# Patient Record
Sex: Female | Born: 1935 | Race: White | Hispanic: No | Marital: Single | Smoking: Light tobacco smoker
Health system: Southern US, Community
[De-identification: ages and names within clinical notes are randomized; demographics above are authoritative.]

## PROBLEM LIST (undated history)

## (undated) DIAGNOSIS — E079 Disorder of thyroid, unspecified: Secondary | ICD-10-CM

## (undated) DIAGNOSIS — I10 Essential (primary) hypertension: Secondary | ICD-10-CM

## (undated) DIAGNOSIS — E7800 Pure hypercholesterolemia, unspecified: Secondary | ICD-10-CM

## (undated) HISTORY — DX: Essential (primary) hypertension: I10

## (undated) HISTORY — DX: Disorder of thyroid, unspecified: E07.9

## (undated) HISTORY — PX: GALLBLADDER SURGERY: SHX652

## (undated) HISTORY — DX: Pure hypercholesterolemia, unspecified: E78.00

---

## 2005-08-05 ENCOUNTER — Ambulatory Visit: Payer: Self-pay | Admitting: Cardiovascular Disease

## 2005-09-09 ENCOUNTER — Ambulatory Visit (HOSPITAL_COMMUNITY): Admission: RE | Admit: 2005-09-09 | Discharge: 2005-09-09 | Payer: Self-pay | Admitting: *Deleted

## 2005-12-17 ENCOUNTER — Ambulatory Visit: Payer: Self-pay | Admitting: Internal Medicine

## 2006-01-01 ENCOUNTER — Ambulatory Visit: Payer: Self-pay | Admitting: Internal Medicine

## 2006-04-24 ENCOUNTER — Inpatient Hospital Stay: Payer: Self-pay | Admitting: Endocrinology

## 2006-04-25 ENCOUNTER — Other Ambulatory Visit: Payer: Self-pay

## 2006-07-18 ENCOUNTER — Ambulatory Visit: Payer: Self-pay | Admitting: *Deleted

## 2007-01-13 ENCOUNTER — Ambulatory Visit: Payer: Self-pay | Admitting: Internal Medicine

## 2007-07-31 ENCOUNTER — Ambulatory Visit: Payer: Self-pay | Admitting: Cardiovascular Disease

## 2008-02-10 ENCOUNTER — Ambulatory Visit: Payer: Self-pay | Admitting: Internal Medicine

## 2008-09-20 ENCOUNTER — Ambulatory Visit: Payer: Self-pay | Admitting: Cardiovascular Disease

## 2009-01-20 ENCOUNTER — Inpatient Hospital Stay: Payer: Self-pay | Admitting: Internal Medicine

## 2009-11-15 ENCOUNTER — Ambulatory Visit: Payer: Self-pay | Admitting: Gastroenterology

## 2009-11-20 LAB — PATHOLOGY REPORT

## 2009-12-01 ENCOUNTER — Ambulatory Visit: Payer: Self-pay | Admitting: Emergency Medicine

## 2009-12-08 ENCOUNTER — Inpatient Hospital Stay: Payer: Self-pay | Admitting: Emergency Medicine

## 2010-03-02 ENCOUNTER — Ambulatory Visit: Payer: Self-pay | Admitting: Internal Medicine

## 2010-03-04 ENCOUNTER — Ambulatory Visit: Payer: Self-pay | Admitting: Internal Medicine

## 2010-03-07 ENCOUNTER — Ambulatory Visit: Payer: Self-pay | Admitting: Internal Medicine

## 2010-04-04 ENCOUNTER — Ambulatory Visit: Payer: Self-pay | Admitting: Internal Medicine

## 2010-05-03 ENCOUNTER — Ambulatory Visit: Payer: Self-pay | Admitting: Internal Medicine

## 2010-06-03 ENCOUNTER — Ambulatory Visit: Payer: Self-pay | Admitting: Internal Medicine

## 2010-07-03 ENCOUNTER — Ambulatory Visit: Payer: Self-pay | Admitting: Internal Medicine

## 2010-08-03 ENCOUNTER — Ambulatory Visit: Payer: Self-pay | Admitting: Internal Medicine

## 2010-09-02 ENCOUNTER — Ambulatory Visit: Payer: Self-pay | Admitting: Internal Medicine

## 2010-10-03 ENCOUNTER — Ambulatory Visit: Payer: Self-pay | Admitting: Internal Medicine

## 2010-11-14 IMAGING — CR DG CHEST 2V
1 series · 2 of 2 positions shown · non-contrast
Comparison: none

REASON FOR EXAM: increasing shortness of breath
COMMENTS:

[Series 1: view not recorded · 0.17mm/px · 2 of 2 slices shown]
[im 1/2]
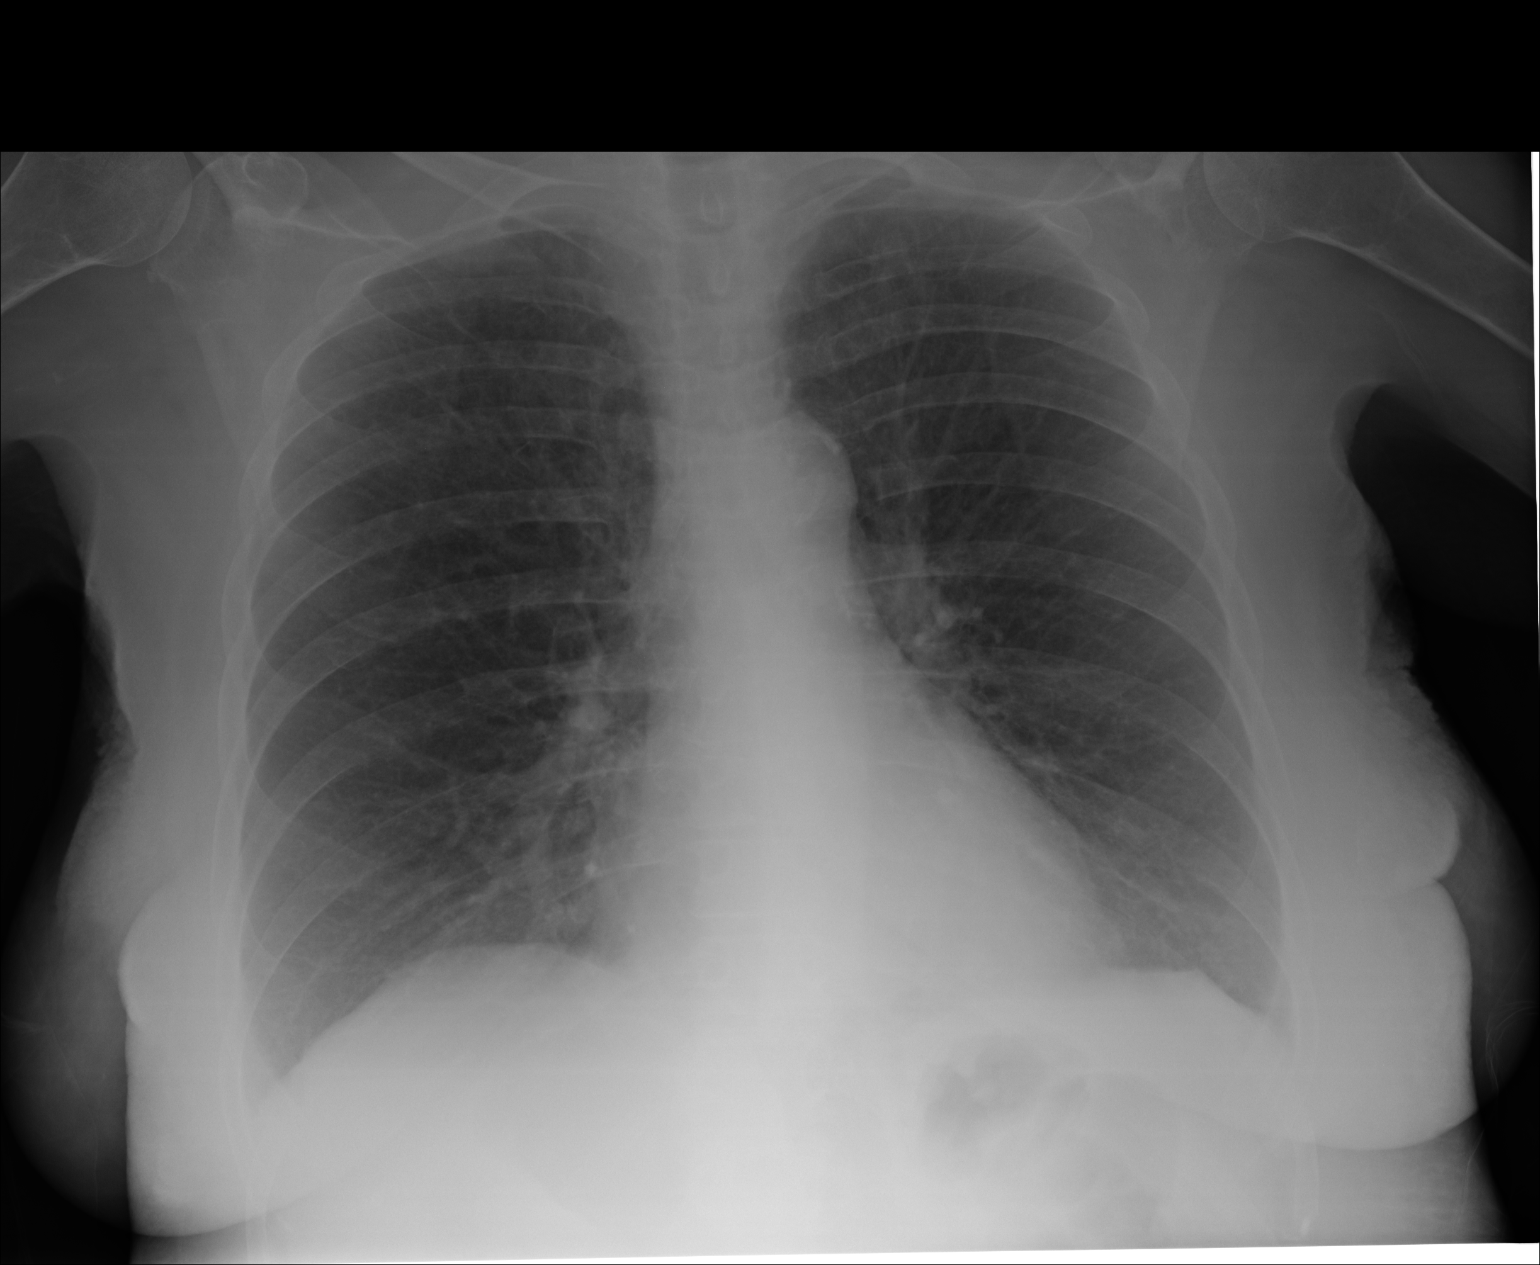
[im 2/2]
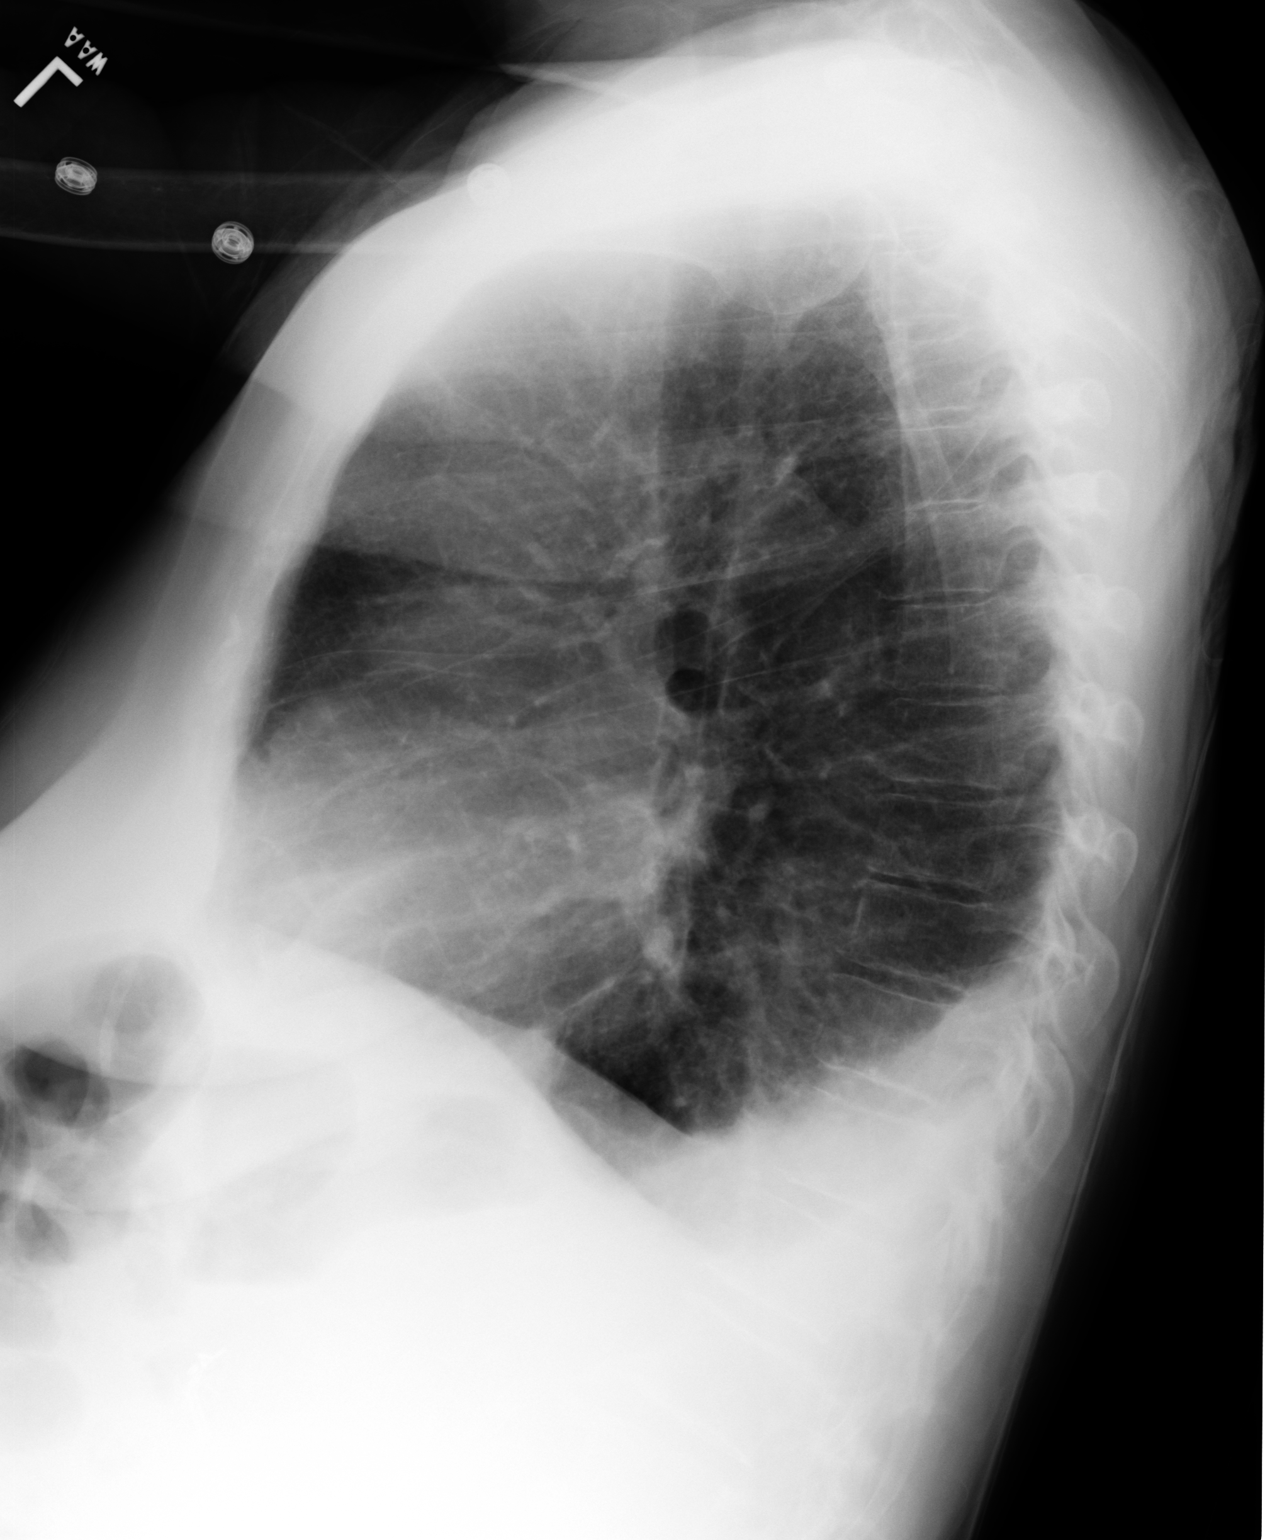

[2 of 2 positions shown; findings below may reference images not displayed]

PROCEDURE:     DXR - DXR CHEST PA (OR AP) AND LATERAL  - December 19, 2009 [DATE]

RESULT:     The lung fields are clear of infiltrate. In the lateral view
there is noted blunting of the posterior gutters bilaterally compatible with
bibasilar pleural effusions. The etiology for the effusion is not
identified. No pulmonary edema is seen. Heart size is normal. The chest is
hyperexpanded compatible with a history of COPD.
IMPRESSION: 1. There are noted bibasilar pleural effusions which blunt the posterior
gutters in the lateral view.
2. The etiology for the effusions is not identified.
3. COPD.

## 2010-11-15 IMAGING — CR DG CHEST 1V PORT
1 series · 1 of 1 positions shown · non-contrast
Comparison: none

REASON FOR EXAM: pleural effusion
COMMENTS:

[view not recorded]
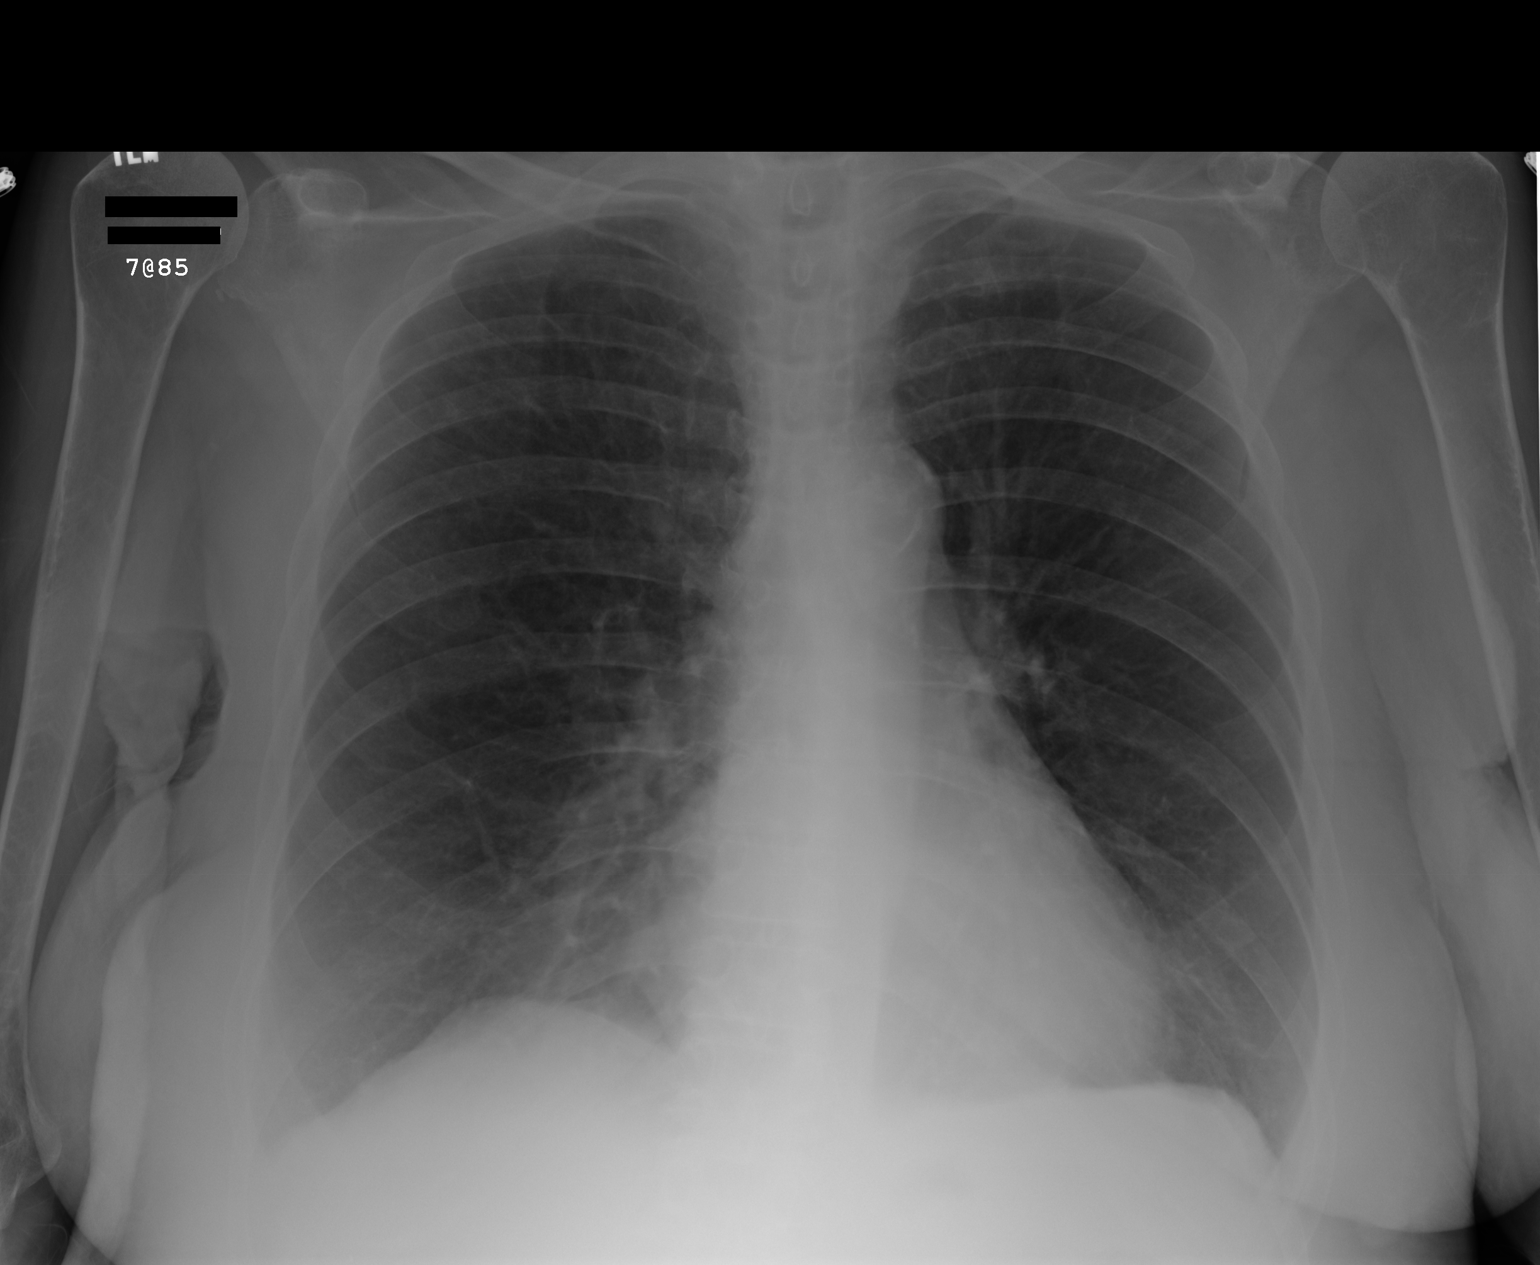

[1 of 1 positions shown; findings below may reference images not displayed]

PROCEDURE:     DXR - DXR PORTABLE CHEST SINGLE VIEW  - December 20, 2009  [DATE]

RESULT:     Comparison is made to a prior study dated 12/19/2009.

There is no evidence of focal infiltrates or regions of consolidation. There
is minimal blunting of the right costophrenic angle. The cardiac silhouette
is within normal limits. The visualized bony skeleton is unremarkable.
IMPRESSION: 1.     Blunting of the right cardiophrenic angle which is minimal. This may
represent the sequela of a small pleural effusion.
2.     Otherwise, no focal regions of consolidation or focal infiltrates.

## 2010-11-28 ENCOUNTER — Ambulatory Visit: Payer: Self-pay | Admitting: Internal Medicine

## 2010-12-03 ENCOUNTER — Ambulatory Visit: Payer: Self-pay | Admitting: Internal Medicine

## 2011-06-04 ENCOUNTER — Ambulatory Visit: Payer: Self-pay | Admitting: Ophthalmology

## 2011-06-12 ENCOUNTER — Ambulatory Visit: Payer: Self-pay | Admitting: Ophthalmology

## 2011-08-13 ENCOUNTER — Ambulatory Visit: Payer: Self-pay | Admitting: Ophthalmology

## 2011-08-13 LAB — POTASSIUM: Potassium: 3.7 mmol/L (ref 3.5–5.1)

## 2011-08-27 ENCOUNTER — Ambulatory Visit: Payer: Self-pay | Admitting: Ophthalmology

## 2013-08-11 ENCOUNTER — Encounter: Payer: Self-pay | Admitting: Podiatry

## 2013-08-11 ENCOUNTER — Ambulatory Visit (INDEPENDENT_AMBULATORY_CARE_PROVIDER_SITE_OTHER): Payer: Medicare Other | Admitting: Podiatry

## 2013-08-11 VITALS — BP 93/60 | HR 71 | Resp 16 | Ht 63.0 in | Wt 125.0 lb

## 2013-08-11 DIAGNOSIS — L600 Ingrowing nail: Secondary | ICD-10-CM

## 2013-08-11 MED ORDER — NEOMYCIN-POLYMYXIN-HC 3.5-10000-1 OT SOLN: OTIC | Status: DC

## 2013-08-11 NOTE — Patient Instructions (Signed)

## 2013-08-11 NOTE — Progress Notes (Signed)
   Subjective:    Patient ID: Bianca Horton, female    DOB: [birth year], 78 y.o.   MRN: [id number]  HPI Comments: Both great toenails are ingrown i think. They dont bother me now in the summer cause i wear open toed shoes. They about killed me this winter      Review of Systems  All other systems reviewed and are negative.      Objective:   Physical Exam: I have reviewed her past medical history medications allergies surgeries social history and review of systems. Pulses are strongly palpable bilateral. Muscle strength is 5 over 5 dorsiflexors plantar flexors inverters everters all intrinsic musculature is intact. Orthopedic evaluation demonstrates all joints distal to the ankle a full range of motion without crepitation. Cutaneous evaluation demonstrates sharp incurvated nail margin along the tibial border of the hallux bilateral.        Assessment & Plan:  Assessment: Ingrown nail paronychia abscess hallux bilateral.  Plan: Discussed etiology pathology conservative versus surgical therapies at this point in time chemical matrixectomy was performed to the tibial border of the hallux bilaterally. She tolerated this procedure well and local anesthetic and she will start treating the foot tomorrow by soaking it and applying Cortisporin Otic which was prescribed. I will followup with her in one week should she have questions concerns she'll notify us immediately.

## 2013-08-18 ENCOUNTER — Ambulatory Visit (INDEPENDENT_AMBULATORY_CARE_PROVIDER_SITE_OTHER): Payer: Medicare Other | Admitting: Podiatry

## 2013-08-18 VITALS — BP 108/63 | HR 81 | Resp 16

## 2013-08-18 DIAGNOSIS — L600 Ingrowing nail: Secondary | ICD-10-CM

## 2013-08-18 NOTE — Progress Notes (Signed)
She presents today one week status post matrixectomy hallux bilateral. She continues to soak in Betadine and water until today were she soak in Epsom salts warm water. She continues to apply the Cortisporin Otic a regular basis. And she has no pain and no drainage.  Objective: Signs are stable she is alert and oriented x3 there is no erythema edema saline is drainage or odor both margins hallux demonstrates no signs of infection and appears to be healing quite nicely.  Assessment: Well-healing surgical matrixectomy hallux bilateral.  Plan: Discontinue Betadine start with Epsom salts warm water soaks continue to soak on a twice a day basis apply Cortisporin Otic twice a day covered in the day and leave open at night continue to soak and to completely healed.

## 2014-06-26 NOTE — Op Note (Signed)
PATIENT NAME:  Bianca Horton, Bianca Horton DATE OF BIRTH:  05-15-1935  DATE OF PROCEDURE:  06/12/2011  PREOPERATIVE DIAGNOSIS: Visually significant cataract of the left eye.   POSTOPERATIVE DIAGNOSIS: Visually significant cataract of the left eye.   OPERATIVE PROCEDURE: Cataract extraction by phacoemulsification with implant of intraocular lens to left eye.   SURGEON: Galen ManilaWilliam Lasya Vetter, MD.   ANESTHESIA:  1. Managed anesthesia care.  2. Topical tetracaine drops followed by 2% Xylocaine jelly applied in the preoperative holding area.   COMPLICATIONS: None.   TECHNIQUE:  Stop and chop.   DESCRIPTION OF PROCEDURE: The patient was examined and consented in the preoperative holding area where the aforementioned topical anesthesia was applied to the left eye and then brought back to the Operating Room where the left eye was prepped and draped in the usual sterile ophthalmic fashion and a lid speculum was placed. A paracentesis was created with the side port blade and the anterior chamber was filled with viscoelastic. A near clear corneal incision was performed with the steel keratome. A continuous curvilinear capsulorrhexis was performed with a cystotome followed by the capsulorrhexis forceps. Hydrodissection and hydrodelineation were carried out with BSS on a blunt cannula. The lens was removed in a stop and chop technique and the remaining cortical material was removed with the irrigation-aspiration handpiece. The capsular bag was inflated with viscoelastic and the Technis ZCB00 22.5-diopter lens, serial number [id number] was placed in the capsular bag without complication. The remaining viscoelastic was removed from the eye with the irrigation-aspiration handpiece. The wounds were hydrated. The anterior chamber was flushed with Miostat and the eye was inflated to physiologic pressure. The wounds were found to be water tight. The eye was dressed with Vigamox. The patient was given protective  glasses to wear throughout the day and a shield with which to sleep tonight. The patient was also given drops with which to begin a drop regimen today and will follow-up with me in one day.   ____________________________ Jerilee FieldWilliam L. Ayaansh Smail, MD wlp:cms D: 06/12/2011 11:34:53 ET T: 06/12/2011 12:00:09 ET JOB#: [id number]  cc: Brayla Pat L. Demontae Antunes, MD, <Dictator> Jerilee FieldWILLIAM L Mikai Meints MD ELECTRONICALLY SIGNED 06/18/2011 12:25

## 2014-06-26 NOTE — Op Note (Signed)
PATIENT NAME:  Bianca ShipperJAMES, [name] MR#:  [id number] DATE OF BIRTH:  1935/05/21  DATE OF PROCEDURE:  08/27/2011  PREOPERATIVE DIAGNOSIS: Visually significant cataract of the right eye.   POSTOPERATIVE DIAGNOSIS: Visually significant cataract of the right eye.   OPERATIVE PROCEDURE: Cataract extraction by phacoemulsification with implant of intraocular lens to right eye.   SURGEON: Galen ManilaWilliam Nylia Gavina, MD.   ANESTHESIA:  1. Managed anesthesia care.  2. Topical tetracaine drops followed by 2% Xylocaine jelly applied in the preoperative holding area.   COMPLICATIONS: None.   TECHNIQUE:  Stop-and-chop    DESCRIPTION OF PROCEDURE: The patient was examined and consented in the preoperative holding area where the aforementioned topical anesthesia was applied to the right eye and then brought back to the Operating Room where the right eye was prepped and draped in the usual sterile ophthalmic fashion and a lid speculum was placed. A paracentesis was created with the side port blade and the anterior chamber was filled with viscoelastic. A near clear corneal incision was performed with the steel keratome. A continuous curvilinear capsulorrhexis was performed with a cystotome followed by the capsulorrhexis forceps. Hydrodissection and hydrodelineation were carried out with BSS on a blunt cannula. The lens was removed in a stop-and-chop technique and the remaining cortical material was removed with the irrigation-aspiration handpiece. The capsular bag was inflated with viscoelastic and the Technus ZCB00 21.0-diopter lens, serial number [id number] was placed in the capsular bag without complication. The remaining viscoelastic was removed from the eye with the irrigation-aspiration handpiece. The wounds were hydrated. The anterior chamber was flushed with Miostat and the eye was inflated to physiologic pressure. The wounds were found to be water tight. The eye was dressed with Vigamox. The patient was given protective  glasses to wear throughout the day and a shield with which to sleep tonight. The patient was also given drops with which to begin a drop regimen today and will follow-up with me in one day.   ____________________________ Jerilee FieldWilliam L. Vyla Pint, MD wlp:drc D: 08/27/2011 11:20:05 ET T: 08/27/2011 11:32:54 ET JOB#: [id number]  cc: Kirsi Hugh L. Dragan Tamburrino, MD, <Dictator> Jerilee FieldWILLIAM L Mahika Vanvoorhis MD ELECTRONICALLY SIGNED 08/28/2011 15:07

## 2014-09-19 ENCOUNTER — Ambulatory Visit: Payer: Self-pay | Admitting: Cardiovascular Disease

## 2015-11-24 ENCOUNTER — Ambulatory Visit (INDEPENDENT_AMBULATORY_CARE_PROVIDER_SITE_OTHER): Payer: Medicare Other | Admitting: Podiatry

## 2015-12-01 ENCOUNTER — Ambulatory Visit (INDEPENDENT_AMBULATORY_CARE_PROVIDER_SITE_OTHER): Payer: Medicare Other | Admitting: Podiatry

## 2015-12-01 ENCOUNTER — Encounter: Payer: Self-pay | Admitting: Podiatry

## 2015-12-01 VITALS — BP 184/93 | HR 69 | Resp 16

## 2015-12-01 DIAGNOSIS — S90221A Contusion of right lesser toe(s) with damage to nail, initial encounter: Secondary | ICD-10-CM | POA: Diagnosis not present

## 2015-12-01 DIAGNOSIS — B351 Tinea unguium: Secondary | ICD-10-CM

## 2015-12-01 DIAGNOSIS — L603 Nail dystrophy: Secondary | ICD-10-CM | POA: Diagnosis not present

## 2015-12-01 DIAGNOSIS — M7989 Other specified soft tissue disorders: Secondary | ICD-10-CM

## 2015-12-01 DIAGNOSIS — M79661 Pain in right lower leg: Secondary | ICD-10-CM

## 2015-12-01 DIAGNOSIS — M79674 Pain in right toe(s): Secondary | ICD-10-CM

## 2015-12-01 NOTE — Progress Notes (Signed)
Subjective: Patient presents today for trauma to the right great toenail. Patient states that she does not know what happened but she noticed that her right big toe became painful and swollen with the nail partially detached.  Patient states that today upon presentation the toenail actually appears to be good and there is no pain regarding her right great toe or toenail.   Objective: Physical Exam General: The patient is alert and oriented x3 in no acute distress.  Dermatology: Skin is warm, dry and supple bilateral lower extremities. Negative for open lesions or macerations.  Vascular: Palpable pedal pulses bilaterally. No edema or erythema noted. Capillary refill within normal limits.  Neurological: Epicritic and protective threshold grossly intact bilaterally.   Musculoskeletal Exam: Minimal pain on palpation to the right great toe with mild erythema and edema noted. Possible subungual hematoma noted to the right great toenail.  Range of motion within normal limits to all pedal and ankle joints bilateral. Muscle strength 5/5 in all groups bilateral.    Assessment: #1 trauma to the right great toenail. #2 edema with erythema right great toe #3 pain in right great toe #4 onychomycosis bilateral nails.  Problem List Items Addressed This Visit    None    Visit Diagnoses   None.       Plan of Care:  #1 Patient was evaluated. #2 Today the nail appears to be somewhat healthy and the patient is no longer in pain regarding the right great toe. #3 the nail was filed down using a rotary bur down to smooth contour. #4 patient is to return to clinic on a when necessary basis recommend daily application of antibiotic ointment and a Band-Aid.     Dr. Felecia ShellingBrent M. Valrie Jia, DPM [facility]

## 2019-08-23 ENCOUNTER — Ambulatory Visit (INDEPENDENT_AMBULATORY_CARE_PROVIDER_SITE_OTHER): Payer: Medicare Other | Admitting: Nurse Practitioner

## 2019-08-23 ENCOUNTER — Encounter (INDEPENDENT_AMBULATORY_CARE_PROVIDER_SITE_OTHER): Payer: Self-pay | Admitting: Nurse Practitioner

## 2019-08-23 ENCOUNTER — Other Ambulatory Visit: Payer: Self-pay

## 2019-08-23 VITALS — BP 97/62 | HR 81 | Resp 16 | Ht 63.0 in | Wt 138.6 lb

## 2019-08-23 DIAGNOSIS — I739 Peripheral vascular disease, unspecified: Secondary | ICD-10-CM | POA: Diagnosis not present

## 2019-08-23 DIAGNOSIS — F17200 Nicotine dependence, unspecified, uncomplicated: Secondary | ICD-10-CM

## 2019-08-23 DIAGNOSIS — I10 Essential (primary) hypertension: Secondary | ICD-10-CM

## 2019-08-23 DIAGNOSIS — M7989 Other specified soft tissue disorders: Secondary | ICD-10-CM

## 2019-08-23 DIAGNOSIS — IMO0001 Reserved for inherently not codable concepts without codable children: Secondary | ICD-10-CM

## 2019-08-23 DIAGNOSIS — I701 Atherosclerosis of renal artery: Secondary | ICD-10-CM

## 2019-08-26 ENCOUNTER — Encounter (INDEPENDENT_AMBULATORY_CARE_PROVIDER_SITE_OTHER): Payer: Self-pay | Admitting: Nurse Practitioner

## 2019-08-29 NOTE — Progress Notes (Signed)
Subjective:    Patient ID: Bianca Horton, female    DOB: Nov 17, 1935, 84 y.o.   MRN: [id number] Chief Complaint  Patient presents with  . New Patient (Initial Visit)    ref for ble swelling    Patient is seen for evaluation of leg pain and leg swelling. The patient first noticed the swelling remotely. The swelling is associated with pain and discoloration. The pain and swelling worsens with prolonged dependency and improves with elevation. The pain is unrelated to activity.  The patient notes that in the morning the legs are significantly improved but they steadily worsened throughout the course of the day. The patient also notes a steady worsening of the discoloration in the ankle and shin area.   The patient endorses weakness in the lower extremities.  She notes that she cannot walk far without her legs giving out.  The patient notes that approximately 10 years ago she had a blockage in her leg with a stent placed with repair to the renal arteries as well.  Elevation makes the leg symptoms better, dependency makes them much worse. There is no history of ulcerations. The patient denies any recent changes in medications.  The patient has not been wearing graduated compression.  The patient denies a history of DVT or PE. There is no prior history of phlebitis. There is no history of primary lymphedema.  No history of malignancies. No history of trauma or groin or pelvic surgery. There is no history of radiation treatment to the groin or pelvis  The patient denies amaurosis fugax or recent TIA symptoms. There are no recent neurological changes noted. The patient denies recent episodes of angina or shortness of breath   Review of Systems  Cardiovascular: Positive for leg swelling.  Neurological: Positive for weakness.  All other systems reviewed and are negative.      Objective:   Physical Exam Vitals reviewed.  HENT:     Head: Normocephalic.  Cardiovascular:     Rate and  Rhythm: Normal rate and regular rhythm.     Pulses: Decreased pulses.  Pulmonary:     Effort: Pulmonary effort is normal.     Breath sounds: Normal breath sounds.  Musculoskeletal:     Right lower leg: 3+ Pitting Edema present.     Left lower leg: 3+ Pitting Edema present.  Neurological:     Mental Status: She is alert and oriented to person, place, and time.     Motor: Weakness present.  Psychiatric:        Mood and Affect: Mood normal.        Behavior: Behavior normal.        Thought Content: Thought content normal.        Judgment: Judgment normal.     BP 97/62 (BP Location: Right Arm)   Pulse 81   Resp 16   Ht 5\' 3"  (1.6 m)   Wt 138 lb 9.6 oz (62.9 kg)   BMI 24.55 kg/m   Past Medical History:  Diagnosis Date  . High cholesterol   . Hypertension   . Thyroid disease     Social History   Socioeconomic History  . Marital status: Single    Spouse name: Not on file  . Number of children: Not on file  . Years of education: Not on file  . Highest education level: Not on file  Occupational History  . Not on file  Tobacco Use  . Smoking status: Light Tobacco Smoker  . Smokeless  tobacco: Never Used  . Tobacco comment: process of quitting   Substance and Sexual Activity  . Alcohol use: Never  . Drug use: Never  . Sexual activity: Not on file  Other Topics Concern  . Not on file  Social History Narrative  . Not on file   Social Determinants of Health   Financial Resource Strain:   . Difficulty of Paying Living Expenses:   Food Insecurity:   . Worried About Charity fundraiser in the Last Year:   . Arboriculturist in the Last Year:   Transportation Needs:   . Film/video editor (Medical):   Marland Kitchen Lack of Transportation (Non-Medical):   Physical Activity:   . Days of Exercise per Week:   . Minutes of Exercise per Session:   Stress:   . Feeling of Stress :   Social Connections:   . Frequency of Communication with Friends and Family:   . Frequency of  Social Gatherings with Friends and Family:   . Attends Religious Services:   . Active Member of Clubs or Organizations:   . Attends Archivist Meetings:   Marland Kitchen Marital Status:   Intimate Partner Violence:   . Fear of Current or Ex-Partner:   . Emotionally Abused:   Marland Kitchen Physically Abused:   . Sexually Abused:     Past Surgical History:  Procedure Laterality Date  . GALLBLADDER SURGERY      History reviewed. No pertinent family history.  No Known Allergies     Assessment & Plan:   1. PAD (peripheral artery disease) (HCC) It is very possible the patient's worsening weakness is related to her peripheral arterial disease.  Per independent review of the patient's outside records it appears that she did have a area of stenosis in the left upper extremity however the notes are not clear as to whether it was a vein or artery.  The patient is not entirely sure herself.  We will perform noninvasive studies to evaluate the patient's arterial status. - VAS [place] ABI WITH/WO TBI; Future  2. Leg swelling I have had a long discussion with the patient regarding swelling and why it  causes symptoms.  Patient will begin wearing graduated compression stockings class 1 (20-30 mmHg) on a daily basis a prescription was given. The patient will  beginning wearing the stockings first thing in the morning and removing them in the evening. The patient is instructed specifically not to sleep in the stockings.   In addition, behavioral modification will be initiated.  This will include frequent elevation, use of over the counter pain medications and exercise such as walking.  I have reviewed systemic causes for chronic edema such as liver, kidney and cardiac etiologies.  The patient denies problems with these organ systems.    Consideration for a lymph pump will also be made based upon the effectiveness of conservative therapy.  This would help to improve the edema control and prevent sequela such as ulcers  and infections   Patient should undergo duplex ultrasound of the venous system to ensure that DVT or reflux is not present.  The patient will follow-up with me after the ultrasound.   - VAS [place] LOWER EXTREMITY VENOUS REFLUX; Future  3. Renal artery stenosis (HCC) Currently the patient's blood pressure is adequately controlled however she does have a history of chronic kidney disease.  Once we have examined the cause of the patient's leg pain and swelling we will shift her focus to evaluating the  status of her renal artery stenosis.  4. Essential hypertension Continue antihypertensive medications as already ordered, these medications have been reviewed and there are no changes at this time.   5. Smoking Smoking cessation was discussed, 3-10 minutes spent on this topic specifically    Current Outpatient Medications on File Prior to Visit  Medication Sig Dispense Refill  . aspirin 325 MG EC tablet Take 325 mg by mouth daily.    Marland Kitchen buPROPion (WELLBUTRIN XL) 150 MG 24 hr tablet     . carvedilol (COREG) 25 MG tablet     . cilostazol (PLETAL) 100 MG tablet     . cloNIDine (CATAPRES) 0.1 MG tablet     . levothyroxine (SYNTHROID) 112 MCG tablet Take 112 mcg by mouth daily.    . Multiple Vitamins-Minerals (PRESERVISION/LUTEIN) CAPS Take by mouth.    . simvastatin (ZOCOR) 40 MG tablet     . cloNIDine (CATAPRES) 0.3 MG tablet  (Patient not taking: Reported on 08/23/2019)    . furosemide (LASIX) 20 MG tablet  (Patient not taking: Reported on 08/23/2019)    . levothyroxine (SYNTHROID, LEVOTHROID) 100 MCG tablet  (Patient not taking: Reported on 08/23/2019)    . losartan-hydrochlorothiazide (HYZAAR) 100-25 MG per tablet  (Patient not taking: Reported on 08/23/2019)    . neomycin-polymyxin-hydrocortisone (CORTISPORIN) otic solution 1-2  Drops to the toe after soaking twice daily (Patient not taking: Reported on 12/01/2015) 10 mL 1   No current facility-administered medications on file prior to visit.     There are no Patient Instructions on file for this visit. No follow-ups on file.   Georgiana Spinner, NP

## 2019-09-10 ENCOUNTER — Ambulatory Visit (INDEPENDENT_AMBULATORY_CARE_PROVIDER_SITE_OTHER): Payer: Medicare Other | Admitting: Nurse Practitioner

## 2019-09-10 ENCOUNTER — Encounter (INDEPENDENT_AMBULATORY_CARE_PROVIDER_SITE_OTHER): Payer: Medicare Other

## 2019-09-11 ENCOUNTER — Emergency Department
Admission: EM | Admit: 2019-09-11 | Discharge: 2019-09-11 | Disposition: A | Discharge status: Home / Self Care | Payer: Medicare Other | Attending: Emergency Medicine | Admitting: Emergency Medicine

## 2019-09-11 ENCOUNTER — Other Ambulatory Visit: Payer: Self-pay

## 2019-09-11 ENCOUNTER — Emergency Department: Payer: Medicare Other

## 2019-09-11 DIAGNOSIS — Y929 Unspecified place or not applicable: Secondary | ICD-10-CM | POA: Insufficient documentation

## 2019-09-11 DIAGNOSIS — M79604 Pain in right leg: Secondary | ICD-10-CM | POA: Insufficient documentation

## 2019-09-11 DIAGNOSIS — F17200 Nicotine dependence, unspecified, uncomplicated: Secondary | ICD-10-CM | POA: Insufficient documentation

## 2019-09-11 DIAGNOSIS — N189 Chronic kidney disease, unspecified: Secondary | ICD-10-CM | POA: Insufficient documentation

## 2019-09-11 DIAGNOSIS — Z79899 Other long term (current) drug therapy: Secondary | ICD-10-CM | POA: Insufficient documentation

## 2019-09-11 DIAGNOSIS — I739 Peripheral vascular disease, unspecified: Secondary | ICD-10-CM | POA: Diagnosis not present

## 2019-09-11 DIAGNOSIS — X58XXXA Exposure to other specified factors, initial encounter: Secondary | ICD-10-CM | POA: Insufficient documentation

## 2019-09-11 DIAGNOSIS — Y999 Unspecified external cause status: Secondary | ICD-10-CM | POA: Diagnosis not present

## 2019-09-11 DIAGNOSIS — I129 Hypertensive chronic kidney disease with stage 1 through stage 4 chronic kidney disease, or unspecified chronic kidney disease: Secondary | ICD-10-CM | POA: Diagnosis not present

## 2019-09-11 DIAGNOSIS — Y939 Activity, unspecified: Secondary | ICD-10-CM | POA: Insufficient documentation

## 2019-09-11 DIAGNOSIS — M5416 Radiculopathy, lumbar region: Secondary | ICD-10-CM | POA: Diagnosis not present

## 2019-09-11 DIAGNOSIS — S8991XA Unspecified injury of right lower leg, initial encounter: Secondary | ICD-10-CM | POA: Diagnosis present

## 2019-09-11 DIAGNOSIS — E876 Hypokalemia: Secondary | ICD-10-CM | POA: Diagnosis not present

## 2019-09-11 DIAGNOSIS — S80811A Abrasion, right lower leg, initial encounter: Secondary | ICD-10-CM | POA: Diagnosis not present

## 2019-09-11 LAB — CBC WITH DIFFERENTIAL/PLATELET
Abs Immature Granulocytes: 0.02 10*3/uL (ref 0.00–0.07)
Basophils Absolute: 0 10*3/uL (ref 0.0–0.1)
Basophils Relative: 0 %
Eosinophils Absolute: 0.1 10*3/uL (ref 0.0–0.5)
Eosinophils Relative: 2 %
HCT: 27.9 % — ABNORMAL LOW (ref 36.0–46.0)
Hemoglobin: 9.8 g/dL — ABNORMAL LOW (ref 12.0–15.0)
Immature Granulocytes: 0 %
Lymphocytes Relative: 27 %
Lymphs Abs: 1.9 10*3/uL (ref 0.7–4.0)
MCH: 32.2 pg (ref 26.0–34.0)
MCHC: 35.1 g/dL (ref 30.0–36.0)
MCV: 91.8 fL (ref 80.0–100.0)
Monocytes Absolute: 0.8 10*3/uL (ref 0.1–1.0)
Monocytes Relative: 12 %
Neutro Abs: 4 10*3/uL (ref 1.7–7.7)
Neutrophils Relative %: 59 %
Platelets: 215 10*3/uL (ref 150–400)
RBC: 3.04 MIL/uL — ABNORMAL LOW (ref 3.87–5.11)
RDW: 12.9 % (ref 11.5–15.5)
WBC: 6.8 10*3/uL (ref 4.0–10.5)
nRBC: 0 % (ref 0.0–0.2)

## 2019-09-11 LAB — BASIC METABOLIC PANEL
Anion gap: 11 (ref 5–15)
BUN: 23 mg/dL (ref 8–23)
CO2: 25 mmol/L (ref 22–32)
Calcium: 10.1 mg/dL (ref 8.9–10.3)
Chloride: 97 mmol/L — ABNORMAL LOW (ref 98–111)
Creatinine, Ser: 2.01 mg/dL — ABNORMAL HIGH (ref 0.44–1.00)
GFR calc Af Amer: 26 mL/min — ABNORMAL LOW (ref >60)
GFR calc non Af Amer: 23 mL/min — ABNORMAL LOW (ref >60)
Glucose, Bld: 102 mg/dL — ABNORMAL HIGH (ref 70–99)
Potassium: 3 mmol/L — ABNORMAL LOW (ref 3.5–5.1)
Sodium: 133 mmol/L — ABNORMAL LOW (ref 135–145)

## 2019-09-11 LAB — PROTIME-INR
INR: 1 (ref 0.8–1.2)
Prothrombin Time: 13.2 seconds (ref 11.4–15.2)

## 2019-09-11 MED ORDER — TRAMADOL HCL 50 MG PO TABS: 50.0000 mg | ORAL_TABLET | Freq: Three times a day (TID) | ORAL | 0 refills | Status: AC | PRN

## 2019-09-11 MED ORDER — POTASSIUM CHLORIDE CRYS ER 20 MEQ PO TBCR
40.0000 meq | EXTENDED_RELEASE_TABLET | Freq: Once | ORAL | Status: AC
  Administered 2019-09-11: 40 meq via ORAL
  Filled 2019-09-11: qty 2

## 2019-09-11 MED ORDER — PREDNISONE 20 MG PO TABS
20.0000 mg | ORAL_TABLET | Freq: Two times a day (BID) | ORAL | 0 refills | Status: AC
Start: 2019-09-11 — End: 2019-09-16

## 2019-09-11 MED ORDER — TRAMADOL HCL 50 MG PO TABS
50.0000 mg | ORAL_TABLET | Freq: Once | ORAL | Status: AC
  Administered 2019-09-11: 50 mg via ORAL
  Filled 2019-09-11: qty 1

## 2019-09-11 MED ORDER — PREDNISONE 20 MG PO TABS
60.0000 mg | ORAL_TABLET | Freq: Once | ORAL | Status: AC
  Administered 2019-09-11: 60 mg via ORAL
  Filled 2019-09-11: qty 3

## 2019-09-11 MED ORDER — POTASSIUM CHLORIDE ER 10 MEQ PO TBCR: 10.0000 meq | EXTENDED_RELEASE_TABLET | Freq: Every day | ORAL | 0 refills | Status: DC

## 2019-09-11 NOTE — ED Triage Notes (Addendum)
Pt arrives via [facility] from home for c/o right leg pain x 1 month. Pt states she is having trouble bearing weight on the right leg so she called her PCP and asked for pain medication and was told they could not give her any and to come to the ER. PT A&Ox4 and in NAD, skin warm and dry. Swelling noted to the left foot and ankle. Pt reports she slipped while going up the stairs last month but states the leg was hurting before this injury

## 2019-09-11 NOTE — ED Provider Notes (Signed)
[facility] Emergency Department Provider Note ____________________________________________  Time seen: 59  I have reviewed the triage vital signs and the nursing notes.  HISTORY  Chief Complaint  Leg Pain  HPI Bianca Horton is a 84 y.o. female presents to the ED  via EMS from home with complaints of intermittent right leg pain for the last month.  Patient reports when the pain is there she has trouble bearing weight on the right leg.  She called her PCP fast blood prescription for pain medicine, and was told to come to the ED for evaluation.  Patient denies any recent falls, but does admit to a fall about a week ago versus getting her leg concrete steps.  Denies any head injury or loss of consciousness.  Patient lives independently at home, uses a walker to ambulate.  She reports the pain has kept her less active as of late.  She does have a history of chronic kidney disease, hypertension, thyroid disease, and peripheral vascular disease.  She was recently evaluated by vascular surgery, and was to have an ultrasound performed of the lower extremities with ABIs.  Patient admittedly did not follow-up as planned, and is not had any other interventions by vascular.  She denies any bladder bowel incontinence, foot drop, or saddle anesthesias.  She also denies any chest pain, shortness of breath, fever, chills, sweats.  Patient reports some swelling to the ankles, but reports that is not the reason for her pain, no other reason for her presenting today.  Past Medical History:  Diagnosis Date  . High cholesterol   . Hypertension   . Thyroid disease     There are no problems to display for this patient.   Past Surgical History:  Procedure Laterality Date  . GALLBLADDER SURGERY      Prior to Admission medications   Medication Sig Start Date End Date Taking? Authorizing Provider  aspirin 325 MG EC tablet Take 325 mg by mouth daily.    [provider]   buPROPion (WELLBUTRIN XL) 150 MG 24 hr tablet  11/27/15   [provider]  carvedilol (COREG) 25 MG tablet  07/22/13   [provider]  cilostazol (PLETAL) 100 MG tablet  07/22/13   [provider]  cloNIDine (CATAPRES) 0.1 MG tablet  11/27/15   [provider]  cloNIDine (CATAPRES) 0.3 MG tablet  07/22/13   [provider]  furosemide (LASIX) 20 MG tablet  11/27/15   [provider]  levothyroxine (SYNTHROID) 112 MCG tablet Take 112 mcg by mouth daily. 05/07/19   [provider]  levothyroxine (SYNTHROID, LEVOTHROID) 100 MCG tablet  07/22/13   [provider]  losartan-hydrochlorothiazide Mauri Reading) 100-25 MG per tablet  07/22/13   [provider]  Multiple Vitamins-Minerals (PRESERVISION/LUTEIN) CAPS Take by mouth.    [provider]  neomycin-polymyxin-hydrocortisone (CORTISPORIN) otic solution 1-2  Drops to the toe after soaking twice daily Patient not taking: Reported on 12/01/2015 08/11/13   Hyatt, Max T, DPM  potassium chloride (KLOR-CON) 10 MEQ tablet Take 1 tablet (10 mEq total) by mouth daily for 5 days. 09/11/19 09/16/19  Jawana Reagor, Charlesetta Ivory, PA-C  predniSONE (DELTASONE) 20 MG tablet Take 1 tablet (20 mg total) by mouth 2 (two) times daily with a meal for 5 days. 09/11/19 09/16/19  Charnell Peplinski, Charlesetta Ivory, PA-C  simvastatin (ZOCOR) 40 MG tablet  07/22/13   [provider]  traMADol (ULTRAM) 50 MG tablet Take 1 tablet (50 mg total) by  mouth 3 (three) times daily as needed for up to 7 days. 09/11/19 09/18/19  Marabelle Cushman, Charlesetta Ivory, PA-C    Allergies Patient has no known allergies.  History reviewed. No pertinent family history.  Social History Social History   Tobacco Use  . Smoking status: Light Tobacco Smoker  . Smokeless tobacco: Never Used  . Tobacco comment: process of quitting   Substance Use Topics  . Alcohol use: Never  . Drug use: Never    Review of Systems  Constitutional:  Negative for fever. Cardiovascular: Negative for chest pain. Respiratory: Negative for shortness of breath. Gastrointestinal: Negative for abdominal pain, vomiting and diarrhea. Genitourinary: Negative for dysuria. Musculoskeletal: Negative for back pain. Right leg pain as above Skin: Negative for rash. Neurological: Negative for headaches, focal weakness or numbness. ____________________________________________  PHYSICAL EXAM:  VITAL SIGNS: ED Triage Vitals [09/11/19 1755]  Enc Vitals Group     BP 138/81     Pulse Rate 78     Resp 18     Temp 98.3 F (36.8 C)     Temp Source Oral     SpO2 97 %     Weight 130 lb (59 kg)     Height 5\' 3"  (1.6 m)     Head Circumference      Peak Flow      Pain Score 10     Pain Loc      Pain Edu?      Excl. in GC?     Constitutional: Alert and oriented. Well appearing and in no distress. Head: Normocephalic and atraumatic. Eyes: Conjunctivae are normal. Normal extraocular movements Cardiovascular: Normal rate, regular rhythm. Normal distal pulses and cap refill.  No CCE distally. Respiratory: Normal respiratory effort. No wheezes/rales/rhonchi. Gastrointestinal: Soft and nontender. No distention, rebound, guarding, or rigidity.  No CVA tenderness elicited.  Normoactive bowel sounds noted. Musculoskeletal: Normal spinal alignment without midline tenderness, spasm, deformity, or step-off.  Patient able demonstrate normal hip flexion the and knee flexion on exam.  Normal patella tracking.  No signs of tunnel derangement on the right.  Nontender with normal range of motion in all extremities.  Neurologic: Cranial nerves II through XII grossly intact.  Normal gait without ataxia. Normal speech and language. No gross focal neurologic deficits are appreciated. Skin:  Skin is warm, dry and intact. No rash noted.  Patient with an abrasion/skin tear to the anterior right shin. Psychiatric: Mood and affect are normal. Patient exhibits appropriate insight  and judgment. ____________________________________________   LABS (pertinent positives/negatives)  Labs Reviewed  BASIC METABOLIC PANEL - Abnormal; Notable for the following components:      Result Value   Sodium 133 (*)    Potassium 3.0 (*)    Chloride 97 (*)    Glucose, Bld 102 (*)    Creatinine, Ser 2.01 (*)    GFR calc non Af Amer 23 (*)    GFR calc Af Amer 26 (*)    All other components within normal limits  CBC WITH DIFFERENTIAL/PLATELET - Abnormal; Notable for the following components:   RBC 3.04 (*)    Hemoglobin 9.8 (*)    HCT 27.9 (*)    All other components within normal limits  PROTIME-INR  URINALYSIS, COMPLETE (UACMP) WITH MICROSCOPIC  ____________________________________________   RADIOLOGY  RLE Doppler  IMPRESSION: Negative.  DG Lumbar Spine.  IMPRESSION: 1. No definite acute fracture or vertebral body height loss. 2. Multilevel discogenic and facet degenerative changes. 3. Fusiform infrarenal abdominal aortic aneurysm  measuring up to 4.8 cm on this exam, likely increased from comparison in 2011. Consider further evaluation with ultrasound or CT angiography. 4. Additional extensive atherosclerotic plaque throughout the abdomen and pelvis. Small vascular stent in the left upper quadrant, likely fractured renal artery ____________________________________________  PROCEDURES  Ultram 50 mg p.o. x 2 Prednisone 60 mg PO K-dur 40 meq PO wound care  Procedures ____________________________________________  INITIAL IMPRESSION / ASSESSMENT AND PLAN / ED COURSE  DDX: lumbar radiculopathy, DDD/HNP, spinal compression fracture, DVT, hip fracture  Geriatric patient with ED evaluation of a 1 month complaint of intermittent right leg pain.  Patient is without fall preceding injury.  Exam is overall benign reassuring at this time.  Ultrasound study of the right lower extremity does not reveal any acute DVT or significant vascular deficiency.  X-ray of the  lumbar spine reveals multilevel DDD and facet arthropathy.  No acute compression fractures are appreciated.  Patient is a incidental finding of an increased AAA, previously seen on CT imaging.  Patient able to ambulate to the bathroom with standby assist.  She did report some increased leg cramping on the return trip.  Otherwise patient's clinical picture is likely consistent with lumbar radiculopathy as being underlying cause for her right leg pain.  She requested a dose of steroid, and this is appropriate given her symptoms we will treat her with prednisone, tramadol, and a few potassium pills for her mild hypokalemia.  Patient is encouraged to follow-up with vascular surgery for the reevaluation of her AAA.  She is also advised to see a primary provider for ongoing management of her right leg pain.  Return precautions have been reviewed.  Patient is discharged with a care of her adult niece at this time.  A request is also placed for home health evaluation for possible DME and home health aide assistance.  Bianca Horton was evaluated in Emergency Department on 09/11/2019 for the symptoms described in the history of present illness. She was evaluated in the context of the global COVID-19 pandemic, which necessitated consideration that the patient might be at risk for infection with the SARS-CoV-2 virus that causes COVID-19. Institutional protocols and algorithms that pertain to the evaluation of patients at risk for COVID-19 are in a state of rapid change based on information released by regulatory bodies including the CDC and federal and state organizations. These policies and algorithms were followed during the patient's care in the ED.  I reviewed the patient's prescription history over the last 12 months in the multi-state controlled substances database(s) that includes [place], [place], [place], [place], [place], [place], [place], [place], [place], [place], [place], [place],  [place], and [place].  Results were notable for no RX history. ____________________________________________  FINAL CLINICAL IMPRESSION(S) / ED DIAGNOSES  Final diagnoses:  Right leg pain  Lumbar radiculopathy      Karmen Stabs, Charlesetta Ivory, PA-C 09/11/19 2256    Chesley Noon, MD 09/14/19 1511

## 2019-09-11 NOTE — ED Triage Notes (Signed)
FIRST NURSE: Pt to ER via EMS with reports of leg pain for a month.  Called EMS because she could not walk on them any longer, EMS reports pt ambulatory in house.  Pt told EMS she just needs "some pain pills".

## 2019-09-11 NOTE — ED Notes (Signed)
Ultrasound at bedside

## 2019-09-11 NOTE — Discharge Instructions (Addendum)
Your exam, labs, [place], and XR are essentially normal at this time. Your leg pain is not due to a blood clot or DVT. You may be experiencing leg pain due to DDD and lumbar arthritis. Take the prescription steroid and pain medicine as directed. You are also being treated for low potassium. Your lumbar XR did show an increase in size of your abdominal aneurysm from 3.0 to 4.8 cm. You should follow-up with Dr. Wyn Quaker for further evaluation by [place] or CT angiogram. Return to the ED as needed.

## 2019-09-12 ENCOUNTER — Telehealth: Payer: Self-pay

## 2019-09-12 NOTE — Telephone Encounter (Signed)
Checking on patient. Niece answered the phone  She states that hte patient feels better and they will follow up with primary I asked if she needed me to help with any home health needs. She said not at this time. They were waiting to talk to a Child psychotherapist tomorrow. Marland Kitchen

## 2019-10-18 ENCOUNTER — Ambulatory Visit (INDEPENDENT_AMBULATORY_CARE_PROVIDER_SITE_OTHER): Payer: Medicare Other

## 2019-10-18 ENCOUNTER — Encounter (INDEPENDENT_AMBULATORY_CARE_PROVIDER_SITE_OTHER): Payer: Self-pay | Admitting: Vascular Surgery

## 2019-10-18 ENCOUNTER — Encounter (INDEPENDENT_AMBULATORY_CARE_PROVIDER_SITE_OTHER): Payer: Self-pay

## 2019-10-18 ENCOUNTER — Ambulatory Visit (INDEPENDENT_AMBULATORY_CARE_PROVIDER_SITE_OTHER): Payer: Medicare Other | Admitting: Vascular Surgery

## 2019-10-18 ENCOUNTER — Other Ambulatory Visit: Payer: Self-pay

## 2019-10-18 DIAGNOSIS — I714 Abdominal aortic aneurysm, without rupture, unspecified: Secondary | ICD-10-CM | POA: Insufficient documentation

## 2019-10-18 DIAGNOSIS — M8949 Other hypertrophic osteoarthropathy, multiple sites: Secondary | ICD-10-CM

## 2019-10-18 DIAGNOSIS — E782 Mixed hyperlipidemia: Secondary | ICD-10-CM | POA: Diagnosis not present

## 2019-10-18 DIAGNOSIS — M159 Polyosteoarthritis, unspecified: Secondary | ICD-10-CM

## 2019-10-18 DIAGNOSIS — I10 Essential (primary) hypertension: Secondary | ICD-10-CM | POA: Diagnosis not present

## 2019-10-18 DIAGNOSIS — R140 Abdominal distension (gaseous): Secondary | ICD-10-CM

## 2019-10-18 NOTE — Progress Notes (Signed)
MRN : [id number]  Bianca Horton is a 84 y.o. (03-02-1936) female who presents with chief complaint of  Chief Complaint  Patient presents with  . Follow-up    ED-AAA  .  History of Present Illness:   The patient presents to the office sooner than expected for evaluation of an abdominal aortic aneurysm. The aneurysm was found incidentally by plain x-rays performed for evaluation of the lumbar spine secondary to the acute worsening of chronic back pain. Patient denies abdominal pain or unusual back pain at the time of this visit, no other abdominal complaints.  No history of an acute onset of painful blue discoloration of the toes.     No family history of AAA.   Patient denies amaurosis fugax or TIA symptoms. There is no history of claudication or rest pain symptoms of the lower extremities.  The patient denies angina or shortness of breath.  Lumbar films shows an AAA that measures 4.80 cm  Current Meds  Medication Sig  . buPROPion (WELLBUTRIN XL) 150 MG 24 hr tablet   . carvedilol (COREG) 12.5 MG tablet 12.5 mg  . carvedilol (COREG) 25 MG tablet   . cilostazol (PLETAL) 100 MG tablet   . cloNIDine (CATAPRES) 0.1 MG tablet   . cloNIDine (CATAPRES) 0.2 MG tablet   . cloNIDine (CATAPRES) 0.3 MG tablet   . levothyroxine (SYNTHROID) 112 MCG tablet Take 112 mcg by mouth daily.  Marland Kitchen levothyroxine (SYNTHROID, LEVOTHROID) 100 MCG tablet   . simvastatin (ZOCOR) 40 MG tablet     Past Medical History:  Diagnosis Date  . High cholesterol   . Hypertension   . Thyroid disease     Past Surgical History:  Procedure Laterality Date  . GALLBLADDER SURGERY      Social History Social History   Tobacco Use  . Smoking status: Light Tobacco Smoker  . Smokeless tobacco: Never Used  . Tobacco comment: process of quitting   Substance Use Topics  . Alcohol use: Never  . Drug use: Never    Family History No family history on file.  No Known Allergies   REVIEW OF SYSTEMS (Negative  unless checked)  Constitutional: [] Weight loss  [] Fever  [] Chills Cardiac: [] Chest pain   [] Chest pressure   [] Palpitations   [] Shortness of breath when laying flat   [] Shortness of breath with exertion. Vascular:  [] Pain in legs with walking   [] Pain in legs at rest  [] History of DVT   [] Phlebitis   [] Swelling in legs   [] Varicose veins   [] Non-healing ulcers Pulmonary:   [] Uses home oxygen   [] Productive cough   [] Hemoptysis   [] Wheeze  [] COPD   [] Asthma Neurologic:  [] Dizziness   [] Seizures   [] History of stroke   [] History of TIA  [] Aphasia   [] Vissual changes   [] Weakness or numbness in arm   [] Weakness or numbness in leg Musculoskeletal:   [] Joint swelling   [x] Joint pain   [x] Low back pain Hematologic:  [] Easy bruising  [] Easy bleeding   [] Hypercoagulable state   [] Anemic Gastrointestinal:  [] Diarrhea   [] Vomiting  [x] Gastroesophageal reflux/heartburn   [] Difficulty swallowing. Genitourinary:  [] Chronic kidney disease   [] Difficult urination  [] Frequent urination   [] Blood in urine Skin:  [] Rashes   [] Ulcers  Psychological:  [] History of anxiety   []  History of major depression.  Physical Examination  Vitals:   10/18/19 1412  BP: (!) 172/76  Pulse: 79  Weight: 129 lb (58.5 kg)  Height: 5\' 3"  (1.6 m)  Body mass index is 22.85 kg/m. Gen: WD/WN, NAD Head: [place]/AT, No temporalis wasting.  Ear/Nose/Throat: Hearing grossly intact, nares w/o erythema or drainage Eyes: PER, EOMI, sclera nonicteric.  Neck: Supple, no large masses.   Pulmonary:  Good air movement, no audible wheezing bilaterally, no use of accessory muscles.  Cardiac: RRR, no JVD Vascular:  Vessel Right Left  Radial Palpable Palpable  Gastrointestinal: Non-distended. No guarding/no peritoneal signs.  Musculoskeletal: M/S 5/5 throughout.  No deformity or atrophy.  Neurologic: CN 2-12 intact. Symmetrical.  Speech is fluent. Motor exam as listed above. Psychiatric: Judgment intact, Mood & affect appropriate for pt's  clinical situation. Dermatologic: No rashes or ulcers noted.  No changes consistent with cellulitis.   CBC Lab Results  Component Value Date   WBC 6.8 09/11/2019   HGB 9.8 (L) 09/11/2019   HCT 27.9 (L) 09/11/2019   MCV 91.8 09/11/2019   PLT 215 09/11/2019    BMET    Component Value Date/Time   NA 133 (L) 09/11/2019 2034   K 3.0 (L) 09/11/2019 2034   K 3.7 08/13/2011 1429   CL 97 (L) 09/11/2019 2034   CO2 25 09/11/2019 2034   GLUCOSE 102 (H) 09/11/2019 2034   BUN 23 09/11/2019 2034   CREATININE 2.01 (H) 09/11/2019 2034   CALCIUM 10.1 09/11/2019 2034   GFRNONAA 23 (L) 09/11/2019 2034   GFRAA 26 (L) 09/11/2019 2034   CrCl cannot be calculated (Patient's most recent lab result is older than the maximum 21 days allowed.).  COAG Lab Results  Component Value Date   INR 1.0 09/11/2019    Radiology No results found.   Assessment/Plan 1. AAA (abdominal aortic aneurysm) without rupture [facility]) The patient is overdue for a formal evaluation of the abdominal aortic aneurysm.  By plain films which usually magnify the size of the aneurysm she is 4.8 and therefore does not need emergent surgery.  However full characterization of the aneurysm is warranted.  I have also discussed optimizing medical management with hypertension and lipid control and the importance of abstinence from tobacco.  The patient is also encouraged to exercise a minimum of 30 minutes 4 times a week.   Should the patient develop new onset abdominal or back pain or signs of peripheral embolization they are instructed to seek medical attention immediately and to alert the physician providing care that they have an aneurysm.   The patient voices their understanding. I have scheduled the patient to return in 2 to 4 weeks with an aortic duplex. - VAS US AORTA/IVC/ILIACS; Future  2. Essential hypertension Continue antihypertensive medications as already ordered, these medications have been reviewed and there are no  changes at this time.   3. Mixed hyperlipidemia Continue statin as ordered and reviewed, no changes at this time   4. Primary osteoarthritis involving multiple joints Continue NSAID medications as already ordered, these medications have been reviewed and there are no changes at this time.  Continued activity and therapy was stressed.    Levora Dredge, MD  10/18/2019 2:26 PM

## 2019-10-19 DIAGNOSIS — I10 Essential (primary) hypertension: Secondary | ICD-10-CM | POA: Insufficient documentation

## 2019-10-19 DIAGNOSIS — E785 Hyperlipidemia, unspecified: Secondary | ICD-10-CM | POA: Insufficient documentation

## 2019-10-19 DIAGNOSIS — M1990 Unspecified osteoarthritis, unspecified site: Secondary | ICD-10-CM | POA: Insufficient documentation

## 2019-10-20 ENCOUNTER — Encounter (INDEPENDENT_AMBULATORY_CARE_PROVIDER_SITE_OTHER): Payer: Self-pay | Admitting: Vascular Surgery

## 2019-11-18 ENCOUNTER — Other Ambulatory Visit (INDEPENDENT_AMBULATORY_CARE_PROVIDER_SITE_OTHER): Payer: Self-pay | Admitting: Vascular Surgery

## 2019-11-18 DIAGNOSIS — I714 Abdominal aortic aneurysm, without rupture, unspecified: Secondary | ICD-10-CM

## 2019-11-22 ENCOUNTER — Encounter (INDEPENDENT_AMBULATORY_CARE_PROVIDER_SITE_OTHER): Payer: Self-pay | Admitting: Vascular Surgery

## 2019-11-22 ENCOUNTER — Ambulatory Visit (INDEPENDENT_AMBULATORY_CARE_PROVIDER_SITE_OTHER): Payer: Medicare Other | Admitting: Vascular Surgery

## 2019-11-22 ENCOUNTER — Other Ambulatory Visit: Payer: Self-pay

## 2019-11-22 ENCOUNTER — Ambulatory Visit (INDEPENDENT_AMBULATORY_CARE_PROVIDER_SITE_OTHER): Payer: Medicare Other

## 2019-11-22 VITALS — BP 92/57 | HR 83 | Resp 16 | Wt 130.8 lb

## 2019-11-22 DIAGNOSIS — I10 Essential (primary) hypertension: Secondary | ICD-10-CM

## 2019-11-22 DIAGNOSIS — I714 Abdominal aortic aneurysm, without rupture, unspecified: Secondary | ICD-10-CM

## 2019-11-22 DIAGNOSIS — M8949 Other hypertrophic osteoarthropathy, multiple sites: Secondary | ICD-10-CM | POA: Diagnosis not present

## 2019-11-22 DIAGNOSIS — M150 Primary generalized (osteo)arthritis: Secondary | ICD-10-CM

## 2019-11-22 DIAGNOSIS — E782 Mixed hyperlipidemia: Secondary | ICD-10-CM

## 2019-11-22 DIAGNOSIS — M159 Polyosteoarthritis, unspecified: Secondary | ICD-10-CM

## 2019-11-24 ENCOUNTER — Encounter (INDEPENDENT_AMBULATORY_CARE_PROVIDER_SITE_OTHER): Payer: Self-pay | Admitting: Vascular Surgery

## 2019-11-24 NOTE — Progress Notes (Signed)
MRN : [id number]  Bianca Horton is a 84 y.o. (05-02-1935) female who presents with chief complaint of  Chief Complaint  Patient presents with  . Follow-up    ultrasound follow up  .  History of Present Illness:  The patient presents to the office sooner than expected for evaluation of an abdominal aortic aneurysm. The aneurysm was found incidentally by plain x-rays performed for evaluation of the lumbar spine secondary to the acute worsening of chronic back pain. Patient denies abdominal pain or unusual back pain at the time of this visit, no other abdominal complaints.  No history of an acute onset of painful blue discoloration of the toes.     No family history of AAA.   Patient denies amaurosis fugax or TIA symptoms. There is no history of claudication or rest pain symptoms of the lower extremities.  The patient denies angina or shortness of breath.  Previous lumbar films showed an AAA that measures 4.80 cm  Duplex ultrasound of the abdominal aorta shows an infrarenal AAA that measures 4.2 cm  Current Meds  Medication Sig  . buPROPion (WELLBUTRIN XL) 150 MG 24 hr tablet   . carvedilol (COREG) 12.5 MG tablet 12.5 mg  . carvedilol (COREG) 25 MG tablet   . cilostazol (PLETAL) 100 MG tablet   . cilostazol (PLETAL) 100 MG tablet Take by mouth.  . cloNIDine (CATAPRES) 0.1 MG tablet   . cloNIDine (CATAPRES) 0.2 MG tablet   . cloNIDine (CATAPRES) 0.3 MG tablet   . levothyroxine (SYNTHROID) 137 MCG tablet Take 137 mcg by mouth every morning.  Marland Kitchen levothyroxine (SYNTHROID, LEVOTHROID) 100 MCG tablet   . meloxicam (MOBIC) 7.5 MG tablet Take 7.5 mg by mouth 2 (two) times daily as needed.  . simvastatin (ZOCOR) 40 MG tablet     Past Medical History:  Diagnosis Date  . High cholesterol   . Hypertension   . Thyroid disease     Past Surgical History:  Procedure Laterality Date  . GALLBLADDER SURGERY      Social History Social History   Tobacco Use  . Smoking status:  Light Tobacco Smoker  . Smokeless tobacco: Never Used  . Tobacco comment: process of quitting   Substance Use Topics  . Alcohol use: Never  . Drug use: Never    Family History No family history on file.  No Known Allergies   REVIEW OF SYSTEMS (Negative unless checked)  Constitutional: [] Weight loss  [] Fever  [] Chills Cardiac: [] Chest pain   [] Chest pressure   [] Palpitations   [] Shortness of breath when laying flat   [] Shortness of breath with exertion. Vascular:  [] Pain in legs with walking   [] Pain in legs at rest  [] History of DVT   [] Phlebitis   [] Swelling in legs   [] Varicose veins   [] Non-healing ulcers Pulmonary:   [] Uses home oxygen   [] Productive cough   [] Hemoptysis   [] Wheeze  [] COPD   [] Asthma Neurologic:  [] Dizziness   [] Seizures   [] History of stroke   [] History of TIA  [] Aphasia   [] Vissual changes   [] Weakness or numbness in arm   [] Weakness or numbness in leg Musculoskeletal:   [] Joint swelling   [] Joint pain   [] Low back pain Hematologic:  [] Easy bruising  [] Easy bleeding   [] Hypercoagulable state   [] Anemic Gastrointestinal:  [] Diarrhea   [] Vomiting  [] Gastroesophageal reflux/heartburn   [] Difficulty swallowing. Genitourinary:  [] Chronic kidney disease   [] Difficult urination  [] Frequent urination   [] Blood in urine Skin:  []   Rashes   [] Ulcers  Psychological:  [] History of anxiety   []  History of major depression.  Physical Examination  Vitals:   11/22/19 1423  BP: (!) 92/57  Pulse: 83  Resp: 16  Weight: 130 lb 12.8 oz (59.3 kg)   Body mass index is 23.17 kg/m. Gen: WD/WN, NAD Head: [place]/AT, No temporalis wasting.  Ear/Nose/Throat: Hearing grossly intact, nares w/o erythema or drainage Eyes: PER, EOMI, sclera nonicteric.  Neck: Supple, no large masses.   Pulmonary:  Good air movement, no audible wheezing bilaterally, no use of accessory muscles.  Cardiac: RRR, no JVD Vascular:  Vessel Right Left  Radial Palpable Palpable  Gastrointestinal:  Non-distended. No guarding/no peritoneal signs.  Musculoskeletal: M/S 5/5 throughout.  No deformity or atrophy.  Neurologic: CN 2-12 intact. Symmetrical.  Speech is fluent. Motor exam as listed above. Psychiatric: Judgment intact, Mood & affect appropriate for pt's clinical situation. Dermatologic: No rashes or ulcers noted.  No changes consistent with cellulitis.   CBC Lab Results  Component Value Date   WBC 6.8 09/11/2019   HGB 9.8 (L) 09/11/2019   HCT 27.9 (L) 09/11/2019   MCV 91.8 09/11/2019   PLT 215 09/11/2019    BMET    Component Value Date/Time   NA 133 (L) 09/11/2019 2034   K 3.0 (L) 09/11/2019 2034   K 3.7 08/13/2011 1429   CL 97 (L) 09/11/2019 2034   CO2 25 09/11/2019 2034   GLUCOSE 102 (H) 09/11/2019 2034   BUN 23 09/11/2019 2034   CREATININE 2.01 (H) 09/11/2019 2034   CALCIUM 10.1 09/11/2019 2034   GFRNONAA 23 (L) 09/11/2019 2034   GFRAA 26 (L) 09/11/2019 2034   CrCl cannot be calculated (Patient's most recent lab result is older than the maximum 21 days allowed.).  COAG Lab Results  Component Value Date   INR 1.0 09/11/2019    Radiology VAS 11/12/2019 AAA DUPLEX  Result Date: 11/22/2019 ABDOMINAL AORTA STUDY Indications: Follow up exam for known AAA. Limitations: Air/bowel gas.  Comparison Study: CT w/o contrast on 12/16/09: 3cm AAA;                   Lumbar X-ray on 09/11/19: Fusiform infrarenal AAA measuring up                   to 4.8cm; Performing Technologist: 11/24/2019 RT, RDMS, RVT  Examination Guidelines: A complete evaluation includes B-mode imaging, spectral Doppler, color Doppler, and power Doppler as needed of all accessible portions of each vessel. Bilateral testing is considered an integral part of a complete examination. Limited examinations for reoccurring indications may be performed as noted.  Abdominal Aorta Findings: +-----------+-------+----------+----------+----------+--------+--------+ Location   AP (cm)Trans (cm)PSV (cm/s)Waveform   ThrombusComments +-----------+-------+----------+----------+----------+--------+--------+ Proximal   2.35   2.26      88        monophasic                 +-----------+-------+----------+----------+----------+--------+--------+ Mid        1.86   1.75      69        monophasic                 +-----------+-------+----------+----------+----------+--------+--------+ Distal     4.19   4.19      24        monophasicPresent          +-----------+-------+----------+----------+----------+--------+--------+ RT CIA Prox1.4    1.3       67  monophasic                 +-----------+-------+----------+----------+----------+--------+--------+ LT CIA Prox0.9    0.9       121       monophasic                 +-----------+-------+----------+----------+----------+--------+--------+ Visualization of the Right CIA Mid artery and Left CIA Mid artery was limited.  Summary: There is evidence of abnormal dilatation of the distal Abdominal aorta. No hemodynamically significant stenosis noted in the abdominal aorta or bilateral proximal/mid common iliac arteries. Significant increase in the maximum aorta diameter measurement when compared to the 2011 CT however the lumbar x-ray from earlier this year appears to have overestimated the maximum diameter.  *See table(s) above for measurements and observations.  Electronically signed by Levora Dredge MD on 11/22/2019 at 4:52:47 PM.   Final      Assessment/Plan 1. AAA (abdominal aortic aneurysm) without rupture (HCC) No surgery or intervention at this time. The patient has an asymptomatic abdominal aortic aneurysm that is less than 4 cm in maximal diameter.  I have discussed the natural history of abdominal aortic aneurysm and the small risk of rupture for aneurysm less than 5 cm in size.  However, as these small aneurysms tend to enlarge over time, continued surveillance with ultrasound or CT scan is mandatory.  I have also discussed  optimizing medical management with hypertension and lipid control and the importance of abstinence from tobacco.  The patient is also encouraged to exercise a minimum of 30 minutes 4 times a week.  Should the patient develop new onset abdominal or back pain or signs of peripheral embolization they are instructed to seek medical attention immediately and to alert the physician providing care that they have an aneurysm.  The patient voices their understanding. The patient will return in 12 months with an aortic duplex.  - VAS US AORTA/IVC/ILIACS; Future  2. Essential hypertension Continue antihypertensive medications as already ordered, these medications have been reviewed and there are no changes at this time.   3. Primary osteoarthritis involving multiple joints Continue NSAID medications as already ordered, these medications have been reviewed and there are no changes at this time.  Continued activity and therapy was stressed.   4. Mixed hyperlipidemia Continue statin as ordered and reviewed, no changes at this time    Levora Dredge, MD  11/24/2019 11:43 AM

## 2020-06-05 DIAGNOSIS — E038 Other specified hypothyroidism: Secondary | ICD-10-CM | POA: Diagnosis not present

## 2020-06-05 DIAGNOSIS — F32A Depression, unspecified: Secondary | ICD-10-CM | POA: Diagnosis not present

## 2020-06-05 DIAGNOSIS — R0602 Shortness of breath: Secondary | ICD-10-CM | POA: Diagnosis not present

## 2020-06-05 DIAGNOSIS — I509 Heart failure, unspecified: Secondary | ICD-10-CM | POA: Diagnosis not present

## 2020-06-05 DIAGNOSIS — N189 Chronic kidney disease, unspecified: Secondary | ICD-10-CM | POA: Diagnosis not present

## 2020-06-05 DIAGNOSIS — E782 Mixed hyperlipidemia: Secondary | ICD-10-CM | POA: Diagnosis not present

## 2020-06-05 DIAGNOSIS — I10 Essential (primary) hypertension: Secondary | ICD-10-CM | POA: Diagnosis not present

## 2020-06-05 DIAGNOSIS — J45998 Other asthma: Secondary | ICD-10-CM | POA: Diagnosis not present

## 2020-06-05 DIAGNOSIS — I2510 Atherosclerotic heart disease of native coronary artery without angina pectoris: Secondary | ICD-10-CM | POA: Diagnosis not present

## 2020-06-05 DIAGNOSIS — I739 Peripheral vascular disease, unspecified: Secondary | ICD-10-CM | POA: Diagnosis not present

## 2020-07-05 DIAGNOSIS — H35342 Macular cyst, hole, or pseudohole, left eye: Secondary | ICD-10-CM | POA: Diagnosis not present

## 2020-08-06 IMAGING — CR DG LUMBAR SPINE COMPLETE 4+V
5 series · 5 of 5 positions shown · non-contrast
Comparison: CT abdomen pelvis 12/16/2009

CLINICAL DATA: Right lower extremity pain for 2 weeks, no cause of
injury.

EXAM:
LUMBAR SPINE - COMPLETE 4+ VIEW

[l-spine ap]
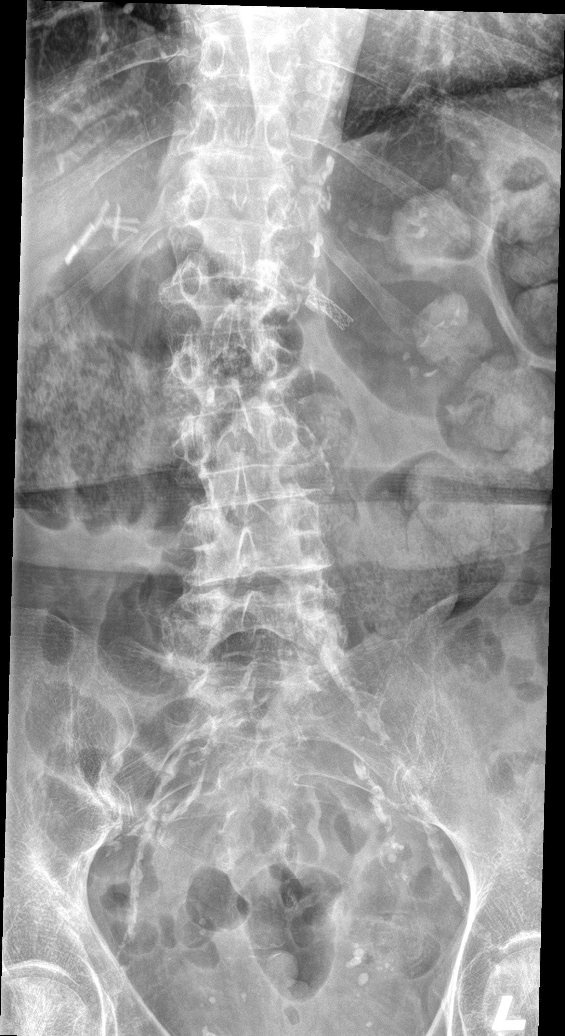

[l-spine obl (1 of 2)]
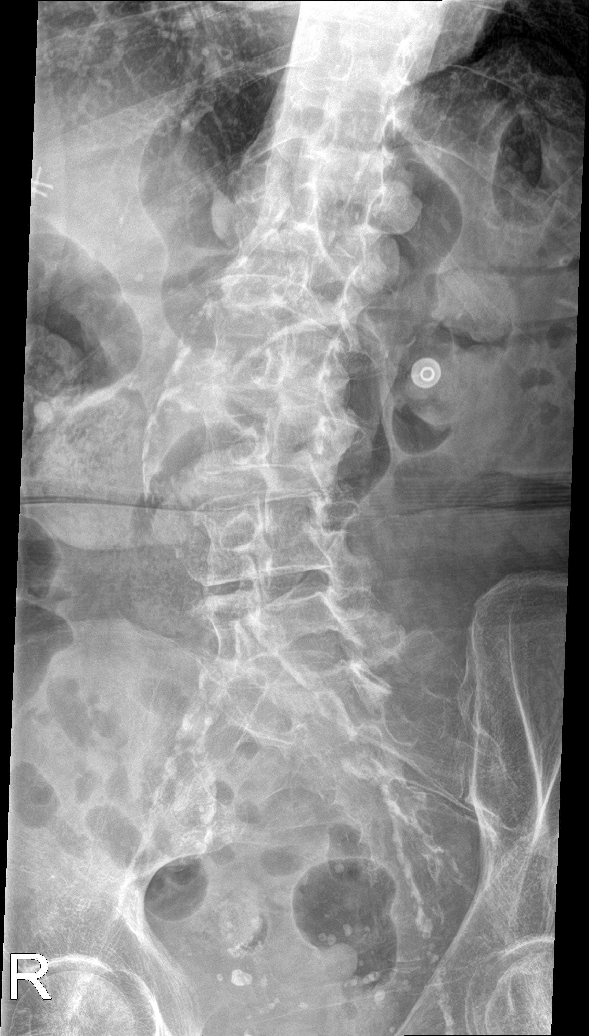

[l-spine obl (2 of 2)]
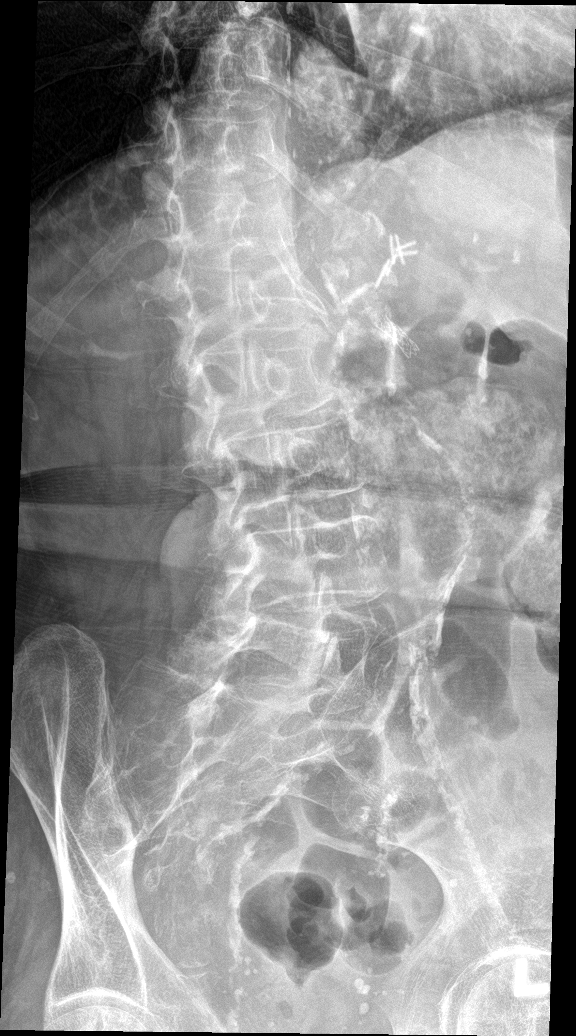

[l-spine lat]
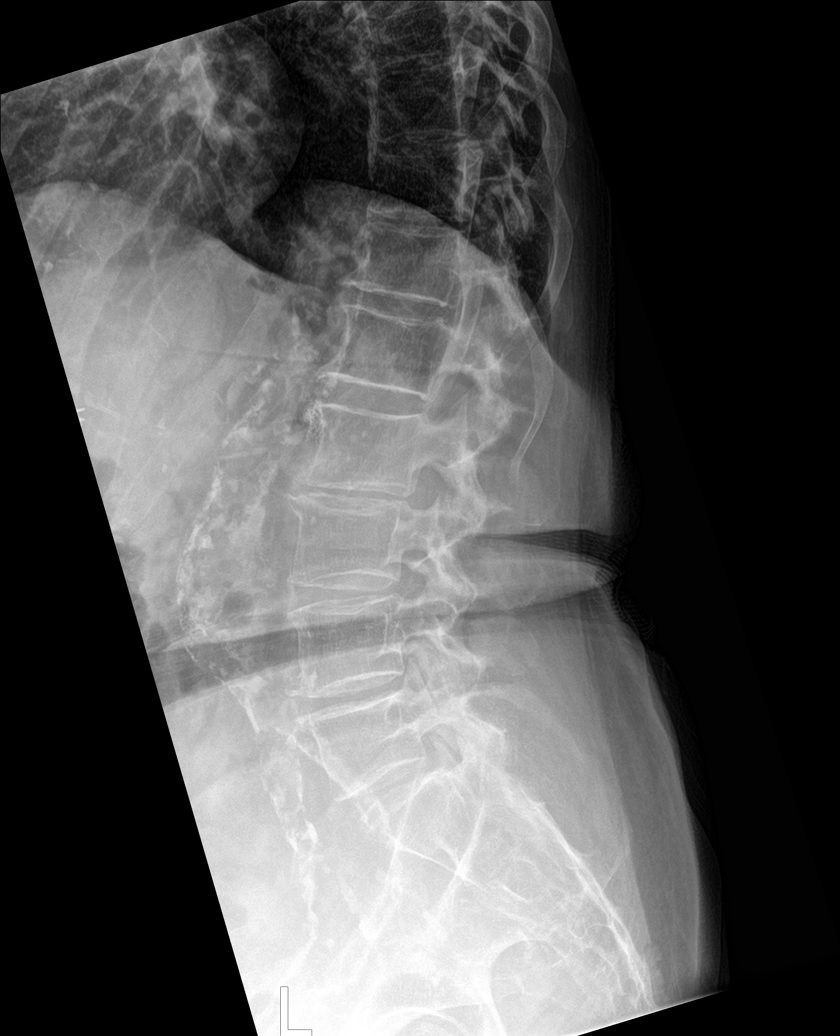

[l-spine spot]
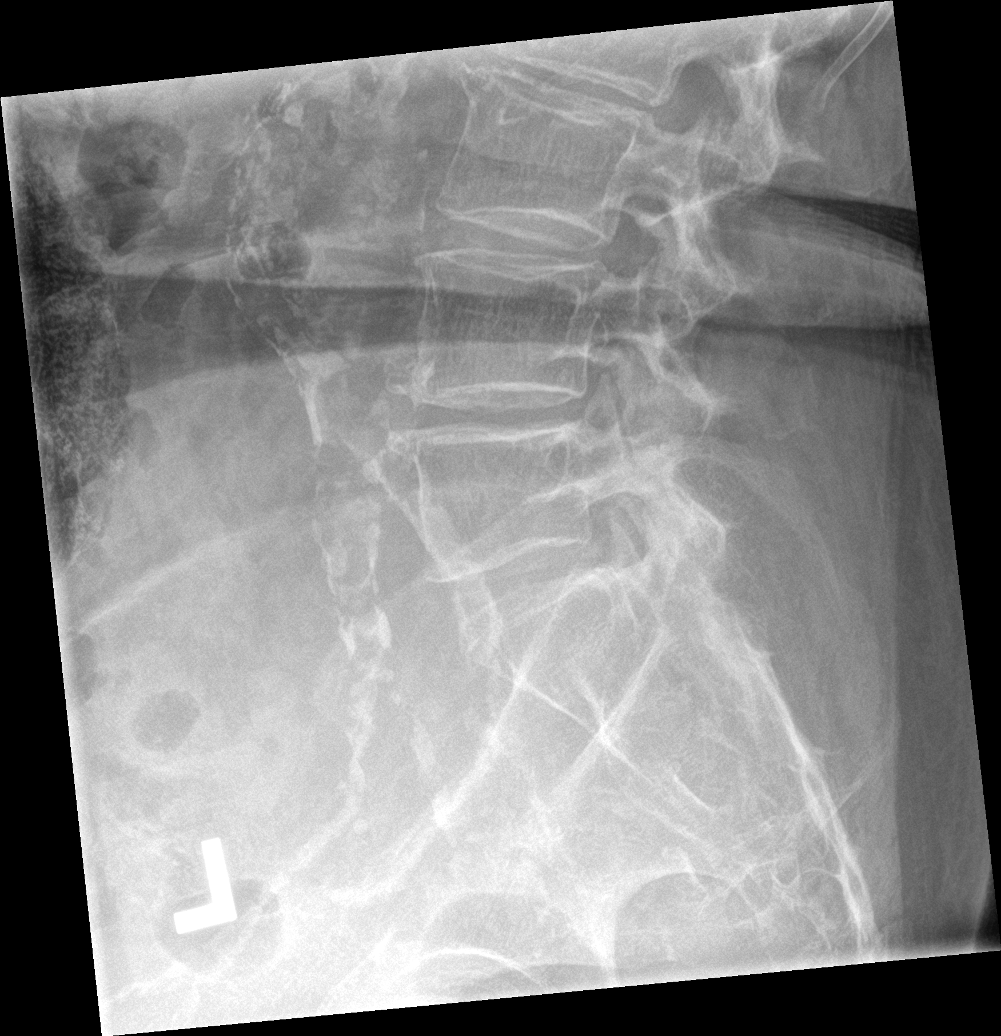

[5 of 5 positions shown; findings below may reference images not displayed]

FINDINGS: Five lumbar levels. The osseous structures appear diffusely
demineralized which may limit detection of small or nondisplaced
fractures. Mild levocurvature of the lower lumbar levels apex L4.
Dextrocurvature of the thoracolumbar junction apex L1. No definite
acute fracture or vertebral body height loss is seen. No
spondylolisthesis or spondylolysis is evident. No acute or worrisome
osseous lesions in the included lumbar spine or visible bony pelvis.
Moderate multilevel discogenic and facet degenerative changes
throughout the spine, progressive from comparison. Additional
arthrosis of the SI joints and hips. No worrisome osseous lesions.
Atherosclerotic plaque within the aorta with a fusiform infrarenal
abdominal aortic aneurysm measuring up to 4.8 cm on this exam,
likely increased from comparison in 0766. Additional extensive
atherosclerotic plaque throughout the abdomen and pelvis. Small
vascular stent in the left upper quadrant, likely renal artery
appears possibly fractured. Cholecystectomy clips in the right upper
quadrant.
IMPRESSION: 1. No definite acute fracture or vertebral body height loss.
2. Multilevel discogenic and facet degenerative changes.
3. Fusiform infrarenal abdominal aortic aneurysm measuring up to
cm on this exam, likely increased from comparison in 0766. Consider
further evaluation with ultrasound or CT angiography.
4. Additional extensive atherosclerotic plaque throughout the
abdomen and pelvis. Small vascular stent in the left upper quadrant,
likely fractured renal artery.

## 2020-10-02 DIAGNOSIS — E782 Mixed hyperlipidemia: Secondary | ICD-10-CM | POA: Diagnosis not present

## 2020-10-02 DIAGNOSIS — I10 Essential (primary) hypertension: Secondary | ICD-10-CM | POA: Diagnosis not present

## 2020-10-02 DIAGNOSIS — R7301 Impaired fasting glucose: Secondary | ICD-10-CM | POA: Diagnosis not present

## 2020-10-02 DIAGNOSIS — E038 Other specified hypothyroidism: Secondary | ICD-10-CM | POA: Diagnosis not present

## 2020-10-09 DIAGNOSIS — F32A Depression, unspecified: Secondary | ICD-10-CM | POA: Diagnosis not present

## 2020-10-09 DIAGNOSIS — I2510 Atherosclerotic heart disease of native coronary artery without angina pectoris: Secondary | ICD-10-CM | POA: Diagnosis not present

## 2020-10-09 DIAGNOSIS — J449 Chronic obstructive pulmonary disease, unspecified: Secondary | ICD-10-CM | POA: Diagnosis not present

## 2020-10-09 DIAGNOSIS — I340 Nonrheumatic mitral (valve) insufficiency: Secondary | ICD-10-CM | POA: Diagnosis not present

## 2020-10-09 DIAGNOSIS — I10 Essential (primary) hypertension: Secondary | ICD-10-CM | POA: Diagnosis not present

## 2020-10-09 DIAGNOSIS — I509 Heart failure, unspecified: Secondary | ICD-10-CM | POA: Diagnosis not present

## 2020-10-09 DIAGNOSIS — E038 Other specified hypothyroidism: Secondary | ICD-10-CM | POA: Diagnosis not present

## 2020-10-09 DIAGNOSIS — J45998 Other asthma: Secondary | ICD-10-CM | POA: Diagnosis not present

## 2020-10-09 DIAGNOSIS — I739 Peripheral vascular disease, unspecified: Secondary | ICD-10-CM | POA: Diagnosis not present

## 2020-10-09 DIAGNOSIS — R609 Edema, unspecified: Secondary | ICD-10-CM | POA: Diagnosis not present

## 2020-10-09 DIAGNOSIS — E782 Mixed hyperlipidemia: Secondary | ICD-10-CM | POA: Diagnosis not present

## 2021-02-12 DIAGNOSIS — E782 Mixed hyperlipidemia: Secondary | ICD-10-CM | POA: Diagnosis not present

## 2021-02-12 DIAGNOSIS — E038 Other specified hypothyroidism: Secondary | ICD-10-CM | POA: Diagnosis not present

## 2021-02-12 DIAGNOSIS — F32A Depression, unspecified: Secondary | ICD-10-CM | POA: Diagnosis not present

## 2021-02-12 DIAGNOSIS — N189 Chronic kidney disease, unspecified: Secondary | ICD-10-CM | POA: Diagnosis not present

## 2021-02-12 DIAGNOSIS — E039 Hypothyroidism, unspecified: Secondary | ICD-10-CM | POA: Diagnosis not present

## 2021-02-12 DIAGNOSIS — E785 Hyperlipidemia, unspecified: Secondary | ICD-10-CM | POA: Diagnosis not present

## 2021-02-12 DIAGNOSIS — I10 Essential (primary) hypertension: Secondary | ICD-10-CM | POA: Diagnosis not present

## 2021-02-19 DIAGNOSIS — I2510 Atherosclerotic heart disease of native coronary artery without angina pectoris: Secondary | ICD-10-CM | POA: Diagnosis not present

## 2021-02-19 DIAGNOSIS — I509 Heart failure, unspecified: Secondary | ICD-10-CM | POA: Diagnosis not present

## 2021-02-19 DIAGNOSIS — E782 Mixed hyperlipidemia: Secondary | ICD-10-CM | POA: Diagnosis not present

## 2021-02-19 DIAGNOSIS — J45998 Other asthma: Secondary | ICD-10-CM | POA: Diagnosis not present

## 2021-02-19 DIAGNOSIS — Z23 Encounter for immunization: Secondary | ICD-10-CM | POA: Diagnosis not present

## 2021-02-19 DIAGNOSIS — I739 Peripheral vascular disease, unspecified: Secondary | ICD-10-CM | POA: Diagnosis not present

## 2021-02-19 DIAGNOSIS — I10 Essential (primary) hypertension: Secondary | ICD-10-CM | POA: Diagnosis not present

## 2021-02-19 DIAGNOSIS — J449 Chronic obstructive pulmonary disease, unspecified: Secondary | ICD-10-CM | POA: Diagnosis not present

## 2021-05-28 ENCOUNTER — Ambulatory Visit (INDEPENDENT_AMBULATORY_CARE_PROVIDER_SITE_OTHER): Payer: Medicare Other | Admitting: Nurse Practitioner

## 2021-05-28 ENCOUNTER — Other Ambulatory Visit (INDEPENDENT_AMBULATORY_CARE_PROVIDER_SITE_OTHER): Payer: Medicare Other

## 2021-06-04 ENCOUNTER — Ambulatory Visit (INDEPENDENT_AMBULATORY_CARE_PROVIDER_SITE_OTHER): Payer: Medicare Other | Admitting: Nurse Practitioner

## 2021-06-04 ENCOUNTER — Encounter (INDEPENDENT_AMBULATORY_CARE_PROVIDER_SITE_OTHER): Payer: Self-pay | Admitting: Nurse Practitioner

## 2021-06-04 ENCOUNTER — Other Ambulatory Visit (INDEPENDENT_AMBULATORY_CARE_PROVIDER_SITE_OTHER): Payer: Self-pay | Admitting: Vascular Surgery

## 2021-06-04 ENCOUNTER — Ambulatory Visit (INDEPENDENT_AMBULATORY_CARE_PROVIDER_SITE_OTHER): Payer: Medicare Other

## 2021-06-04 VITALS — BP 125/71 | HR 75 | Resp 16 | Wt 135.6 lb

## 2021-06-04 DIAGNOSIS — I7143 Infrarenal abdominal aortic aneurysm, without rupture: Secondary | ICD-10-CM

## 2021-06-04 DIAGNOSIS — E782 Mixed hyperlipidemia: Secondary | ICD-10-CM | POA: Diagnosis not present

## 2021-06-04 DIAGNOSIS — I10 Essential (primary) hypertension: Secondary | ICD-10-CM | POA: Diagnosis not present

## 2021-06-09 ENCOUNTER — Encounter (INDEPENDENT_AMBULATORY_CARE_PROVIDER_SITE_OTHER): Payer: Self-pay | Admitting: Nurse Practitioner

## 2021-06-09 NOTE — Progress Notes (Signed)
? ?Subjective:  ? ? Patient ID: Bianca Horton, female    DOB: 04/27/1935, 86 y.o.   MRN: [id number] ?Chief Complaint  ?Patient presents with  ? Follow-up  ?  Ultrasound follow up  ? ? ?The patient returns to the office for surveillance of a known abdominal aortic aneurysm. Patient denies abdominal pain or back pain, no other abdominal complaints. No changes suggesting embolic episodes.  ? ?There have been no interval changes in the patient's overall health care since his last visit. ? ?Patient denies amaurosis fugax or TIA symptoms. There is no history of claudication or rest pain symptoms of the lower extremities. The patient denies angina or shortness of breath.  ? ?Duplex US of the aorta and iliac arteries shows an AAA measured 4.0 cm with no iliac artery aneurysms.  Previous IV aortic diameter was 4.1 cm on 11/2019 ? ? ?Review of Systems  ?Gastrointestinal:  Negative for abdominal pain.  ?All other systems reviewed and are negative. ? ?   ?Objective:  ? Physical Exam ?Vitals reviewed.  ?HENT:  ?   Head: Normocephalic.  ?Cardiovascular:  ?   Rate and Rhythm: Normal rate.  ?   Pulses:     ?     Radial pulses are 1+ on the right side and 1+ on the left side.  ?Pulmonary:  ?   Effort: Pulmonary effort is normal.  ?Skin: ?   General: Skin is warm and dry.  ?Neurological:  ?   Mental Status: She is alert and oriented to person, place, and time.  ?Psychiatric:     ?   Mood and Affect: Mood normal.     ?   Behavior: Behavior normal.     ?   Thought Content: Thought content normal.     ?   Judgment: Judgment normal.  ? ? ?BP 125/71 (BP Location: Left Arm)   Pulse 75   Resp 16   Wt 135 lb 9.6 oz (61.5 kg)   BMI 24.02 kg/m?  ? ?Past Medical History:  ?Diagnosis Date  ? High cholesterol   ? Hypertension   ? Thyroid disease   ? ? ?Social History  ? ?Socioeconomic History  ? Marital status: Single  ?  Spouse name: Not on file  ? Number of children: Not on file  ? Years of education: Not on file  ? Highest education level:  Not on file  ?Occupational History  ? Not on file  ?Tobacco Use  ? Smoking status: Light Smoker  ? Smokeless tobacco: Never  ? Tobacco comments:  ?  process of quitting   ?Substance and Sexual Activity  ? Alcohol use: Never  ? Drug use: Never  ? Sexual activity: Not on file  ?Other Topics Concern  ? Not on file  ?Social History Narrative  ? Not on file  ? ?Social Determinants of Health  ? ?Financial Resource Strain: Not on file  ?Food Insecurity: Not on file  ?Transportation Needs: Not on file  ?Physical Activity: Not on file  ?Stress: Not on file  ?Social Connections: Not on file  ?Intimate Partner Violence: Not on file  ? ? ?Past Surgical History:  ?Procedure Laterality Date  ? GALLBLADDER SURGERY    ? ? ?History reviewed. No pertinent family history. ? ?Allergies  ?Allergen Reactions  ? Clopidogrel   ?  Other reaction(s): brusing skin  ? Sertraline Hcl Nausea And Vomiting  ?  Other reaction(s): Nausea, Vomiting, Diarrhea, sleepwalking, Nausea, Vomiting, Diarrhea, sleepwalking  ? ? ? ?  Latest Ref Rng & Units 09/11/2019  ?  8:34 PM  ?CBC  ?WBC 4.0 - 10.5 K/uL 6.8    ?Hemoglobin 12.0 - 15.0 g/dL 9.8    ?Hematocrit 36.0 - 46.0 % 27.9    ?Platelets 150 - 400 K/uL 215    ? ? ? ? ?CMP  ?   ?Component Value Date/Time  ? NA 133 (L) 09/11/2019 2034  ? K 3.0 (L) 09/11/2019 2034  ? K 3.7 08/13/2011 1429  ? CL 97 (L) 09/11/2019 2034  ? CO2 25 09/11/2019 2034  ? GLUCOSE 102 (H) 09/11/2019 2034  ? BUN 23 09/11/2019 2034  ? CREATININE 2.01 (H) 09/11/2019 2034  ? CALCIUM 10.1 09/11/2019 2034  ? GFRNONAA 23 (L) 09/11/2019 2034  ? GFRAA 26 (L) 09/11/2019 2034  ? ? ? ?No results found. ? ?   ?Assessment & Plan:  ? ?1. Infrarenal abdominal aortic aneurysm (AAA) without rupture (HCC) ?No surgery or intervention at this time. ?The patient has an asymptomatic abdominal aortic aneurysm that is less than 4 cm in maximal diameter.  I have discussed the natural history of abdominal aortic aneurysm and the small risk of rupture for aneurysm  less than 5 cm in size.  However, as these small aneurysms tend to enlarge over time, continued surveillance with ultrasound or CT scan is mandatory.  ?I have also discussed optimizing medical management with hypertension and lipid control and the importance of abstinence from tobacco.  The patient is also encouraged to exercise a minimum of 30 minutes 4 times a week.  ?Should the patient develop new onset abdominal or back pain or signs of peripheral embolization they are instructed to seek medical attention immediately and to alert the physician providing care that they have an aneurysm.  ?The patient voices their understanding. ?The patient will return in 12 months with an aortic duplex.  ? ?2. Essential hypertension ?Continue antihypertensive medications as already ordered, these medications have been reviewed and there are no changes at this time.  ? ?3. Mixed hyperlipidemia ?Continue statin as ordered and reviewed, no changes at this time  ? ? ?Current Outpatient Medications on File Prior to Visit  ?Medication Sig Dispense Refill  ? buPROPion (WELLBUTRIN XL) 150 MG 24 hr tablet     ? carvedilol (COREG) 12.5 MG tablet 12.5 mg    ? carvedilol (COREG) 25 MG tablet     ? cilostazol (PLETAL) 100 MG tablet     ? cilostazol (PLETAL) 100 MG tablet Take by mouth.    ? cloNIDine (CATAPRES) 0.1 MG tablet     ? cloNIDine (CATAPRES) 0.3 MG tablet     ? levothyroxine (SYNTHROID) 125 MCG tablet Take 125 mcg by mouth every morning.    ? meloxicam (MOBIC) 7.5 MG tablet Take 7.5 mg by mouth 2 (two) times daily as needed.    ? simvastatin (ZOCOR) 40 MG tablet     ? aspirin 325 MG EC tablet Take 325 mg by mouth daily. (Patient not taking: Reported on 10/18/2019)    ? cloNIDine (CATAPRES) 0.2 MG tablet  (Patient not taking: Reported on 06/04/2021)    ? furosemide (LASIX) 20 MG tablet  (Patient not taking: Reported on 08/23/2019)    ? levothyroxine (SYNTHROID) 112 MCG tablet Take 112 mcg by mouth daily. (Patient not taking: Reported on  11/22/2019)    ? levothyroxine (SYNTHROID) 137 MCG tablet Take 137 mcg by mouth every morning. (Patient not taking: Reported on 06/04/2021)    ? levothyroxine (SYNTHROID, LEVOTHROID)  100 MCG tablet  (Patient not taking: Reported on 06/04/2021)    ? losartan-hydrochlorothiazide (HYZAAR) 100-25 MG per tablet  (Patient not taking: Reported on 10/18/2019)    ? Multiple Vitamins-Minerals (PRESERVISION/LUTEIN) CAPS Take by mouth. (Patient not taking: Reported on 10/18/2019)    ? neomycin-polymyxin-hydrocortisone (CORTISPORIN) otic solution 1-2  Drops to the toe after soaking twice daily (Patient not taking: Reported on 12/01/2015) 10 mL 1  ? potassium chloride (KLOR-CON) 10 MEQ tablet Take 1 tablet (10 mEq total) by mouth daily for 5 days. 5 tablet 0  ? ?No current facility-administered medications on file prior to visit.  ? ? ?There are no Patient Instructions on file for this visit. ?No follow-ups on file. ? ? ?Georgiana SpinnerFallon E Nyal Schachter, NP ? ? ?

## 2021-06-25 DIAGNOSIS — E782 Mixed hyperlipidemia: Secondary | ICD-10-CM | POA: Diagnosis not present

## 2021-06-25 DIAGNOSIS — I340 Nonrheumatic mitral (valve) insufficiency: Secondary | ICD-10-CM | POA: Diagnosis not present

## 2021-06-25 DIAGNOSIS — F17200 Nicotine dependence, unspecified, uncomplicated: Secondary | ICD-10-CM | POA: Diagnosis not present

## 2021-06-25 DIAGNOSIS — I739 Peripheral vascular disease, unspecified: Secondary | ICD-10-CM | POA: Diagnosis not present

## 2021-06-25 DIAGNOSIS — I509 Heart failure, unspecified: Secondary | ICD-10-CM | POA: Diagnosis not present

## 2021-06-25 DIAGNOSIS — I10 Essential (primary) hypertension: Secondary | ICD-10-CM | POA: Diagnosis not present

## 2021-06-25 DIAGNOSIS — J45998 Other asthma: Secondary | ICD-10-CM | POA: Diagnosis not present

## 2021-06-25 DIAGNOSIS — J449 Chronic obstructive pulmonary disease, unspecified: Secondary | ICD-10-CM | POA: Diagnosis not present

## 2021-07-09 DIAGNOSIS — H353212 Exudative age-related macular degeneration, right eye, with inactive choroidal neovascularization: Secondary | ICD-10-CM | POA: Diagnosis not present

## 2021-07-16 DIAGNOSIS — I739 Peripheral vascular disease, unspecified: Secondary | ICD-10-CM | POA: Diagnosis not present

## 2021-07-16 DIAGNOSIS — I340 Nonrheumatic mitral (valve) insufficiency: Secondary | ICD-10-CM | POA: Diagnosis not present

## 2021-07-16 DIAGNOSIS — R0602 Shortness of breath: Secondary | ICD-10-CM | POA: Diagnosis not present

## 2021-07-16 DIAGNOSIS — E782 Mixed hyperlipidemia: Secondary | ICD-10-CM | POA: Diagnosis not present

## 2021-07-16 DIAGNOSIS — I509 Heart failure, unspecified: Secondary | ICD-10-CM | POA: Diagnosis not present

## 2021-07-16 DIAGNOSIS — I10 Essential (primary) hypertension: Secondary | ICD-10-CM | POA: Diagnosis not present

## 2021-07-16 DIAGNOSIS — I2510 Atherosclerotic heart disease of native coronary artery without angina pectoris: Secondary | ICD-10-CM | POA: Diagnosis not present

## 2021-07-23 DIAGNOSIS — N189 Chronic kidney disease, unspecified: Secondary | ICD-10-CM | POA: Diagnosis not present

## 2021-07-23 DIAGNOSIS — R609 Edema, unspecified: Secondary | ICD-10-CM | POA: Diagnosis not present

## 2021-07-23 DIAGNOSIS — I10 Essential (primary) hypertension: Secondary | ICD-10-CM | POA: Diagnosis not present

## 2021-07-23 DIAGNOSIS — E039 Hypothyroidism, unspecified: Secondary | ICD-10-CM | POA: Diagnosis not present

## 2021-10-01 DIAGNOSIS — I10 Essential (primary) hypertension: Secondary | ICD-10-CM | POA: Diagnosis not present

## 2021-10-01 DIAGNOSIS — E782 Mixed hyperlipidemia: Secondary | ICD-10-CM | POA: Diagnosis not present

## 2021-10-01 DIAGNOSIS — I2510 Atherosclerotic heart disease of native coronary artery without angina pectoris: Secondary | ICD-10-CM | POA: Diagnosis not present

## 2021-10-22 DIAGNOSIS — Z5189 Encounter for other specified aftercare: Secondary | ICD-10-CM | POA: Diagnosis not present

## 2021-10-22 DIAGNOSIS — E039 Hypothyroidism, unspecified: Secondary | ICD-10-CM | POA: Diagnosis not present

## 2021-10-22 DIAGNOSIS — F32A Depression, unspecified: Secondary | ICD-10-CM | POA: Diagnosis not present

## 2021-10-22 DIAGNOSIS — Z0001 Encounter for general adult medical examination with abnormal findings: Secondary | ICD-10-CM | POA: Diagnosis not present

## 2021-10-22 DIAGNOSIS — N390 Urinary tract infection, site not specified: Secondary | ICD-10-CM | POA: Diagnosis not present

## 2021-11-07 DIAGNOSIS — I959 Hypotension, unspecified: Secondary | ICD-10-CM | POA: Diagnosis not present

## 2021-11-07 DIAGNOSIS — R0902 Hypoxemia: Secondary | ICD-10-CM | POA: Diagnosis not present

## 2021-11-07 DIAGNOSIS — R55 Syncope and collapse: Secondary | ICD-10-CM | POA: Diagnosis not present

## 2021-11-07 DIAGNOSIS — E860 Dehydration: Secondary | ICD-10-CM | POA: Diagnosis not present

## 2021-11-07 DIAGNOSIS — R000 Tachycardia, unspecified: Secondary | ICD-10-CM | POA: Diagnosis not present

## 2021-11-08 DIAGNOSIS — E785 Hyperlipidemia, unspecified: Secondary | ICD-10-CM | POA: Diagnosis not present

## 2021-11-08 DIAGNOSIS — N189 Chronic kidney disease, unspecified: Secondary | ICD-10-CM | POA: Diagnosis not present

## 2021-11-08 DIAGNOSIS — R5381 Other malaise: Secondary | ICD-10-CM | POA: Diagnosis not present

## 2021-11-08 DIAGNOSIS — J449 Chronic obstructive pulmonary disease, unspecified: Secondary | ICD-10-CM | POA: Diagnosis not present

## 2021-11-08 DIAGNOSIS — I509 Heart failure, unspecified: Secondary | ICD-10-CM | POA: Diagnosis not present

## 2021-11-08 DIAGNOSIS — I10 Essential (primary) hypertension: Secondary | ICD-10-CM | POA: Diagnosis not present

## 2021-11-08 DIAGNOSIS — I739 Peripheral vascular disease, unspecified: Secondary | ICD-10-CM | POA: Diagnosis not present

## 2021-11-08 DIAGNOSIS — E039 Hypothyroidism, unspecified: Secondary | ICD-10-CM | POA: Diagnosis not present

## 2021-11-13 DIAGNOSIS — E119 Type 2 diabetes mellitus without complications: Secondary | ICD-10-CM | POA: Diagnosis not present

## 2021-11-13 DIAGNOSIS — E038 Other specified hypothyroidism: Secondary | ICD-10-CM | POA: Diagnosis not present

## 2021-11-13 DIAGNOSIS — D518 Other vitamin B12 deficiency anemias: Secondary | ICD-10-CM | POA: Diagnosis not present

## 2021-11-13 DIAGNOSIS — Z79899 Other long term (current) drug therapy: Secondary | ICD-10-CM | POA: Diagnosis not present

## 2021-11-13 DIAGNOSIS — E782 Mixed hyperlipidemia: Secondary | ICD-10-CM | POA: Diagnosis not present

## 2021-11-13 DIAGNOSIS — E559 Vitamin D deficiency, unspecified: Secondary | ICD-10-CM | POA: Diagnosis not present

## 2021-11-16 DIAGNOSIS — J449 Chronic obstructive pulmonary disease, unspecified: Secondary | ICD-10-CM | POA: Diagnosis not present

## 2021-11-16 DIAGNOSIS — E039 Hypothyroidism, unspecified: Secondary | ICD-10-CM | POA: Diagnosis not present

## 2021-11-16 DIAGNOSIS — I509 Heart failure, unspecified: Secondary | ICD-10-CM | POA: Diagnosis not present

## 2021-11-16 DIAGNOSIS — E785 Hyperlipidemia, unspecified: Secondary | ICD-10-CM | POA: Diagnosis not present

## 2021-11-16 DIAGNOSIS — R5381 Other malaise: Secondary | ICD-10-CM | POA: Diagnosis not present

## 2021-11-16 DIAGNOSIS — I130 Hypertensive heart and chronic kidney disease with heart failure and stage 1 through stage 4 chronic kidney disease, or unspecified chronic kidney disease: Secondary | ICD-10-CM | POA: Diagnosis not present

## 2021-11-16 DIAGNOSIS — I739 Peripheral vascular disease, unspecified: Secondary | ICD-10-CM | POA: Diagnosis not present

## 2021-11-16 DIAGNOSIS — N189 Chronic kidney disease, unspecified: Secondary | ICD-10-CM | POA: Diagnosis not present

## 2021-11-19 DIAGNOSIS — R5381 Other malaise: Secondary | ICD-10-CM | POA: Diagnosis not present

## 2021-11-19 DIAGNOSIS — I130 Hypertensive heart and chronic kidney disease with heart failure and stage 1 through stage 4 chronic kidney disease, or unspecified chronic kidney disease: Secondary | ICD-10-CM | POA: Diagnosis not present

## 2021-11-19 DIAGNOSIS — I509 Heart failure, unspecified: Secondary | ICD-10-CM | POA: Diagnosis not present

## 2021-11-19 DIAGNOSIS — E039 Hypothyroidism, unspecified: Secondary | ICD-10-CM | POA: Diagnosis not present

## 2021-11-19 DIAGNOSIS — E785 Hyperlipidemia, unspecified: Secondary | ICD-10-CM | POA: Diagnosis not present

## 2021-11-19 DIAGNOSIS — I739 Peripheral vascular disease, unspecified: Secondary | ICD-10-CM | POA: Diagnosis not present

## 2021-11-19 DIAGNOSIS — N189 Chronic kidney disease, unspecified: Secondary | ICD-10-CM | POA: Diagnosis not present

## 2021-11-19 DIAGNOSIS — J449 Chronic obstructive pulmonary disease, unspecified: Secondary | ICD-10-CM | POA: Diagnosis not present

## 2021-11-21 DIAGNOSIS — E039 Hypothyroidism, unspecified: Secondary | ICD-10-CM | POA: Diagnosis not present

## 2021-11-21 DIAGNOSIS — E785 Hyperlipidemia, unspecified: Secondary | ICD-10-CM | POA: Diagnosis not present

## 2021-11-21 DIAGNOSIS — J449 Chronic obstructive pulmonary disease, unspecified: Secondary | ICD-10-CM | POA: Diagnosis not present

## 2021-11-21 DIAGNOSIS — I739 Peripheral vascular disease, unspecified: Secondary | ICD-10-CM | POA: Diagnosis not present

## 2021-11-21 DIAGNOSIS — I130 Hypertensive heart and chronic kidney disease with heart failure and stage 1 through stage 4 chronic kidney disease, or unspecified chronic kidney disease: Secondary | ICD-10-CM | POA: Diagnosis not present

## 2021-11-21 DIAGNOSIS — N189 Chronic kidney disease, unspecified: Secondary | ICD-10-CM | POA: Diagnosis not present

## 2021-11-21 DIAGNOSIS — I509 Heart failure, unspecified: Secondary | ICD-10-CM | POA: Diagnosis not present

## 2021-11-21 DIAGNOSIS — R5381 Other malaise: Secondary | ICD-10-CM | POA: Diagnosis not present

## 2021-11-27 DIAGNOSIS — E119 Type 2 diabetes mellitus without complications: Secondary | ICD-10-CM | POA: Diagnosis not present

## 2021-11-27 DIAGNOSIS — Z79899 Other long term (current) drug therapy: Secondary | ICD-10-CM | POA: Diagnosis not present

## 2021-11-27 DIAGNOSIS — E782 Mixed hyperlipidemia: Secondary | ICD-10-CM | POA: Diagnosis not present

## 2021-11-27 DIAGNOSIS — D518 Other vitamin B12 deficiency anemias: Secondary | ICD-10-CM | POA: Diagnosis not present

## 2021-11-30 DIAGNOSIS — N189 Chronic kidney disease, unspecified: Secondary | ICD-10-CM | POA: Diagnosis not present

## 2021-11-30 DIAGNOSIS — I130 Hypertensive heart and chronic kidney disease with heart failure and stage 1 through stage 4 chronic kidney disease, or unspecified chronic kidney disease: Secondary | ICD-10-CM | POA: Diagnosis not present

## 2021-11-30 DIAGNOSIS — E785 Hyperlipidemia, unspecified: Secondary | ICD-10-CM | POA: Diagnosis not present

## 2021-11-30 DIAGNOSIS — I509 Heart failure, unspecified: Secondary | ICD-10-CM | POA: Diagnosis not present

## 2021-11-30 DIAGNOSIS — R5381 Other malaise: Secondary | ICD-10-CM | POA: Diagnosis not present

## 2021-11-30 DIAGNOSIS — J449 Chronic obstructive pulmonary disease, unspecified: Secondary | ICD-10-CM | POA: Diagnosis not present

## 2021-11-30 DIAGNOSIS — I739 Peripheral vascular disease, unspecified: Secondary | ICD-10-CM | POA: Diagnosis not present

## 2021-11-30 DIAGNOSIS — E039 Hypothyroidism, unspecified: Secondary | ICD-10-CM | POA: Diagnosis not present

## 2021-12-04 DIAGNOSIS — N189 Chronic kidney disease, unspecified: Secondary | ICD-10-CM | POA: Diagnosis not present

## 2021-12-04 DIAGNOSIS — I509 Heart failure, unspecified: Secondary | ICD-10-CM | POA: Diagnosis not present

## 2021-12-04 DIAGNOSIS — E039 Hypothyroidism, unspecified: Secondary | ICD-10-CM | POA: Diagnosis not present

## 2021-12-04 DIAGNOSIS — E785 Hyperlipidemia, unspecified: Secondary | ICD-10-CM | POA: Diagnosis not present

## 2021-12-04 DIAGNOSIS — J449 Chronic obstructive pulmonary disease, unspecified: Secondary | ICD-10-CM | POA: Diagnosis not present

## 2021-12-04 DIAGNOSIS — I739 Peripheral vascular disease, unspecified: Secondary | ICD-10-CM | POA: Diagnosis not present

## 2021-12-04 DIAGNOSIS — I130 Hypertensive heart and chronic kidney disease with heart failure and stage 1 through stage 4 chronic kidney disease, or unspecified chronic kidney disease: Secondary | ICD-10-CM | POA: Diagnosis not present

## 2021-12-04 DIAGNOSIS — R5381 Other malaise: Secondary | ICD-10-CM | POA: Diagnosis not present

## 2021-12-05 DIAGNOSIS — I130 Hypertensive heart and chronic kidney disease with heart failure and stage 1 through stage 4 chronic kidney disease, or unspecified chronic kidney disease: Secondary | ICD-10-CM | POA: Diagnosis not present

## 2021-12-05 DIAGNOSIS — R5381 Other malaise: Secondary | ICD-10-CM | POA: Diagnosis not present

## 2021-12-05 DIAGNOSIS — I739 Peripheral vascular disease, unspecified: Secondary | ICD-10-CM | POA: Diagnosis not present

## 2021-12-05 DIAGNOSIS — I509 Heart failure, unspecified: Secondary | ICD-10-CM | POA: Diagnosis not present

## 2021-12-05 DIAGNOSIS — E039 Hypothyroidism, unspecified: Secondary | ICD-10-CM | POA: Diagnosis not present

## 2021-12-05 DIAGNOSIS — E785 Hyperlipidemia, unspecified: Secondary | ICD-10-CM | POA: Diagnosis not present

## 2021-12-05 DIAGNOSIS — J449 Chronic obstructive pulmonary disease, unspecified: Secondary | ICD-10-CM | POA: Diagnosis not present

## 2021-12-05 DIAGNOSIS — N189 Chronic kidney disease, unspecified: Secondary | ICD-10-CM | POA: Diagnosis not present

## 2021-12-06 ENCOUNTER — Emergency Department: Payer: Medicare Other

## 2021-12-06 ENCOUNTER — Other Ambulatory Visit: Payer: Self-pay

## 2021-12-06 ENCOUNTER — Inpatient Hospital Stay
Admission: EM | Admit: 2021-12-06 | Discharge: 2021-12-10 | DRG: 536 | Disposition: A | Discharge status: Skilled Nursing Facility | Payer: Medicare Other | Attending: Internal Medicine | Admitting: Internal Medicine

## 2021-12-06 DIAGNOSIS — W19XXXA Unspecified fall, initial encounter: Secondary | ICD-10-CM | POA: Diagnosis not present

## 2021-12-06 DIAGNOSIS — Y92009 Unspecified place in unspecified non-institutional (private) residence as the place of occurrence of the external cause: Secondary | ICD-10-CM

## 2021-12-06 DIAGNOSIS — S32401A Unspecified fracture of right acetabulum, initial encounter for closed fracture: Secondary | ICD-10-CM | POA: Diagnosis not present

## 2021-12-06 DIAGNOSIS — Z888 Allergy status to other drugs, medicaments and biological substances status: Secondary | ICD-10-CM

## 2021-12-06 DIAGNOSIS — Z7982 Long term (current) use of aspirin: Secondary | ICD-10-CM | POA: Diagnosis not present

## 2021-12-06 DIAGNOSIS — G8911 Acute pain due to trauma: Secondary | ICD-10-CM | POA: Diagnosis not present

## 2021-12-06 DIAGNOSIS — Z79899 Other long term (current) drug therapy: Secondary | ICD-10-CM

## 2021-12-06 DIAGNOSIS — R918 Other nonspecific abnormal finding of lung field: Secondary | ICD-10-CM | POA: Diagnosis not present

## 2021-12-06 DIAGNOSIS — R0902 Hypoxemia: Secondary | ICD-10-CM

## 2021-12-06 DIAGNOSIS — Z716 Tobacco abuse counseling: Secondary | ICD-10-CM

## 2021-12-06 DIAGNOSIS — Z720 Tobacco use: Secondary | ICD-10-CM | POA: Diagnosis present

## 2021-12-06 DIAGNOSIS — S32411A Displaced fracture of anterior wall of right acetabulum, initial encounter for closed fracture: Secondary | ICD-10-CM | POA: Diagnosis not present

## 2021-12-06 DIAGNOSIS — D696 Thrombocytopenia, unspecified: Secondary | ICD-10-CM | POA: Diagnosis present

## 2021-12-06 DIAGNOSIS — F32A Depression, unspecified: Secondary | ICD-10-CM | POA: Diagnosis present

## 2021-12-06 DIAGNOSIS — F17210 Nicotine dependence, cigarettes, uncomplicated: Secondary | ICD-10-CM | POA: Diagnosis present

## 2021-12-06 DIAGNOSIS — I129 Hypertensive chronic kidney disease with stage 1 through stage 4 chronic kidney disease, or unspecified chronic kidney disease: Secondary | ICD-10-CM | POA: Diagnosis present

## 2021-12-06 DIAGNOSIS — I6782 Cerebral ischemia: Secondary | ICD-10-CM | POA: Diagnosis not present

## 2021-12-06 DIAGNOSIS — E039 Hypothyroidism, unspecified: Secondary | ICD-10-CM | POA: Diagnosis not present

## 2021-12-06 DIAGNOSIS — W010XXA Fall on same level from slipping, tripping and stumbling without subsequent striking against object, initial encounter: Secondary | ICD-10-CM | POA: Diagnosis present

## 2021-12-06 DIAGNOSIS — I10 Essential (primary) hypertension: Secondary | ICD-10-CM | POA: Diagnosis not present

## 2021-12-06 DIAGNOSIS — Z1152 Encounter for screening for COVID-19: Secondary | ICD-10-CM | POA: Diagnosis not present

## 2021-12-06 DIAGNOSIS — I714 Abdominal aortic aneurysm, without rupture, unspecified: Secondary | ICD-10-CM | POA: Diagnosis present

## 2021-12-06 DIAGNOSIS — R52 Pain, unspecified: Secondary | ICD-10-CM | POA: Diagnosis present

## 2021-12-06 DIAGNOSIS — S32591A Other specified fracture of right pubis, initial encounter for closed fracture: Secondary | ICD-10-CM | POA: Diagnosis not present

## 2021-12-06 DIAGNOSIS — E785 Hyperlipidemia, unspecified: Secondary | ICD-10-CM | POA: Diagnosis not present

## 2021-12-06 DIAGNOSIS — S32119A Unspecified Zone I fracture of sacrum, initial encounter for closed fracture: Secondary | ICD-10-CM | POA: Diagnosis not present

## 2021-12-06 DIAGNOSIS — J209 Acute bronchitis, unspecified: Secondary | ICD-10-CM | POA: Insufficient documentation

## 2021-12-06 DIAGNOSIS — N1831 Chronic kidney disease, stage 3a: Secondary | ICD-10-CM | POA: Diagnosis not present

## 2021-12-06 DIAGNOSIS — S329XXA Fracture of unspecified parts of lumbosacral spine and pelvis, initial encounter for closed fracture: Secondary | ICD-10-CM | POA: Diagnosis present

## 2021-12-06 DIAGNOSIS — S0990XA Unspecified injury of head, initial encounter: Secondary | ICD-10-CM | POA: Diagnosis not present

## 2021-12-06 DIAGNOSIS — E7800 Pure hypercholesterolemia, unspecified: Secondary | ICD-10-CM | POA: Diagnosis not present

## 2021-12-06 DIAGNOSIS — S0181XA Laceration without foreign body of other part of head, initial encounter: Secondary | ICD-10-CM | POA: Diagnosis present

## 2021-12-06 DIAGNOSIS — I700 Atherosclerosis of aorta: Secondary | ICD-10-CM | POA: Diagnosis not present

## 2021-12-06 DIAGNOSIS — S199XXA Unspecified injury of neck, initial encounter: Secondary | ICD-10-CM | POA: Diagnosis not present

## 2021-12-06 DIAGNOSIS — H9190 Unspecified hearing loss, unspecified ear: Secondary | ICD-10-CM | POA: Diagnosis present

## 2021-12-06 DIAGNOSIS — M533 Sacrococcygeal disorders, not elsewhere classified: Secondary | ICD-10-CM | POA: Diagnosis not present

## 2021-12-06 DIAGNOSIS — Z23 Encounter for immunization: Secondary | ICD-10-CM | POA: Diagnosis not present

## 2021-12-06 DIAGNOSIS — M47812 Spondylosis without myelopathy or radiculopathy, cervical region: Secondary | ICD-10-CM | POA: Diagnosis not present

## 2021-12-06 DIAGNOSIS — Z79890 Hormone replacement therapy (postmenopausal): Secondary | ICD-10-CM

## 2021-12-06 DIAGNOSIS — Z743 Need for continuous supervision: Secondary | ICD-10-CM | POA: Diagnosis not present

## 2021-12-06 DIAGNOSIS — R0602 Shortness of breath: Secondary | ICD-10-CM | POA: Diagnosis not present

## 2021-12-06 DIAGNOSIS — J40 Bronchitis, not specified as acute or chronic: Secondary | ICD-10-CM | POA: Diagnosis present

## 2021-12-06 DIAGNOSIS — S32431A Displaced fracture of anterior column [iliopubic] of right acetabulum, initial encounter for closed fracture: Secondary | ICD-10-CM | POA: Diagnosis not present

## 2021-12-06 DIAGNOSIS — S3219XA Other fracture of sacrum, initial encounter for closed fracture: Secondary | ICD-10-CM | POA: Diagnosis not present

## 2021-12-06 DIAGNOSIS — S32599A Other specified fracture of unspecified pubis, initial encounter for closed fracture: Secondary | ICD-10-CM | POA: Diagnosis present

## 2021-12-06 DIAGNOSIS — N1832 Chronic kidney disease, stage 3b: Secondary | ICD-10-CM | POA: Diagnosis present

## 2021-12-06 LAB — CBC WITH DIFFERENTIAL/PLATELET
Abs Immature Granulocytes: 0.05 10*3/uL (ref 0.00–0.07)
Basophils Absolute: 0 10*3/uL (ref 0.0–0.1)
Basophils Relative: 0 %
Eosinophils Absolute: 0.1 10*3/uL (ref 0.0–0.5)
Eosinophils Relative: 1 %
HCT: 27.7 % — ABNORMAL LOW (ref 36.0–46.0)
Hemoglobin: 8.9 g/dL — ABNORMAL LOW (ref 12.0–15.0)
Immature Granulocytes: 1 %
Lymphocytes Relative: 13 %
Lymphs Abs: 1.4 10*3/uL (ref 0.7–4.0)
MCH: 32.7 pg (ref 26.0–34.0)
MCHC: 32.1 g/dL (ref 30.0–36.0)
MCV: 101.8 fL — ABNORMAL HIGH (ref 80.0–100.0)
Monocytes Absolute: 0.9 10*3/uL (ref 0.1–1.0)
Monocytes Relative: 9 %
Neutro Abs: 8.2 10*3/uL — ABNORMAL HIGH (ref 1.7–7.7)
Neutrophils Relative %: 76 %
Platelets: 118 10*3/uL — ABNORMAL LOW (ref 150–400)
RBC: 2.72 MIL/uL — ABNORMAL LOW (ref 3.87–5.11)
RDW: 14.6 % (ref 11.5–15.5)
WBC: 10.7 10*3/uL — ABNORMAL HIGH (ref 4.0–10.5)
nRBC: 0 % (ref 0.0–0.2)

## 2021-12-06 LAB — COMPREHENSIVE METABOLIC PANEL
ALT: 14 U/L (ref 0–44)
AST: 21 U/L (ref 15–41)
Albumin: 2.6 g/dL — ABNORMAL LOW (ref 3.5–5.0)
Alkaline Phosphatase: 65 U/L (ref 38–126)
Anion gap: 7 (ref 5–15)
BUN: 42 mg/dL — ABNORMAL HIGH (ref 8–23)
CO2: 24 mmol/L (ref 22–32)
Calcium: 9.2 mg/dL (ref 8.9–10.3)
Chloride: 102 mmol/L (ref 98–111)
Creatinine, Ser: 1.27 mg/dL — ABNORMAL HIGH (ref 0.44–1.00)
GFR, Estimated: 42 mL/min — ABNORMAL LOW (ref >60)
Glucose, Bld: 114 mg/dL — ABNORMAL HIGH (ref 70–99)
Potassium: 4.1 mmol/L (ref 3.5–5.1)
Sodium: 133 mmol/L — ABNORMAL LOW (ref 135–145)
Total Bilirubin: 0.6 mg/dL (ref 0.3–1.2)
Total Protein: 5.5 g/dL — ABNORMAL LOW (ref 6.5–8.1)

## 2021-12-06 LAB — TECHNOLOGIST SMEAR REVIEW: Plt Morphology: NORMAL

## 2021-12-06 LAB — PROTIME-INR
INR: 1.1 (ref 0.8–1.2)
Prothrombin Time: 13.8 seconds (ref 11.4–15.2)

## 2021-12-06 LAB — TYPE AND SCREEN
ABO/RH(D): A POS
Antibody Screen: NEGATIVE

## 2021-12-06 LAB — APTT: aPTT: 27 seconds (ref 24–36)

## 2021-12-06 LAB — SARS CORONAVIRUS 2 BY RT PCR: SARS Coronavirus 2 by RT PCR: NEGATIVE

## 2021-12-06 LAB — LACTATE DEHYDROGENASE: LDH: 175 U/L (ref 98–192)

## 2021-12-06 LAB — BRAIN NATRIURETIC PEPTIDE: B Natriuretic Peptide: 155.1 pg/mL — ABNORMAL HIGH (ref 0.0–100.0)

## 2021-12-06 MED ORDER — TETANUS-DIPHTH-ACELL PERTUSSIS 5-2.5-18.5 LF-MCG/0.5 IM SUSY
0.5000 mL | PREFILLED_SYRINGE | Freq: Once | INTRAMUSCULAR | Status: AC
  Administered 2021-12-06: 0.5 mL via INTRAMUSCULAR
  Filled 2021-12-06: qty 0.5

## 2021-12-06 MED ORDER — CARVEDILOL 25 MG PO TABS
25.0000 mg | ORAL_TABLET | Freq: Two times a day (BID) | ORAL | Status: DC
  Administered 2021-12-06 – 2021-12-10 (×9): 25 mg via ORAL
  Filled 2021-12-06 (×7): qty 1
  Filled 2021-12-06: qty 4
  Filled 2021-12-06: qty 1

## 2021-12-06 MED ORDER — AZITHROMYCIN 500 MG PO TABS
500.0000 mg | ORAL_TABLET | Freq: Every day | ORAL | Status: AC
  Administered 2021-12-06: 500 mg via ORAL
  Filled 2021-12-06: qty 1

## 2021-12-06 MED ORDER — HYDRALAZINE HCL 20 MG/ML IJ SOLN: 5.0000 mg | INTRAMUSCULAR | Status: DC | PRN

## 2021-12-06 MED ORDER — OXYCODONE-ACETAMINOPHEN 5-325 MG PO TABS
1.0000 | ORAL_TABLET | ORAL | Status: DC | PRN
  Administered 2021-12-06 – 2021-12-09 (×5): 1 via ORAL
  Filled 2021-12-06 (×5): qty 1

## 2021-12-06 MED ORDER — LIDOCAINE 5 % EX PTCH
1.0000 | MEDICATED_PATCH | CUTANEOUS | Status: DC
  Administered 2021-12-06 – 2021-12-10 (×5): 1 via TRANSDERMAL
  Filled 2021-12-06 (×5): qty 1

## 2021-12-06 MED ORDER — ENALAPRIL MALEATE 2.5 MG PO TABS
2.5000 mg | ORAL_TABLET | Freq: Every day | ORAL | Status: DC
  Administered 2021-12-06 – 2021-12-08 (×3): 2.5 mg via ORAL
  Filled 2021-12-06 (×4): qty 1

## 2021-12-06 MED ORDER — MORPHINE SULFATE (PF) 2 MG/ML IV SOLN: 0.5000 mg | INTRAVENOUS | Status: DC | PRN

## 2021-12-06 MED ORDER — ONDANSETRON HCL 4 MG/2ML IJ SOLN: 4.0000 mg | Freq: Three times a day (TID) | INTRAMUSCULAR | Status: DC | PRN

## 2021-12-06 MED ORDER — NICOTINE 21 MG/24HR TD PT24
21.0000 mg | MEDICATED_PATCH | Freq: Every day | TRANSDERMAL | Status: DC
  Administered 2021-12-09 – 2021-12-10 (×2): 21 mg via TRANSDERMAL
  Filled 2021-12-06 (×5): qty 1

## 2021-12-06 MED ORDER — AZITHROMYCIN 250 MG PO TABS
250.0000 mg | ORAL_TABLET | Freq: Every day | ORAL | Status: AC
  Administered 2021-12-07 – 2021-12-10 (×4): 250 mg via ORAL
  Filled 2021-12-06 (×4): qty 1

## 2021-12-06 MED ORDER — DM-GUAIFENESIN ER 30-600 MG PO TB12: 1.0000 | ORAL_TABLET | Freq: Two times a day (BID) | ORAL | Status: DC | PRN

## 2021-12-06 MED ORDER — CALCIUM CARBONATE ANTACID 500 MG PO CHEW: 1.0000 | CHEWABLE_TABLET | Freq: Two times a day (BID) | ORAL | Status: DC | PRN

## 2021-12-06 MED ORDER — ACETAMINOPHEN 500 MG PO TABS
1000.0000 mg | ORAL_TABLET | Freq: Once | ORAL | Status: AC
  Administered 2021-12-06: 1000 mg via ORAL
  Filled 2021-12-06: qty 2

## 2021-12-06 MED ORDER — ACETAMINOPHEN 325 MG PO TABS: 650.0000 mg | ORAL_TABLET | Freq: Four times a day (QID) | ORAL | Status: DC | PRN

## 2021-12-06 MED ORDER — SIMVASTATIN 40 MG PO TABS
40.0000 mg | ORAL_TABLET | Freq: Every day | ORAL | Status: DC
  Administered 2021-12-06 – 2021-12-10 (×5): 40 mg via ORAL
  Filled 2021-12-06 (×6): qty 1

## 2021-12-06 MED ORDER — CILOSTAZOL 100 MG PO TABS
100.0000 mg | ORAL_TABLET | Freq: Two times a day (BID) | ORAL | Status: DC
  Administered 2021-12-06 – 2021-12-10 (×9): 100 mg via ORAL
  Filled 2021-12-06 (×10): qty 1

## 2021-12-06 MED ORDER — SM DOUBLE ANTIBIOTIC 500-10000 UNIT/GM EX OINT
1.0000 | TOPICAL_OINTMENT | Freq: Two times a day (BID) | CUTANEOUS | Status: DC
  Administered 2021-12-06 – 2021-12-10 (×8): 1 via TOPICAL
  Filled 2021-12-06: qty 1
  Filled 2021-12-06: qty 28.4

## 2021-12-06 MED ORDER — BUPROPION HCL ER (XL) 150 MG PO TB24
150.0000 mg | ORAL_TABLET | Freq: Every day | ORAL | Status: DC
  Administered 2021-12-07 – 2021-12-10 (×4): 150 mg via ORAL
  Filled 2021-12-06 (×4): qty 1

## 2021-12-06 MED ORDER — FOLIC ACID 1 MG PO TABS
1.0000 mg | ORAL_TABLET | Freq: Every day | ORAL | Status: DC
  Administered 2021-12-07 – 2021-12-10 (×4): 1 mg via ORAL
  Filled 2021-12-06 (×4): qty 1

## 2021-12-06 MED ORDER — LEVOTHYROXINE SODIUM 125 MCG PO TABS
125.0000 ug | ORAL_TABLET | Freq: Every morning | ORAL | Status: DC
  Administered 2021-12-07 – 2021-12-10 (×4): 125 ug via ORAL
  Filled 2021-12-06 (×4): qty 1

## 2021-12-06 MED ORDER — METHOCARBAMOL 500 MG PO TABS
500.0000 mg | ORAL_TABLET | Freq: Three times a day (TID) | ORAL | Status: DC | PRN
  Administered 2021-12-07: 500 mg via ORAL
  Filled 2021-12-06: qty 1

## 2021-12-06 MED ORDER — ALBUTEROL SULFATE (2.5 MG/3ML) 0.083% IN NEBU: 3.0000 mL | INHALATION_SOLUTION | RESPIRATORY_TRACT | Status: DC | PRN

## 2021-12-06 NOTE — ED Triage Notes (Signed)
Pt BIB [facility] following a witnessed mechanical fall while using walker to ambulate to the bathroom. Presents with laceration to forehead. Bleeding controlled with dressing. No blood thinner. No LOC.

## 2021-12-06 NOTE — Progress Notes (Signed)
Patient has arrived to the unit at this time. A&O x4. No distress noted. Patient oriented to staff and unit and equipment. Safety precautions in place. Tele applied and called into [facility]. O2 applied via [place] for [place] 88% on RA. Call light within reach. Safety education provided. Plan of care reviewed with patient and she agree. Will continue to monitor and endorse.

## 2021-12-06 NOTE — Assessment & Plan Note (Signed)
Synthroid 

## 2021-12-06 NOTE — Assessment & Plan Note (Signed)
-   IV hydralazine as needed -Coreg, enalapril

## 2021-12-06 NOTE — Assessment & Plan Note (Addendum)
CT head negative.  CT of C-spine negative for acute bony fracture, but showed degenerative disc disease. - Fall precaution -PT

## 2021-12-06 NOTE — Assessment & Plan Note (Signed)
-   Zocor 

## 2021-12-06 NOTE — ED Notes (Signed)
Pt now resting quietly in her room with eyes closed, resp even and regular. O2 in place via [place].  Rails up for safety.  Call light in reach.

## 2021-12-06 NOTE — ED Provider Notes (Addendum)
[facility] Provider Note    Event Date/Time   First MD Initiated Contact with Patient 12/06/21 0222     (approximate)   History   Fall   HPI  Bianca Horton is a 86 y.o. female with past medical history of hypertension, abdominal aortic aneurysm, CHF, CKD who presents after a fall.  Patient resides in a nursing facility.  She got up to use the bathroom was using her walker when she lost her balance and fell onto her right side.  She denies loss of consciousness denies nausea vomiting.  Complains of pain in her tailbone.  Denies numbness tingling weakness.  Denies chest or abdominal pain.  She denies being on blood thinners.     Past Medical History:  Diagnosis Date   High cholesterol    Hypertension    Thyroid disease     Patient Active Problem List   Diagnosis Date Noted   Essential hypertension 10/19/2019   Hyperlipidemia 10/19/2019   DJD (degenerative joint disease) 10/19/2019   AAA (abdominal aortic aneurysm) without rupture (HCC) 10/18/2019     Physical Exam  Triage Vital Signs: ED Triage Vitals [12/06/21 0225]  Enc Vitals Group     BP 94/81     Pulse Rate 78     Resp 15     Temp 97.9 F (36.6 C)     Temp Source Oral     SpO2 92 %     Weight      Height      Head Circumference      Peak Flow      Pain Score      Pain Loc      Pain Edu?      Excl. in [place]?     Most recent vital signs: Vitals:   12/06/21 0230 12/06/21 0608  BP: 100/61 128/65  Pulse: 73 81  Resp: 15 15  Temp:  98.2 F (36.8 C)  SpO2: 93% 100%     General: Awake, no distress.  CV:  Good peripheral perfusion.  Ecchymosis that appears old scattered in bilateral lower extremities Resp:  Normal effort.  No increased work of breathing Abd:  No distention.  Abdomen soft nontender Neuro:             Awake, Alert, Oriented x 3 patient very hard of hearing Other:  There is a approximately 1 cm well approximated laceration on the right forehead with dried blood  on the right temple PERRLA, EOMI Pelvis is stable, no tenderness of the hips, able to range hips bilaterally but complains of pain in the tailbone with range No focal bony tenderness of the thighs knees or lower legs No midline C, T or L-spine tenderness   ED Results / Procedures / Treatments  Labs (all labs ordered are listed, but only abnormal results are displayed) Labs Reviewed  COMPREHENSIVE METABOLIC PANEL - Abnormal; Notable for the following components:      Result Value   Sodium 133 (*)    Glucose, Bld 114 (*)    BUN 42 (*)    Creatinine, Ser 1.27 (*)    Total Protein 5.5 (*)    Albumin 2.6 (*)    GFR, Estimated 42 (*)    All other components within normal limits  CBC WITH DIFFERENTIAL/PLATELET - Abnormal; Notable for the following components:   WBC 10.7 (*)    RBC 2.72 (*)    Hemoglobin 8.9 (*)    HCT 27.7 (*)  MCV 101.8 (*)    Platelets 118 (*)    Neutro Abs 8.2 (*)    All other components within normal limits  SARS CORONAVIRUS 2 BY RT PCR  PROTIME-INR  APTT  BRAIN NATRIURETIC PEPTIDE  TYPE AND SCREEN     EKG     RADIOLOGY I reviewed and interpreted the CT scan of the brain which does not show any acute intracranial process    PROCEDURES:  Critical Care performed: No  .1-3 Lead EKG Interpretation  Performed by: Georga Hacking, MD Authorized by: Georga Hacking, MD     Interpretation: normal     ECG rate assessment: normal     Rhythm: sinus rhythm     Ectopy: none     Conduction: normal     The patient is on the cardiac monitor to evaluate for evidence of arrhythmia and/or significant heart rate changes.   MEDICATIONS ORDERED IN ED: Medications  Tdap (BOOSTRIX) injection 0.5 mL (0.5 mLs Intramuscular Given 12/06/21 0318)  acetaminophen (TYLENOL) tablet 1,000 mg (1,000 mg Oral Given 12/06/21 0317)     IMPRESSION / MDM / ASSESSMENT AND PLAN / ED COURSE  I reviewed the triage vital signs and the nursing notes.                               Patient's presentation is most consistent with acute presentation with potential threat to life or bodily function.  Differential diagnosis includes, but is not limited to, intracranial hemorrhage, skull fracture, C-spine fracture, laceration, concussion  Patient is a 86 year old female presents after mechanical fall.  Typically walks with a walker resides in a nursing facility.  Lost her balance while using her walker tonight.  Did hit her head and fell onto her right side.  She has a laceration to the right forearm that is well approximated some dried blood on her right temple.  Otherwise there are no acute signs of trauma.  She does have some ecchymosis on her lower extremities but this looks old.  She is able to range both hips does complain of some pain in the tailbone region.  There is no focal C, T or L-spine tenderness on my evaluation.  Plan to obtain CT head and C-spine.  We will x-ray the pelvis and coccyx.  We will treat pain with Tylenol.  We will repair her laceration with glue.  We will update tetanus as I am not sure when her last tetanus shot was cannot determine this from her chart.  Patient's initial imaging is negative including x-ray of the pelvis and sacrum.  Attempted to ambulate the patient but she was in too much discomfort.  We will thus obtain a CT of the pelvis without contrast to further evaluate for occult pelvic fracture.   Pelvic CT shows subtle abnormalities including fracture of the sacral ala pubic bone and acetabulum.  There is small hemoperitoneum anterior the bladder and in the pelvis.  We will send off labs including a type and screen.  Discussed with Dr. Okey Dupre with orthopedics who notes that these are insufficiency fractures and she will not need operative treatment but just pain control and likely physical therapy possible rehab.  Patient will need admission as she is in too much pain to ambulate.  She can be partial weightbearing.  Dr. Okey Dupre with  Ortho to see the patient.  Of note patient did desaturate to the low 90s.  Placed on 3  L nasal cannula.  Will obtain chest x-ray.  She has no cardiopulmonary complaints at this time.  Does have a history of CHF and CKD.  Chest x-ray is read as interstitial prominence suggestive of bronchitis.  Patient has no cough.  Unclear if this could be pulmonary edema.  BNP is pending does not have peripheral edema.  Are notable for hemoglobin of 8.9, was 9.82 years ago which is the last hemoglobin in our system.  Type and screen has been sent.   FINAL CLINICAL IMPRESSION(S) / ED DIAGNOSES   Final diagnoses:  Closed nondisplaced fracture of right acetabulum, unspecified portion of acetabulum, initial encounter (HCC)     Rx / DC Orders   ED Discharge Orders     None        Note:  This document was prepared using Dragon voice recognition software and may include unintentional dictation errors.   Georga Hacking, MD 12/06/21 [id number]    Georga Hacking, MD 12/06/21 [phone]

## 2021-12-06 NOTE — ED Notes (Signed)
Attempted to ambulate pt with walker at [facility] request.  Pt able to stand, but unable to take a step with left foot first, stating that pain is too much.  Pt able to sit down liberally with a groan and to fall back onto back stating "even sitting up hurts".  Report given to Dr Starleen Blue.

## 2021-12-06 NOTE — Consult Note (Signed)
[facility] Nurse Consult Note: Reason for Consult: Consult requested to provide topical treatment recommendations for right head.  Pt had a laceration occur to this location prior to admission related to a fall.  Wound type: Right upper forehead with full thickness wound; 1X1.8X.2cm, dark red-black old clotted blood in the wound, surrounded by 3 cm raised hematoma, dark red-purple with bruising.  Dressing procedure/placement/frequency: Topical treatment orders provided for bedside nurses to perform to promote moist healing: Apply antibiotic ointment to right forehead BID and cover with 2X2 and tape. Please re-consult if further assistance is needed.  Thank-you,  Julien Girt MSN, [place], [place], [place], [place]

## 2021-12-06 NOTE — ED Notes (Signed)
Pt brought to ED rm 32, this RN now assuming care. 

## 2021-12-06 NOTE — Assessment & Plan Note (Signed)
Intractable pain due to right acetabular fracture, inferior right pubic ramus fracture and right sacral ala fracture.  Consulted with Dr. Sharlet Salina of Ortho.  Patient does not need surgery. He recommend pain control and partial weightbearing (25-50%) right lower extremity with PT.  Follow-up in clinic in 3 to 4 weeks with repeat x-rays of pelvis/right hip.  -will place in med-surg bed for obs -As needed Percocet, morphine, Tylenol -Lidoderm -As needed Robaxin

## 2021-12-06 NOTE — Assessment & Plan Note (Signed)
See above

## 2021-12-06 NOTE — Assessment & Plan Note (Signed)
-  Nicotine patch 

## 2021-12-06 NOTE — Assessment & Plan Note (Signed)
Platelet 118.  Patient had platelet of 250 on 02/01/2019 -Check LDH and peripheral smear

## 2021-12-06 NOTE — Consult Note (Signed)
ORTHOPAEDIC CONSULTATION  REQUESTING PHYSICIAN: Ivor Costa, MD  Chief Complaint: Right hip pain  HPI: Bianca Horton is a 86 y.o. female who complains of right hip pain after mechanical fall. The pain is sharp in character. The pain is worse with movement and better with rest. Denies any numbness, tingling or constitutional symptoms.  Patient lives in an assisted living facility. Medical history notable for hypertension, abdominal aortic aneurysm, CHF, CKD.  CT scan showed presence of diffuse osteopenia, nondisplaced fracture of right acetabulum anterior column, nondisplaced right inferior pubic ramus fracture.  Orthopedics was consulted regarding further management  Past Medical History:  Diagnosis Date   High cholesterol    Hypertension    Thyroid disease    Past Surgical History:  Procedure Laterality Date   GALLBLADDER SURGERY     Social History   Socioeconomic History   Marital status: Single    Spouse name: Not on file   Number of children: Not on file   Years of education: Not on file   Highest education level: Not on file  Occupational History   Not on file  Tobacco Use   Smoking status: Light Smoker   Smokeless tobacco: Never   Tobacco comments:    process of quitting   Substance and Sexual Activity   Alcohol use: Never   Drug use: Never   Sexual activity: Not on file  Other Topics Concern   Not on file  Social History Narrative   Not on file   Social Determinants of Health   Financial Resource Strain: Not on file  Food Insecurity: Not on file  Transportation Needs: Not on file  Physical Activity: Not on file  Stress: Not on file  Social Connections: Not on file   No family history on file. Allergies  Allergen Reactions   Clopidogrel     Other reaction(s): brusing skin   Sertraline Hcl Nausea And Vomiting    Other reaction(s): Nausea, Vomiting, Diarrhea, sleepwalking, Nausea, Vomiting, Diarrhea, sleepwalking   Prior to Admission medications    Medication Sig Start Date End Date Taking? Authorizing Provider  acetaminophen (TYLENOL) 500 MG tablet Take 1,000 mg by mouth 3 (three) times daily as needed for mild pain.   Yes [provider]  aspirin 81 MG chewable tablet Chew 325 mg by mouth daily.   Yes [provider]  buPROPion (WELLBUTRIN XL) 150 MG 24 hr tablet  11/27/15  Yes [provider]  calcium carbonate (TUMS - DOSED IN MG ELEMENTAL CALCIUM) 500 MG chewable tablet Chew 1 tablet by mouth 2 (two) times daily as needed for indigestion or heartburn.   Yes [provider]  carvedilol (COREG) 25 MG tablet Take 25 mg by mouth 2 (two) times daily with a meal. 07/22/13  Yes [provider]  cilostazol (PLETAL) 100 MG tablet Take 100 mg by mouth 2 (two) times daily.   Yes [provider]  enalapril (VASOTEC) 2.5 MG tablet Take 2.5 mg by mouth daily. 11/12/21  Yes [provider]  escitalopram (LEXAPRO) 10 MG tablet Take 10 mg by mouth daily. 11/19/21  Yes [provider]  folic acid (FOLVITE) 1 MG tablet Take 1 mg by mouth daily.   Yes [provider]  levothyroxine (SYNTHROID) 125 MCG tablet Take 125 mcg by mouth every morning. 01/24/21  Yes [provider]  Menthol, Topical Analgesic, (ICY HOT ADVANCED RELIEF EX) Apply 1 Application topically as needed.   Yes [provider]  simvastatin (ZOCOR) 40 MG tablet  07/22/13  Yes [provider]  Multiple Vitamins-Minerals (PRESERVISION/LUTEIN) CAPS Take by mouth. Patient not taking: Reported on 10/18/2019    [provider]   DG Chest Portable 1 View  Result Date: 12/06/2021 CLINICAL DATA:  86 year old female with history of shortness of breath and hypoxia. EXAM: PORTABLE CHEST 1 VIEW COMPARISON:  Chest x-ray 12/20/2009. FINDINGS: Lung volumes are low. Diffuse interstitial prominence and peribronchial cuffing. No consolidative airspace disease. No pleural effusions. No pneumothorax.  No pulmonary nodule or mass noted. Pulmonary vasculature and the cardiomediastinal silhouette are within normal limits. Atherosclerotic calcifications are noted in the thoracic aorta. IMPRESSION: 1. Diffuse interstitial prominence and peribronchial cuffing concerning for an acute bronchitis. 2. Aortic atherosclerosis. Electronically Signed   By: Vinnie Langton M.D.   On: 12/06/2021 05:53   CT PELVIS WO CONTRAST  Result Date: 12/06/2021 CLINICAL DATA:  Hip trauma. Fracture suspected. Sacral pain following a fall. EXAM: CT PELVIS WITHOUT CONTRAST TECHNIQUE: Multidetector CT imaging of the pelvis was performed following the standard protocol without intravenous contrast. RADIATION DOSE REDUCTION: This exam was performed according to the departmental dose-optimization program which includes automated exposure control, adjustment of the mA and/or kV according to patient size and/or use of iterative reconstruction technique. COMPARISON:  CT without contrast 12/16/2009 FINDINGS: Urinary Tract: The pelvic ureters are decompressed and free of calcifications. The bladder is nondistended but could be thickened. Bowel:  Unremarkable visualized pelvic bowel loops. Vascular/Lymphatic: The distal abdominal aorta, iliac and proximal femoral arteries are heavily calcified and increasingly calcified since 12 years ago. There previously was a 3 cm AAA, which today measures at least 3.3 cm but it is not fully included on this exam and should be fully imaged under ultrasound to assess its full size. No iliac aneurysm is seen. The degree of calcification likely indicates flow-limiting iliofemoral stenoses bilaterally. There are no enlarged inguinopelvic nodes. Reproductive:  The uterus is absent.  The ovaries are not enlarged. Other: There is a small pelvic hemoperitoneum anterior to the bladder, measuring 5.3 x 2.7 by 4.2 cm. There is trace ascites in the posterior deep pelvis. Small but wide mouth umbilical fat hernia is present.  There are tiny inguinal fat hernias. There is no incarcerated hernia. Musculoskeletal: Generalized osteopenia. There are 2 tapered bands of sclerosis extending longitudinally from the top of the femoral heads to the femoral head and neck junctions symmetrically. This is probably preserved bone mineralization surrounded by osteopenia and was present 12 years ago and not significantly different in appearance. Component of osteonecrosis however, is possible, but there has been no interval overlying articular surface subsidence on either side. There is an acute fracture with anterior comminution involving the anterior column of the right acetabulum and a small right hip joint hemarthrosis, with nondisplaced fracture of the right pubic bone and the posterior aspect of the right inferior pubic ramus and with healed fracture deformities of the left anterior acetabulum and inferior pubic ramus. There is an acute transverse fracture through the S4 segment through the ventral body portion as well as the dorsal roof, nondisplaced except for slight cortical buckling of the ventral S4 body. There is additional nondisplaced linear fracture anteriorly of the right ala of the sacrum alongside the SI joint. There is no proximal femoral or further sacral fracture. There is mild symmetric hip DJD, spurring of the SI joints, lumbar hypertrophic facet arthropathy at L4-5 and L5-S1 and spurring and vacuum phenomenon of the SI joints. IMPRESSION: 1. osteopenia with intra-articular transverse fracture of the anterior  column of right acetabulum, with nondisplaced comminution anteriorly and a small hemarthrosis of the right hip joint. 2. Nondisplaced fracture through the right pubic bone and posterior aspect of the right inferior pubic ramus. 3. Nondisplaced fracture through the ventral and dorsal S4 level and intra-articular fracture of the anterior right sacral ala, also nondisplaced. 4. Small hemoperitoneum anterior to the bladder. Trace  posterior deep pelvic fluid. 5. Degenerative changes. 6. Cystitis versus bladder nondistention. 7. Heavy vascular calcification with partially visible AAA at least 3.3 cm. Outpatient nonemergent ultrasound recommended to assess the full extent of the aneurysm. Previously this was 3 cm. 8. Shallow but wide mouth umbilical fat hernia. Electronically Signed   By: Telford Nab M.D.   On: 12/06/2021 05:11   DG Sacrum/Coccyx  Result Date: 12/06/2021 CLINICAL DATA:  Fall, tailbone pain EXAM: SACRUM AND COCCYX - 2+ VIEW COMPARISON:  None Available. FINDINGS: There is no evidence of fracture or other focal bone lesions. No displaced sacrococcygeal fracture. IMPRESSION: Negative. Electronically Signed   By: Julian Hy M.D.   On: 12/06/2021 03:22   DG Pelvis Portable  Result Date: 12/06/2021 CLINICAL DATA:  Fall, tailbone pain EXAM: PORTABLE PELVIS 1-2 VIEWS COMPARISON:  None Available. FINDINGS: No fracture or dislocation is seen. Bilateral hip joint spaces are preserved. Visualized bony pelvis appears intact. Old left superior and inferior pubic rami fractures. Vascular calcifications. IMPRESSION: Negative. Electronically Signed   By: Julian Hy M.D.   On: 12/06/2021 03:21   CT HEAD WO CONTRAST (5MM)  Result Date: 12/06/2021 CLINICAL DATA:  Fall, forehead laceration EXAM: CT HEAD WITHOUT CONTRAST CT CERVICAL SPINE WITHOUT CONTRAST TECHNIQUE: Multidetector CT imaging of the head and cervical spine was performed following the standard protocol without intravenous contrast. Multiplanar CT image reconstructions of the cervical spine were also generated. RADIATION DOSE REDUCTION: This exam was performed according to the departmental dose-optimization program which includes automated exposure control, adjustment of the mA and/or kV according to patient size and/or use of iterative reconstruction technique. COMPARISON:  None Available. FINDINGS: CT HEAD FINDINGS Brain: No evidence of acute infarction,  hemorrhage, hydrocephalus, extra-axial collection or mass lesion/mass effect. Subcortical white matter and periventricular small vessel ischemic changes. Vascular: No hyperdense vessel or unexpected calcification. Skull: Normal. Negative for fracture or focal lesion. Sinuses/Orbits: The visualized paranasal sinuses are essentially clear. The mastoid air cells are unopacified. Other: None. CT CERVICAL SPINE FINDINGS Alignment: Normal cervical lordosis. Skull base and vertebrae: No acute fracture. No primary bone lesion or focal pathologic process. Soft tissues and spinal canal: No prevertebral fluid or swelling. No visible canal hematoma. Disc levels: Mild degenerative changes of the mid cervical spine. Spinal canal is patent. Upper chest: Visualized lung apices are clear. Other: None. IMPRESSION: No evidence of acute intracranial abnormality. Small vessel ischemic changes. No traumatic injury to the cervical spine. Mild degenerative changes. Electronically Signed   By: Julian Hy M.D.   On: 12/06/2021 03:20   CT Cervical Spine Wo Contrast  Result Date: 12/06/2021 CLINICAL DATA:  Fall, forehead laceration EXAM: CT HEAD WITHOUT CONTRAST CT CERVICAL SPINE WITHOUT CONTRAST TECHNIQUE: Multidetector CT imaging of the head and cervical spine was performed following the standard protocol without intravenous contrast. Multiplanar CT image reconstructions of the cervical spine were also generated. RADIATION DOSE REDUCTION: This exam was performed according to the departmental dose-optimization program which includes automated exposure control, adjustment of the mA and/or kV according to patient size and/or use of iterative reconstruction technique. COMPARISON:  None Available. FINDINGS: CT HEAD FINDINGS  Brain: No evidence of acute infarction, hemorrhage, hydrocephalus, extra-axial collection or mass lesion/mass effect. Subcortical white matter and periventricular small vessel ischemic changes. Vascular: No  hyperdense vessel or unexpected calcification. Skull: Normal. Negative for fracture or focal lesion. Sinuses/Orbits: The visualized paranasal sinuses are essentially clear. The mastoid air cells are unopacified. Other: None. CT CERVICAL SPINE FINDINGS Alignment: Normal cervical lordosis. Skull base and vertebrae: No acute fracture. No primary bone lesion or focal pathologic process. Soft tissues and spinal canal: No prevertebral fluid or swelling. No visible canal hematoma. Disc levels: Mild degenerative changes of the mid cervical spine. Spinal canal is patent. Upper chest: Visualized lung apices are clear. Other: None. IMPRESSION: No evidence of acute intracranial abnormality. Small vessel ischemic changes. No traumatic injury to the cervical spine. Mild degenerative changes. Electronically Signed   By: Julian Hy M.D.   On: 12/06/2021 03:20    Positive ROS: All other systems have been reviewed and were otherwise negative with the exception of those mentioned in the HPI and as above.  Physical Exam: General: Alert, no acute distress Cardiovascular: No pedal edema Respiratory: No cyanosis, no use of accessory musculature GI: No organomegaly, abdomen is soft and non-tender Skin: No lesions in the area of chief complaint Neurologic: Sensation intact distally Psychiatric: Patient is competent for consent with normal mood and affect Lymphatic: No axillary or cervical lymphadenopathy  MUSCULOSKELETAL: Mild tenderness to palpation about the right hip.  Range of motion of the right hip deferred.  Compartments soft. Good cap refill. Motor and sensory intact distally.  Assessment: 86 year old female admitted after mechanical fall with nondisplaced high superior pubic ramus/anterior acetabular fracture  Plan: Recommend pain control and partial weightbearing (25-50%) right lower extremity with PT.  Follow-up in clinic in 3 to 4 weeks with repeat x-rays of pelvis/right hip.    Renee Harder,  MD    12/06/2021 3:28 PM

## 2021-12-06 NOTE — ED Notes (Signed)
Patient transported to CT 

## 2021-12-06 NOTE — Assessment & Plan Note (Signed)
Renal function close to baseline -Follow-up with BMP 

## 2021-12-06 NOTE — H&P (Addendum)
History and Physical    Bianca Horton [id number] DOB: 01-Sep-1935 DOA: 12/06/2021  Referring MD/NP/PA:   PCP: Perrin Maltese, MD   Patient coming from:  The patient is coming from ALF  Chief Complaint: fall and intractable pain  HPI: Bianca Horton is a 86 y.o. female with medical history significant of hypertension, hyperlipidemia, AAA, hypothyroidism, depression, CKD-3a, tobacco abuse, who presents with fall and intractable pain.  Per report, pt had a witnessed mechanical fall while using walker to ambulate to the bathroom last night. No LOC. She developed pain in her buttock and right pelvis, which is constant, severe, intractable, nonradiating. Patient does not have chest pain, fever or chills.  Patient has mild dry cough.  No nausea, vomiting, diarrhea or abdominal pain.  No symptoms of UTI.  No facial droop or slurred speech. Present has laceration to right forehead.   Patient is not using oxygen normally.  Patient was found to have mild oxygen desaturation to 90% on room air initially, which improved to 96% on room air later on..  Chest x-ray showed possible bronchitis.   Data reviewed independently and ED Course: pt was found to have WBC 10.7, INR 1.1, PTT 27, negative COVID PCR, stable renal function, thrombocytopenia with platelet 118, temperature normal, blood pressure 139/67, heart rate 81, RR 15.  Chest x-ray showed possible bronchitis.  CT of head is negative for acute intracranial abnormalities.  CT of C-spine is negative for acute bony fracture, showed degenerative disc disease.  CT of pelvis that showed right acetabular fracture, right pubic ramus fracture, right sacral ala fracture.  Patient is placed on MedSurg bed for observation, Dr. Sharlet Salina of Ortho is consulted.   EKG: I have personally reviewed.  Sinus rhythm, QTc 447, LAE, low voltage.   Review of Systems:   General: no fevers, chills, no body weight gain, has fatigue HEENT: no blurry vision, hearing changes  or sore throat Respiratory: no dyspnea, has coughing, no wheezing CV: no chest pain, no palpitations GI: no nausea, vomiting, abdominal pain, diarrhea, constipation GU: no dysuria, burning on urination, increased urinary frequency, hematuria  Ext: has trace leg edema Neuro: no unilateral weakness, numbness, or tingling, no vision change or hearing loss. Has fall Skin: no rash. Has small laceration in right forehead. MSK: has pain right pelvis and buttock Heme: No easy bruising.  Travel history: No recent long distant travel.   Allergy:  Allergies  Allergen Reactions   Clopidogrel     Other reaction(s): brusing skin   Sertraline Hcl Nausea And Vomiting    Other reaction(s): Nausea, Vomiting, Diarrhea, sleepwalking, Nausea, Vomiting, Diarrhea, sleepwalking    Past Medical History:  Diagnosis Date   High cholesterol    Hypertension    Thyroid disease     Past Surgical History:  Procedure Laterality Date   GALLBLADDER SURGERY      Social History:  reports that she has been smoking. She has never used smokeless tobacco. She reports that she does not drink alcohol and does not use drugs.  Family History: No family history on file.  I have tried to review family medical history with patient, but the patient cannot provide detailed information.  Prior to Admission medications   Medication Sig Start Date End Date Taking? Authorizing Provider  aspirin 325 MG EC tablet Take 325 mg by mouth daily. Patient not taking: Reported on 10/18/2019    [provider]  buPROPion (WELLBUTRIN XL) 150 MG 24 hr tablet  11/27/15   [provider]  carvedilol (COREG) 12.5 MG tablet 12.5 mg 11/05/18   [provider]  carvedilol (COREG) 25 MG tablet  07/22/13   [provider]  cilostazol (PLETAL) 100 MG tablet  07/22/13   [provider]  cilostazol (PLETAL) 100 MG tablet Take by mouth.    [provider]  cloNIDine (CATAPRES) 0.1 MG tablet  11/27/15    [provider]  cloNIDine (CATAPRES) 0.2 MG tablet  11/05/18   [provider]  cloNIDine (CATAPRES) 0.3 MG tablet  07/22/13   [provider]  furosemide (LASIX) 20 MG tablet  11/27/15   [provider]  levothyroxine (SYNTHROID) 112 MCG tablet Take 112 mcg by mouth daily. Patient not taking: Reported on 11/22/2019 05/07/19   [provider]  levothyroxine (SYNTHROID) 125 MCG tablet Take 125 mcg by mouth every morning. 01/24/21   [provider]  levothyroxine (SYNTHROID) 137 MCG tablet Take 137 mcg by mouth every morning. Patient not taking: Reported on 06/04/2021 11/05/19   [provider]  levothyroxine (SYNTHROID, LEVOTHROID) 100 MCG tablet  07/22/13   [provider]  losartan-hydrochlorothiazide (HYZAAR) 100-25 MG per tablet  07/22/13   [provider]  meloxicam (MOBIC) 7.5 MG tablet Take 7.5 mg by mouth 2 (two) times daily as needed. 09/23/19   [provider]  Multiple Vitamins-Minerals (PRESERVISION/LUTEIN) CAPS Take by mouth. Patient not taking: Reported on 10/18/2019    [provider]  neomycin-polymyxin-hydrocortisone (CORTISPORIN) otic solution 1-2  Drops to the toe after soaking twice daily Patient not taking: Reported on 12/01/2015 08/11/13   Hyatt, Max T, DPM  potassium chloride (KLOR-CON) 10 MEQ tablet Take 1 tablet (10 mEq total) by mouth daily for 5 days. 09/11/19 09/16/19  Menshew, Dannielle Karvonen, PA-C  simvastatin (ZOCOR) 40 MG tablet  07/22/13   [provider]    Physical Exam: Vitals:   12/06/21 1400 12/06/21 1530 12/06/21 1702 12/06/21 1800  BP: 120/61 134/71 (!) 141/79 133/68  Pulse: 67 74  74  Resp: 11 11  14   Temp:    98 F (36.7 C)  TempSrc:      SpO2: 99% 98%  100%   General: Not in acute distress HEENT:       Eyes: PERRL, EOMI, no scleral icterus.       ENT: No discharge from the ears and nose, no pharynx injection, no tonsillar enlargement.        Neck: No  JVD, no bruit, no mass felt. Heme: No neck lymph node enlargement. Cardiac: S1/S2, RRR, No murmurs, No gallops or rubs. Respiratory: No rales, wheezing, rhonchi or rubs. GI: Soft, nondistended, nontender, no rebound pain, no organomegaly, BS present. GU: No hematuria Ext: has trace leg edema bilaterally. 1+DP/PT pulse bilaterally. Musculoskeletal: Has tenderness to the right pelvis Skin: No rashes. Has small laceration in right forehead. Has bruises in both legs. Neuro: Alert, oriented X3, cranial nerves II-XII grossly intact, moves all extremities. Psych: Patient is not psychotic, no suicidal or hemocidal ideation.  Labs on Admission: I have personally reviewed following labs and imaging studies  CBC: Recent Labs  Lab 12/06/21 0543  WBC 10.7*  NEUTROABS 8.2*  HGB 8.9*  HCT 27.7*  MCV 101.8*  PLT 123456*   Basic Metabolic Panel: Recent Labs  Lab 12/06/21 0543  NA 133*  K 4.1  CL 102  CO2 24  GLUCOSE 114*  BUN 42*  CREATININE 1.27*  CALCIUM 9.2   GFR: CrCl cannot be calculated (Unknown ideal  weight.). Liver Function Tests: Recent Labs  Lab 12/06/21 0543  AST 21  ALT 14  ALKPHOS 65  BILITOT 0.6  PROT 5.5*  ALBUMIN 2.6*   No results for input(s): "LIPASE", "AMYLASE" in the last 168 hours. No results for input(s): "AMMONIA" in the last 168 hours. Coagulation Profile: Recent Labs  Lab 12/06/21 0543  INR 1.1   Cardiac Enzymes: No results for input(s): "CKTOTAL", "CKMB", "CKMBINDEX", "TROPONINI" in the last 168 hours. BNP (last 3 results) No results for input(s): "PROBNP" in the last 8760 hours. HbA1C: No results for input(s): "HGBA1C" in the last 72 hours. CBG: No results for input(s): "GLUCAP" in the last 168 hours. Lipid Profile: No results for input(s): "CHOL", "HDL", "LDLCALC", "TRIG", "CHOLHDL", "LDLDIRECT" in the last 72 hours. Thyroid Function Tests: No results for input(s): "TSH", "T4TOTAL", "FREET4", "T3FREE", "THYROIDAB" in the last 72  hours. Anemia Panel: No results for input(s): "VITAMINB12", "FOLATE", "FERRITIN", "TIBC", "IRON", "RETICCTPCT" in the last 72 hours. Urine analysis: No results found for: "COLORURINE", "APPEARANCEUR", "LABSPEC", "PHURINE", "GLUCOSEU", "HGBUR", "BILIRUBINUR", "KETONESUR", "PROTEINUR", "UROBILINOGEN", "NITRITE", "LEUKOCYTESUR" Sepsis Labs: @LABRCNTIP (procalcitonin:4,lacticidven:4) ) Recent Results (from the past 240 hour(s))  SARS Coronavirus 2 by RT PCR (hospital order, performed in [facility] hospital lab) *cepheid single result test* Anterior Nasal Swab     Status: None   Collection Time: 12/06/21  6:07 AM   Specimen: Anterior Nasal Swab  Result Value Ref Range Status   SARS Coronavirus 2 by RT PCR NEGATIVE NEGATIVE Final    Comment: (NOTE) SARS-CoV-2 target nucleic acids are NOT DETECTED.  The SARS-CoV-2 RNA is generally detectable in upper and lower respiratory specimens during the acute phase of infection. The lowest concentration of SARS-CoV-2 viral copies this assay can detect is 250 copies / mL. A negative result does not preclude SARS-CoV-2 infection and should not be used as the sole basis for treatment or other patient management decisions.  A negative result may occur with improper specimen collection / handling, submission of specimen other than nasopharyngeal swab, presence of viral mutation(s) within the areas targeted by this assay, and inadequate number of viral copies (<250 copies / mL). A negative result must be combined with clinical observations, patient history, and epidemiological information.  Fact Sheet for Patients:   https://www.patel.info/  Fact Sheet for Healthcare Providers: https://hall.com/  This test is not yet approved or  cleared by the [place] FDA and has been authorized for detection and/or diagnosis of SARS-CoV-2 by FDA under an Emergency Use Authorization (EUA).  This EUA will remain in effect  (meaning this test can be used) for the duration of the COVID-19 declaration under Section 564(b)(1) of the Act, 21 U.S.C. section 360bbb-3(b)(1), unless the authorization is terminated or revoked sooner.  Performed at [facility], [address] [place], [place] [address]      Radiological Exams on Admission: DG Chest Portable 1 View  Result Date: 12/06/2021 CLINICAL DATA:  86 year old female with history of shortness of breath and hypoxia. EXAM: PORTABLE CHEST 1 VIEW COMPARISON:  Chest x-ray 12/20/2009. FINDINGS: Lung volumes are low. Diffuse interstitial prominence and peribronchial cuffing. No consolidative airspace disease. No pleural effusions. No pneumothorax. No pulmonary nodule or mass noted. Pulmonary vasculature and the cardiomediastinal silhouette are within normal limits. Atherosclerotic calcifications are noted in the thoracic aorta. IMPRESSION: 1. Diffuse interstitial prominence and peribronchial cuffing concerning for an acute bronchitis. 2. Aortic atherosclerosis. Electronically Signed   By: Vinnie Langton M.D.   On: 12/06/2021 05:53   CT PELVIS WO  CONTRAST  Result Date: 12/06/2021 CLINICAL DATA:  Hip trauma. Fracture suspected. Sacral pain following a fall. EXAM: CT PELVIS WITHOUT CONTRAST TECHNIQUE: Multidetector CT imaging of the pelvis was performed following the standard protocol without intravenous contrast. RADIATION DOSE REDUCTION: This exam was performed according to the departmental dose-optimization program which includes automated exposure control, adjustment of the mA and/or kV according to patient size and/or use of iterative reconstruction technique. COMPARISON:  CT without contrast 12/16/2009 FINDINGS: Urinary Tract: The pelvic ureters are decompressed and free of calcifications. The bladder is nondistended but could be thickened. Bowel:  Unremarkable visualized pelvic bowel loops. Vascular/Lymphatic: The distal abdominal aorta, iliac and proximal  femoral arteries are heavily calcified and increasingly calcified since 12 years ago. There previously was a 3 cm AAA, which today measures at least 3.3 cm but it is not fully included on this exam and should be fully imaged under ultrasound to assess its full size. No iliac aneurysm is seen. The degree of calcification likely indicates flow-limiting iliofemoral stenoses bilaterally. There are no enlarged inguinopelvic nodes. Reproductive:  The uterus is absent.  The ovaries are not enlarged. Other: There is a small pelvic hemoperitoneum anterior to the bladder, measuring 5.3 x 2.7 by 4.2 cm. There is trace ascites in the posterior deep pelvis. Small but wide mouth umbilical fat hernia is present. There are tiny inguinal fat hernias. There is no incarcerated hernia. Musculoskeletal: Generalized osteopenia. There are 2 tapered bands of sclerosis extending longitudinally from the top of the femoral heads to the femoral head and neck junctions symmetrically. This is probably preserved bone mineralization surrounded by osteopenia and was present 12 years ago and not significantly different in appearance. Component of osteonecrosis however, is possible, but there has been no interval overlying articular surface subsidence on either side. There is an acute fracture with anterior comminution involving the anterior column of the right acetabulum and a small right hip joint hemarthrosis, with nondisplaced fracture of the right pubic bone and the posterior aspect of the right inferior pubic ramus and with healed fracture deformities of the left anterior acetabulum and inferior pubic ramus. There is an acute transverse fracture through the S4 segment through the ventral body portion as well as the dorsal roof, nondisplaced except for slight cortical buckling of the ventral S4 body. There is additional nondisplaced linear fracture anteriorly of the right ala of the sacrum alongside the SI joint. There is no proximal femoral or  further sacral fracture. There is mild symmetric hip DJD, spurring of the SI joints, lumbar hypertrophic facet arthropathy at L4-5 and L5-S1 and spurring and vacuum phenomenon of the SI joints. IMPRESSION: 1. osteopenia with intra-articular transverse fracture of the anterior column of right acetabulum, with nondisplaced comminution anteriorly and a small hemarthrosis of the right hip joint. 2. Nondisplaced fracture through the right pubic bone and posterior aspect of the right inferior pubic ramus. 3. Nondisplaced fracture through the ventral and dorsal S4 level and intra-articular fracture of the anterior right sacral ala, also nondisplaced. 4. Small hemoperitoneum anterior to the bladder. Trace posterior deep pelvic fluid. 5. Degenerative changes. 6. Cystitis versus bladder nondistention. 7. Heavy vascular calcification with partially visible AAA at least 3.3 cm. Outpatient nonemergent ultrasound recommended to assess the full extent of the aneurysm. Previously this was 3 cm. 8. Shallow but wide mouth umbilical fat hernia. Electronically Signed   By: Telford Nab M.D.   On: 12/06/2021 05:11   DG Sacrum/Coccyx  Result Date: 12/06/2021 CLINICAL DATA:  Fall, tailbone  pain EXAM: SACRUM AND COCCYX - 2+ VIEW COMPARISON:  None Available. FINDINGS: There is no evidence of fracture or other focal bone lesions. No displaced sacrococcygeal fracture. IMPRESSION: Negative. Electronically Signed   By: Charline Bills M.D.   On: 12/06/2021 03:22   DG Pelvis Portable  Result Date: 12/06/2021 CLINICAL DATA:  Fall, tailbone pain EXAM: PORTABLE PELVIS 1-2 VIEWS COMPARISON:  None Available. FINDINGS: No fracture or dislocation is seen. Bilateral hip joint spaces are preserved. Visualized bony pelvis appears intact. Old left superior and inferior pubic rami fractures. Vascular calcifications. IMPRESSION: Negative. Electronically Signed   By: Charline Bills M.D.   On: 12/06/2021 03:21   CT HEAD WO CONTRAST  ( )  Result Date: 12/06/2021 CLINICAL DATA:  Fall, forehead laceration EXAM: CT HEAD WITHOUT CONTRAST CT CERVICAL SPINE WITHOUT CONTRAST TECHNIQUE: Multidetector CT imaging of the head and cervical spine was performed following the standard protocol without intravenous contrast. Multiplanar CT image reconstructions of the cervical spine were also generated. RADIATION DOSE REDUCTION: This exam was performed according to the departmental dose-optimization program which includes automated exposure control, adjustment of the mA and/or kV according to patient size and/or use of iterative reconstruction technique. COMPARISON:  None Available. FINDINGS: CT HEAD FINDINGS Brain: No evidence of acute infarction, hemorrhage, hydrocephalus, extra-axial collection or mass lesion/mass effect. Subcortical white matter and periventricular small vessel ischemic changes. Vascular: No hyperdense vessel or unexpected calcification. Skull: Normal. Negative for fracture or focal lesion. Sinuses/Orbits: The visualized paranasal sinuses are essentially clear. The mastoid air cells are unopacified. Other: None. CT CERVICAL SPINE FINDINGS Alignment: Normal cervical lordosis. Skull base and vertebrae: No acute fracture. No primary bone lesion or focal pathologic process. Soft tissues and spinal canal: No prevertebral fluid or swelling. No visible canal hematoma. Disc levels: Mild degenerative changes of the mid cervical spine. Spinal canal is patent. Upper chest: Visualized lung apices are clear. Other: None. IMPRESSION: No evidence of acute intracranial abnormality. Small vessel ischemic changes. No traumatic injury to the cervical spine. Mild degenerative changes. Electronically Signed   By: Charline Bills M.D.   On: 12/06/2021 03:20   CT Cervical Spine Wo Contrast  Result Date: 12/06/2021 CLINICAL DATA:  Fall, forehead laceration EXAM: CT HEAD WITHOUT CONTRAST CT CERVICAL SPINE WITHOUT CONTRAST TECHNIQUE: Multidetector CT  imaging of the head and cervical spine was performed following the standard protocol without intravenous contrast. Multiplanar CT image reconstructions of the cervical spine were also generated. RADIATION DOSE REDUCTION: This exam was performed according to the departmental dose-optimization program which includes automated exposure control, adjustment of the mA and/or kV according to patient size and/or use of iterative reconstruction technique. COMPARISON:  None Available. FINDINGS: CT HEAD FINDINGS Brain: No evidence of acute infarction, hemorrhage, hydrocephalus, extra-axial collection or mass lesion/mass effect. Subcortical white matter and periventricular small vessel ischemic changes. Vascular: No hyperdense vessel or unexpected calcification. Skull: Normal. Negative for fracture or focal lesion. Sinuses/Orbits: The visualized paranasal sinuses are essentially clear. The mastoid air cells are unopacified. Other: None. CT CERVICAL SPINE FINDINGS Alignment: Normal cervical lordosis. Skull base and vertebrae: No acute fracture. No primary bone lesion or focal pathologic process. Soft tissues and spinal canal: No prevertebral fluid or swelling. No visible canal hematoma. Disc levels: Mild degenerative changes of the mid cervical spine. Spinal canal is patent. Upper chest: Visualized lung apices are clear. Other: None. IMPRESSION: No evidence of acute intracranial abnormality. Small vessel ischemic changes. No traumatic injury to the cervical spine. Mild degenerative changes. Electronically Signed  By: Julian Hy M.D.   On: 12/06/2021 03:20      Assessment/Plan Principal Problem:   Intractable pain Active Problems:   Closed nondisplaced fracture of right acetabulum, unspecified portion of acetabulum, initial encounter ([place])   Inferior pubic ramus fracture-right   Essential hypertension   Chronic kidney disease, stage 3a ([place])   Fall at home, initial encounter   Hyperlipidemia    Hypothyroidism   Bronchitis   Tobacco abuse   Thrombocytopenia ([place])   AAA (abdominal aortic aneurysm) without rupture (HCC)   Assessment and Plan: * Intractable pain Intractable pain due to right acetabular fracture, inferior right pubic ramus fracture and right sacral ala fracture.  Consulted with Dr. Sharlet Salina of Ortho.  Patient does not need surgery. He recommend pain control and partial weightbearing (25-50%) right lower extremity with PT.  Follow-up in clinic in 3 to 4 weeks with repeat x-rays of pelvis/right hip.  -will place in med-surg bed for obs -As needed Percocet, morphine, Tylenol -Lidoderm -As needed Robaxin  Closed nondisplaced fracture of right acetabulum, unspecified portion of acetabulum, initial encounter ([place]) - See above  Inferior pubic ramus fracture-right - See above  Essential hypertension - IV hydralazine as needed -Coreg, enalapril  Chronic kidney disease, stage 3a ([place]) Renal function close to baseline -Follow-up with BMP  Fall at home, initial encounter CT head negative.  CT of C-spine negative for acute bony fracture, but showed degenerative disc disease. - Fall precaution -PT  Hyperlipidemia - Zocor  Hypothyroidism - Synthroid  Bronchitis Possible bronchitis: -Started Z-Pak empirically -As needed albuterol and Mucinex  Tobacco abuse - Nicotine patch  Thrombocytopenia (HCC) Platelet 118.  Patient had platelet of 250 on 02/01/2019 -Check LDH and peripheral smear  AAA (abdominal aortic aneurysm) without rupture ([place]) - Follow-up with PCP          DVT ppx: SCD  Code Status: Full code  Family Communication: I have tried to call her niece without success, could not leave a message since mailbox is not set up.  Disposition Plan:  Anticipate discharge back to previous environment  Consults called:  Dr .Sharlet Salina of ortho  Admission status and Level of care: Telemetry Medical:   for obs     Dispo: The patient is from:  ALF              Anticipated d/c is to: ALF              Anticipated d/c date is: 1 day              Patient currently is not medically stable to d/c.    Severity of Illness:  The appropriate patient status for this patient is OBSERVATION. Observation status is judged to be reasonable and necessary in order to provide the required intensity of service to ensure the patient's safety. The patient's presenting symptoms, physical exam findings, and initial radiographic and laboratory data in the context of their medical condition is felt to place them at decreased risk for further clinical deterioration. Furthermore, it is anticipated that the patient will be medically stable for discharge from the hospital within 2 midnights of admission.        Date of Service 12/06/2021    [facility]   If 7PM-7AM, please contact night-coverage [email or web address] 12/06/2021, 7:25 PM

## 2021-12-06 NOTE — Assessment & Plan Note (Signed)
Follow up with PCP

## 2021-12-06 NOTE — Assessment & Plan Note (Signed)
Possible bronchitis: -Started Z-Pak empirically -As needed albuterol and Mucinex

## 2021-12-07 DIAGNOSIS — J40 Bronchitis, not specified as acute or chronic: Secondary | ICD-10-CM | POA: Diagnosis not present

## 2021-12-07 DIAGNOSIS — S32401A Unspecified fracture of right acetabulum, initial encounter for closed fracture: Secondary | ICD-10-CM | POA: Diagnosis present

## 2021-12-07 DIAGNOSIS — S32591A Other specified fracture of right pubis, initial encounter for closed fracture: Secondary | ICD-10-CM | POA: Diagnosis not present

## 2021-12-07 LAB — CBC
HCT: 25.6 % — ABNORMAL LOW (ref 36.0–46.0)
Hemoglobin: 8.3 g/dL — ABNORMAL LOW (ref 12.0–15.0)
MCH: 32.9 pg (ref 26.0–34.0)
MCHC: 32.4 g/dL (ref 30.0–36.0)
MCV: 101.6 fL — ABNORMAL HIGH (ref 80.0–100.0)
Platelets: 101 10*3/uL — ABNORMAL LOW (ref 150–400)
RBC: 2.52 MIL/uL — ABNORMAL LOW (ref 3.87–5.11)
RDW: 14.5 % (ref 11.5–15.5)
WBC: 7.4 10*3/uL (ref 4.0–10.5)
nRBC: 0 % (ref 0.0–0.2)

## 2021-12-07 LAB — BASIC METABOLIC PANEL
Anion gap: 4 — ABNORMAL LOW (ref 5–15)
BUN: 46 mg/dL — ABNORMAL HIGH (ref 8–23)
CO2: 26 mmol/L (ref 22–32)
Calcium: 9.3 mg/dL (ref 8.9–10.3)
Chloride: 105 mmol/L (ref 98–111)
Creatinine, Ser: 1.31 mg/dL — ABNORMAL HIGH (ref 0.44–1.00)
GFR, Estimated: 40 mL/min — ABNORMAL LOW (ref >60)
Glucose, Bld: 80 mg/dL (ref 70–99)
Potassium: 4.6 mmol/L (ref 3.5–5.1)
Sodium: 135 mmol/L (ref 135–145)

## 2021-12-07 MED ORDER — ENOXAPARIN SODIUM 30 MG/0.3ML IJ SOSY
30.0000 mg | PREFILLED_SYRINGE | INTRAMUSCULAR | Status: DC
  Administered 2021-12-07 – 2021-12-10 (×4): 30 mg via SUBCUTANEOUS
  Filled 2021-12-07 (×4): qty 0.3

## 2021-12-07 NOTE — Progress Notes (Signed)
PT went in to see pt this morning and reported to nures that sats remained at 90 and/or above on RA with therapy so O2 was not replaced. Nurse checked O2 while giving morning med and sats were at 87%, pt placed back on O2 at 1 LPM with sats rising to 92%. [place] in place at 1LPM O2.

## 2021-12-07 NOTE — Progress Notes (Signed)
PROGRESS NOTE    Bianca Horton  [id number] DOB: 01-02-1936 DOA: 12/06/2021 PCP: Perrin Maltese, MD  Assessment & Plan:   Principal Problem:   Intractable pain Active Problems:   Closed nondisplaced fracture of right acetabulum, unspecified portion of acetabulum, initial encounter ([place])   Inferior pubic ramus fracture-right   Essential hypertension   Chronic kidney disease, stage 3a ([place])   Fall at home, initial encounter   Hyperlipidemia   Hypothyroidism   Bronchitis   Tobacco abuse   Thrombocytopenia ([place])   AAA (abdominal aortic aneurysm) without rupture ([facility])  Assessment and Plan:   Closed nondisplaced fracture of right acetabulum: percocet, morphine prn. Partial weightbearing of RLE as per ortho surg   Inferior right pubic ramus fracture & right sacral ala fracture: percocet, morphine prn. Partial weightbearing of RLE as per ortho surg. F/u outpatient w/ ortho surg, Dr. Sharlet [place], in 3-4 weeks    HTN: continue on coreg, enalapril. IV hydralazine prn    CKDIIIa: Cr is labile    Fall: at home. CT head negative.  CT of C-spine negative for acute bony fracture, but showed degenerative disc disease. PT/OT consulted    HLD: continue on statin   Hypothyroidism: continue on synthroid   Possible bronchitis: continue on azithromycin, bronchodilators & encourage incentive spirometry    Tobacco abuse: smoking cessation counseling x 5 mins. Nicotine patch to prevent w/drawl    Thrombocytopenia: etiology unclear. Will continue to monitor. F/u on peripheral smear   AAA: will need f/u w/ PCP         DVT prophylaxis: lovenox Code Status: full  Family Communication: discussed pt's care w/ pt's niece, Vivien Rota, and answered her questions Disposition Plan: possibly d/c to SNF  Level of care: Telemetry Medical  Status is: Inpatient Remains inpatient appropriate because: severity of illness    Consultants:  Ortho surg   Procedures:   Antimicrobials: azithromycin    Subjective: Pt c/o pelvic pain   Objective: Vitals:   12/06/21 2037 12/06/21 2300 12/07/21 0333 12/07/21 0453  BP: (!) 108/49  128/62   Pulse: 74  86   Resp: 20  18   Temp: 98.5 F (36.9 C)  99.2 F (37.3 C)   TempSrc: Oral  Oral   SpO2: 96%  96%   Weight:  56.2 kg  56.2 kg  Height:  5\' 3"  (1.6 m)      Intake/Output Summary (Last 24 hours) at 12/07/2021 0753 Last data filed at 12/07/2021 0300 Gross per 24 hour  Intake --  Output 500 ml  Net -500 ml   Filed Weights   12/06/21 2300 12/07/21 0453  Weight: 56.2 kg 56.2 kg    Examination:  General exam: Appears calm and comfortable  Respiratory system: Clear to auscultation. Respiratory effort normal. Cardiovascular system: S1 & S2+.  No rubs, gallops or clicks.  Gastrointestinal system: Abdomen is nondistended, soft and nontender.  Normal bowel sounds heard. Central nervous system: Alert and awake. Moves all extremities  Psychiatry: Judgement and insight appears at baseline. Flat mood and affect     Data Reviewed: I have personally reviewed following labs and imaging studies  CBC: Recent Labs  Lab 12/06/21 0543 12/07/21 0452  WBC 10.7* 7.4  NEUTROABS 8.2*  --   HGB 8.9* 8.3*  HCT 27.7* 25.6*  MCV 101.8* 101.6*  PLT 118* [id number]*   Basic Metabolic Panel: Recent Labs  Lab 12/06/21 0543 12/07/21 0452  NA 133* 135  K 4.1 4.6  CL 102 105  CO2  24 26  GLUCOSE 114* 80  BUN 42* 46*  CREATININE 1.27* 1.31*  CALCIUM 9.2 9.3   GFR: Estimated Creatinine Clearance: 26.9 mL/min (A) (by C-G formula based on SCr of 1.31 mg/dL (H)). Liver Function Tests: Recent Labs  Lab 12/06/21 0543  AST 21  ALT 14  ALKPHOS 65  BILITOT 0.6  PROT 5.5*  ALBUMIN 2.6*   No results for input(s): "LIPASE", "AMYLASE" in the last 168 hours. No results for input(s): "AMMONIA" in the last 168 hours. Coagulation Profile: Recent Labs  Lab 12/06/21 0543  INR 1.1   Cardiac Enzymes: No results for input(s): "CKTOTAL", "CKMB",  "CKMBINDEX", "TROPONINI" in the last 168 hours. BNP (last 3 results) No results for input(s): "PROBNP" in the last 8760 hours. HbA1C: No results for input(s): "HGBA1C" in the last 72 hours. CBG: No results for input(s): "GLUCAP" in the last 168 hours. Lipid Profile: No results for input(s): "CHOL", "HDL", "LDLCALC", "TRIG", "CHOLHDL", "LDLDIRECT" in the last 72 hours. Thyroid Function Tests: No results for input(s): "TSH", "T4TOTAL", "FREET4", "T3FREE", "THYROIDAB" in the last 72 hours. Anemia Panel: No results for input(s): "VITAMINB12", "FOLATE", "FERRITIN", "TIBC", "IRON", "RETICCTPCT" in the last 72 hours. Sepsis Labs: No results for input(s): "PROCALCITON", "LATICACIDVEN" in the last 168 hours.  Recent Results (from the past 240 hour(s))  SARS Coronavirus 2 by RT PCR (hospital order, performed in [facility] hospital lab) *cepheid single result test* Anterior Nasal Swab     Status: None   Collection Time: 12/06/21  6:07 AM   Specimen: Anterior Nasal Swab  Result Value Ref Range Status   SARS Coronavirus 2 by RT PCR NEGATIVE NEGATIVE Final    Comment: (NOTE) SARS-CoV-2 target nucleic acids are NOT DETECTED.  The SARS-CoV-2 RNA is generally detectable in upper and lower respiratory specimens during the acute phase of infection. The lowest concentration of SARS-CoV-2 viral copies this assay can detect is 250 copies / mL. A negative result does not preclude SARS-CoV-2 infection and should not be used as the sole basis for treatment or other patient management decisions.  A negative result may occur with improper specimen collection / handling, submission of specimen other than nasopharyngeal swab, presence of viral mutation(s) within the areas targeted by this assay, and inadequate number of viral copies (<250 copies / mL). A negative result must be combined with clinical observations, patient history, and epidemiological information.  Fact Sheet for Patients:    https://www.patel.info/  Fact Sheet for Healthcare Providers: https://hall.com/  This test is not yet approved or  cleared by the [place] FDA and has been authorized for detection and/or diagnosis of SARS-CoV-2 by FDA under an Emergency Use Authorization (EUA).  This EUA will remain in effect (meaning this test can be used) for the duration of the COVID-19 declaration under Section 564(b)(1) of the Act, 21 U.S.C. section 360bbb-3(b)(1), unless the authorization is terminated or revoked sooner.  Performed at [facility], [address], [place], [place] [address]          Radiology Studies: DG Chest Portable 1 View  Result Date: 12/06/2021 CLINICAL DATA:  86 year old female with history of shortness of breath and hypoxia. EXAM: PORTABLE CHEST 1 VIEW COMPARISON:  Chest x-ray 12/20/2009. FINDINGS: Lung volumes are low. Diffuse interstitial prominence and peribronchial cuffing. No consolidative airspace disease. No pleural effusions. No pneumothorax. No pulmonary nodule or mass noted. Pulmonary vasculature and the cardiomediastinal silhouette are within normal limits. Atherosclerotic calcifications are noted in the thoracic aorta. IMPRESSION: 1. Diffuse interstitial prominence  and peribronchial cuffing concerning for an acute bronchitis. 2. Aortic atherosclerosis. Electronically Signed   By: Vinnie Langton M.D.   On: 12/06/2021 05:53   CT PELVIS WO CONTRAST  Result Date: 12/06/2021 CLINICAL DATA:  Hip trauma. Fracture suspected. Sacral pain following a fall. EXAM: CT PELVIS WITHOUT CONTRAST TECHNIQUE: Multidetector CT imaging of the pelvis was performed following the standard protocol without intravenous contrast. RADIATION DOSE REDUCTION: This exam was performed according to the departmental dose-optimization program which includes automated exposure control, adjustment of the mA and/or kV according to patient size and/or use of  iterative reconstruction technique. COMPARISON:  CT without contrast 12/16/2009 FINDINGS: Urinary Tract: The pelvic ureters are decompressed and free of calcifications. The bladder is nondistended but could be thickened. Bowel:  Unremarkable visualized pelvic bowel loops. Vascular/Lymphatic: The distal abdominal aorta, iliac and proximal femoral arteries are heavily calcified and increasingly calcified since 12 years ago. There previously was a 3 cm AAA, which today measures at least 3.3 cm but it is not fully included on this exam and should be fully imaged under ultrasound to assess its full size. No iliac aneurysm is seen. The degree of calcification likely indicates flow-limiting iliofemoral stenoses bilaterally. There are no enlarged inguinopelvic nodes. Reproductive:  The uterus is absent.  The ovaries are not enlarged. Other: There is a small pelvic hemoperitoneum anterior to the bladder, measuring 5.3 x 2.7 by 4.2 cm. There is trace ascites in the posterior deep pelvis. Small but wide mouth umbilical fat hernia is present. There are tiny inguinal fat hernias. There is no incarcerated hernia. Musculoskeletal: Generalized osteopenia. There are 2 tapered bands of sclerosis extending longitudinally from the top of the femoral heads to the femoral head and neck junctions symmetrically. This is probably preserved bone mineralization surrounded by osteopenia and was present 12 years ago and not significantly different in appearance. Component of osteonecrosis however, is possible, but there has been no interval overlying articular surface subsidence on either side. There is an acute fracture with anterior comminution involving the anterior column of the right acetabulum and a small right hip joint hemarthrosis, with nondisplaced fracture of the right pubic bone and the posterior aspect of the right inferior pubic ramus and with healed fracture deformities of the left anterior acetabulum and inferior pubic ramus.  There is an acute transverse fracture through the S4 segment through the ventral body portion as well as the dorsal roof, nondisplaced except for slight cortical buckling of the ventral S4 body. There is additional nondisplaced linear fracture anteriorly of the right ala of the sacrum alongside the SI joint. There is no proximal femoral or further sacral fracture. There is mild symmetric hip DJD, spurring of the SI joints, lumbar hypertrophic facet arthropathy at L4-5 and L5-S1 and spurring and vacuum phenomenon of the SI joints. IMPRESSION: 1. osteopenia with intra-articular transverse fracture of the anterior column of right acetabulum, with nondisplaced comminution anteriorly and a small hemarthrosis of the right hip joint. 2. Nondisplaced fracture through the right pubic bone and posterior aspect of the right inferior pubic ramus. 3. Nondisplaced fracture through the ventral and dorsal S4 level and intra-articular fracture of the anterior right sacral ala, also nondisplaced. 4. Small hemoperitoneum anterior to the bladder. Trace posterior deep pelvic fluid. 5. Degenerative changes. 6. Cystitis versus bladder nondistention. 7. Heavy vascular calcification with partially visible AAA at least 3.3 cm. Outpatient nonemergent ultrasound recommended to assess the full extent of the aneurysm. Previously this was 3 cm. 8. Shallow but wide mouth  umbilical fat hernia. Electronically Signed   By: Almira Bar M.D.   On: 12/06/2021 05:11   DG Sacrum/Coccyx  Result Date: 12/06/2021 CLINICAL DATA:  Fall, tailbone pain EXAM: SACRUM AND COCCYX - 2+ VIEW COMPARISON:  None Available. FINDINGS: There is no evidence of fracture or other focal bone lesions. No displaced sacrococcygeal fracture. IMPRESSION: Negative. Electronically Signed   By: Charline Bills M.D.   On: 12/06/2021 03:22   DG Pelvis Portable  Result Date: 12/06/2021 CLINICAL DATA:  Fall, tailbone pain EXAM: PORTABLE PELVIS 1-2 VIEWS COMPARISON:  None  Available. FINDINGS: No fracture or dislocation is seen. Bilateral hip joint spaces are preserved. Visualized bony pelvis appears intact. Old left superior and inferior pubic rami fractures. Vascular calcifications. IMPRESSION: Negative. Electronically Signed   By: Charline Bills M.D.   On: 12/06/2021 03:21   CT HEAD WO CONTRAST ( )  Result Date: 12/06/2021 CLINICAL DATA:  Fall, forehead laceration EXAM: CT HEAD WITHOUT CONTRAST CT CERVICAL SPINE WITHOUT CONTRAST TECHNIQUE: Multidetector CT imaging of the head and cervical spine was performed following the standard protocol without intravenous contrast. Multiplanar CT image reconstructions of the cervical spine were also generated. RADIATION DOSE REDUCTION: This exam was performed according to the departmental dose-optimization program which includes automated exposure control, adjustment of the mA and/or kV according to patient size and/or use of iterative reconstruction technique. COMPARISON:  None Available. FINDINGS: CT HEAD FINDINGS Brain: No evidence of acute infarction, hemorrhage, hydrocephalus, extra-axial collection or mass lesion/mass effect. Subcortical white matter and periventricular small vessel ischemic changes. Vascular: No hyperdense vessel or unexpected calcification. Skull: Normal. Negative for fracture or focal lesion. Sinuses/Orbits: The visualized paranasal sinuses are essentially clear. The mastoid air cells are unopacified. Other: None. CT CERVICAL SPINE FINDINGS Alignment: Normal cervical lordosis. Skull base and vertebrae: No acute fracture. No primary bone lesion or focal pathologic process. Soft tissues and spinal canal: No prevertebral fluid or swelling. No visible canal hematoma. Disc levels: Mild degenerative changes of the mid cervical spine. Spinal canal is patent. Upper chest: Visualized lung apices are clear. Other: None. IMPRESSION: No evidence of acute intracranial abnormality. Small vessel ischemic changes. No  traumatic injury to the cervical spine. Mild degenerative changes. Electronically Signed   By: Charline Bills M.D.   On: 12/06/2021 03:20   CT Cervical Spine Wo Contrast  Result Date: 12/06/2021 CLINICAL DATA:  Fall, forehead laceration EXAM: CT HEAD WITHOUT CONTRAST CT CERVICAL SPINE WITHOUT CONTRAST TECHNIQUE: Multidetector CT imaging of the head and cervical spine was performed following the standard protocol without intravenous contrast. Multiplanar CT image reconstructions of the cervical spine were also generated. RADIATION DOSE REDUCTION: This exam was performed according to the departmental dose-optimization program which includes automated exposure control, adjustment of the mA and/or kV according to patient size and/or use of iterative reconstruction technique. COMPARISON:  None Available. FINDINGS: CT HEAD FINDINGS Brain: No evidence of acute infarction, hemorrhage, hydrocephalus, extra-axial collection or mass lesion/mass effect. Subcortical white matter and periventricular small vessel ischemic changes. Vascular: No hyperdense vessel or unexpected calcification. Skull: Normal. Negative for fracture or focal lesion. Sinuses/Orbits: The visualized paranasal sinuses are essentially clear. The mastoid air cells are unopacified. Other: None. CT CERVICAL SPINE FINDINGS Alignment: Normal cervical lordosis. Skull base and vertebrae: No acute fracture. No primary bone lesion or focal pathologic process. Soft tissues and spinal canal: No prevertebral fluid or swelling. No visible canal hematoma. Disc levels: Mild degenerative changes of the mid cervical spine. Spinal canal is patent. Upper chest: Visualized lung  apices are clear. Other: None. IMPRESSION: No evidence of acute intracranial abnormality. Small vessel ischemic changes. No traumatic injury to the cervical spine. Mild degenerative changes. Electronically Signed   By: Julian Hy M.D.   On: 12/06/2021 03:20        Scheduled Meds:   azithromycin  250 mg Oral Daily   buPROPion  150 mg Oral Daily   carvedilol  25 mg Oral BID WC   cilostazol  100 mg Oral BID   enalapril  2.5 mg Oral Daily   folic acid  1 mg Oral Daily   levothyroxine  125 mcg Oral q morning   lidocaine  1 patch Transdermal Q24H   nicotine  21 mg Transdermal Daily   simvastatin  40 mg Oral q1800   SM Double Antibiotic  1 Application Topical BID   Continuous Infusions:   LOS: 1 day    Time spent: 35 mins     Wyvonnia Dusky, MD [facility] Pager [phone]  If 7PM-7AM, please contact night-coverage [email or web address] 12/07/2021, 7:53 AM '

## 2021-12-07 NOTE — Evaluation (Signed)
Occupational Therapy Evaluation Patient Details Name: Bianca Horton MRN: [id number] DOB: 08/11/1935 Today's Date: 12/07/2021   History of Present Illness Patient is an 86 year old female who presents after mechanical fall while using walker to ambulate to bathroom at night. Patient found to have R acetabular fx, R pubic ramus fracture, R sacral ala fracture. Per Dr. Sharlet Salina patient is PWB RLE. PMh includes HTN, HLD, AAA, hypothyroidism, depression, CKD stage III, tobacco abuse.   Clinical Impression   Pt was seen for OT evaluation this date. Prior to hospital admission, pt was living at ALF, ambulating with RW, and requiring assist for bathing and IADL per pt's report. No family/caregivers present to verify. Pt HOH requiring VC during session for following commands. Pt presents to acute OT demonstrating impaired ADL performance and functional mobility 2/2 R hip/pelvic pain, decreased strength, balance, activity tolerance, and decreased knowledge of [place] (See OT problem list). Pt currently requires MOD-MAX A for bed mobility, MOD A to stand from elevated EOB with VC for RW mgt and hand placement, and MAX A for lateral scoots EOB. Pt requires MAX A for LB ADL. Pt unable tolerate standing for more than ~30sec before requesting to return to sitting 2/2 pain. Pt would benefit from skilled OT services to address noted impairments and functional limitations (see below for any additional details) in order to maximize safety and independence while minimizing falls risk and caregiver burden. Upon hospital discharge, recommend STR to maximize pt safety and return to PLOF.    Recommendations for follow up therapy are one component of a multi-disciplinary discharge planning process, led by the attending physician.  Recommendations may be updated based on patient status, additional functional criteria and insurance authorization.   Follow Up Recommendations  Skilled nursing-short term rehab (<3 hours/day)     Assistance Recommended at Discharge Frequent or constant Supervision/Assistance  Patient can return home with the following A lot of help with walking and/or transfers;A lot of help with bathing/dressing/bathroom;Assistance with cooking/housework;Assist for transportation;Help with stairs or ramp for entrance;Direct supervision/assist for medications management    Functional Status Assessment  Patient has had a recent decline in their functional status and demonstrates the ability to make significant improvements in function in a reasonable and predictable amount of time.  Equipment Recommendations  Other (comment) (defer to next venue)    Recommendations for Other Services       Precautions / Restrictions Precautions Precautions: Fall Restrictions RLE Weight Bearing: Partial weight bearing RLE Partial Weight Bearing Percentage or Pounds: 25-50%      Mobility Bed Mobility Overal bed mobility: Needs Assistance Bed Mobility: Supine to Sit, Sit to Supine     Supine to sit: Max assist, HOB elevated Sit to supine: Mod assist        Transfers Overall transfer level: Needs assistance Equipment used: Rolling walker (2 wheels) Transfers: Sit to/from Stand, Bed to chair/wheelchair/BSC Sit to Stand: Mod assist, From elevated surface          Lateral/Scoot Transfers: Mod assist, Max assist General transfer comment: VC for PWBing, VC for hand placement/RW mgt      Balance Overall balance assessment: History of Falls, Needs assistance Sitting-balance support: Single extremity supported, Bilateral upper extremity supported Sitting balance-Leahy Scale: Fair     Standing balance support: Bilateral upper extremity supported, Reliant on assistive device for balance Standing balance-Leahy Scale: Fair Standing balance comment: pain limited  ADL either performed or assessed with clinical judgement   ADL Overall ADL's : Needs  assistance/impaired                                       General ADL Comments: Pt currently requires MAX A for LB ADL tasks, MOD A for ADL transfers with RW, unable to tolerate pivot transfers 2/2 pain. SBA-CGA in sitting for seated ADL.     Vision         Perception     Praxis      Pertinent Vitals/Pain Pain Assessment Pain Assessment: 0-10 Pain Score: 5  Pain Location: R hip with standing Pain Descriptors / Indicators: Sharp Pain Intervention(s): Limited activity within patient's tolerance, Monitored during session, Premedicated before session, Repositioned     Hand Dominance     Extremity/Trunk Assessment Upper Extremity Assessment Upper Extremity Assessment: Generalized weakness   Lower Extremity Assessment Lower Extremity Assessment: Generalized weakness;RLE deficits/detail RLE: Unable to fully assess due to pain RLE Sensation: decreased light touch RLE Coordination: decreased gross motor       Communication Communication Communication: HOH   Cognition Arousal/Alertness: Awake/alert Behavior During Therapy: WFL for tasks assessed/performed Overall Cognitive Status: Difficult to assess                                 General Comments: HOH, questionable historian, requires VC for simple commands     General Comments       Exercises Other Exercises Other Exercises: Pt educated in role of acute OT, PWBing, falls prevention, RW mgt and sequencing for ADL transfers.   Shoulder Instructions      Home Living Family/patient expects to be discharged to:: Skilled nursing facility                                 Additional Comments: Patient unable to remember name of facility but remembers she came from a facility.      Prior Functioning/Environment Prior Level of Function : Needs assist;Patient poor historian/Family not available             Mobility Comments: Per patient she walks with a walker for short  distances. ADLs Comments: Per patient she was at a different facility and completely independent but upon transferring to her new facility she "fell apart" and needs assistance. Pt reports indep with dressing but requires assist from staff for bathing, medications, meals, and transportation        OT Problem List: Pain;Decreased strength;Decreased activity tolerance;Impaired balance (sitting and/or standing);Decreased knowledge of use of DME or AE;Decreased knowledge of precautions      OT Treatment/Interventions: Self-care/ADL training;Therapeutic exercise;Therapeutic activities;DME and/or AE instruction;Patient/family education;Balance training    OT Goals(Current goals can be found in the care plan section) Acute Rehab OT Goals Patient Stated Goal: have less pain and get better OT Goal Formulation: With patient Time For Goal Achievement: 12/21/21 Potential to Achieve Goals: Good ADL Goals Pt Will Perform Lower Body Dressing: with mod assist;sit to/from stand Pt Will Transfer to Toilet: with min assist;stand pivot transfer;bedside commode (LRAD, maintaining WB precautions) Pt Will Perform Toileting - Clothing Manipulation and hygiene: sitting/lateral leans;with supervision;with set-up Additional ADL Goal #1: Pt will verbalize plan to implement at least 2 learned falls prevention strategies. Additional ADL Goal #  2: Pt will complete seated grooming tasks with setup and supervision EOB.  OT Frequency: Min 2X/week    Co-evaluation              AM-PAC OT "6 Clicks" Daily Activity     Outcome Measure Help from another person eating meals?: None Help from another person taking care of personal grooming?: A Little Help from another person toileting, which includes using toliet, bedpan, or urinal?: A Lot Help from another person bathing (including washing, rinsing, drying)?: A Lot Help from another person to put on and taking off regular upper body clothing?: A Lot Help from another  person to put on and taking off regular lower body clothing?: A Lot 6 Click Score: 15   End of Session Equipment Utilized During Treatment: Gait belt;Rolling walker (2 wheels)  Activity Tolerance: Patient limited by pain Patient left: in bed;with call bell/phone within reach;with bed alarm set  OT Visit Diagnosis: Other abnormalities of gait and mobility (R26.89);Muscle weakness (generalized) (M62.81);History of falling (Z91.81);Pain Pain - Right/Left: Right Pain - part of body: Hip (and pelvis)                Time: [id number] OT Time Calculation (min): 18 min Charges:  OT General Charges $OT Visit: 1 Visit OT Evaluation $OT Eval Moderate Complexity: 1 Mod OT Treatments $Self Care/Home Management : 8-22 mins  Ardeth Perfect., MPH, MS, OTR/L ascom [phone] 12/07/21, 3:17 PM

## 2021-12-07 NOTE — Plan of Care (Signed)

## 2021-12-07 NOTE — Care Management CC44 (Signed)
Condition Code 44 Documentation Completed  Patient Details  Name: Bianca Horton MRN: [id number] Date of Birth: [birth year]   Condition Code 44 given:  Yes Patient signature on Condition Code 44 notice:  Yes Documentation of 2 MD's agreement:  Yes Code 44 added to claim:  Yes    Candie Chroman, LCSW 12/07/2021, 8:52 AM

## 2021-12-07 NOTE — TOC Initial Note (Addendum)
Transition of Care [facility]) - Initial/Assessment Note    Patient Details  Name: Bianca Horton MRN: [id number] Date of Birth: 06/09/1935  [facility] CM/SW Contact:    Colen Darling, [place] Phone Number: 12/07/2021, 3:10 PM  Clinical Narrative:                  SW met with the patient at bedside to go over discharge options for SNF. She is currently a resident at [facility]. SW to contact ALF to inform them that the patient will need SNF rehab prior to return. Patient is [facility] but has hearing aides and niece Floydene Flock [phone] is assisting the patient.  The patient is interested in referrals to [place], [place]. Referrals sent. Patient Fl2 and PASSR completed.  1545: SW spoke to Floydene Flock [phone] and reviewed the discharge plans with her. She is the patient's HCPOA. Floydene Flock explained that [facility] would be a preferred SNF. SW will need to start insurance authorization through [facility]. Patient needs authorization from insurance and medical clearance from the provider.   Expected Discharge Plan: Skilled Nursing Facility Barriers to Discharge: Continued Medical Work up, Ship broker, SNF Pending bed offer   Patient Goals and CMS Choice Patient states their goals for this hospitalization and ongoing recovery are:: To get stronger CMS Medicare.gov Compare Post Acute Care list provided to:: Patient    Expected Discharge Plan and Services Expected Discharge Plan: [place] Choice: [place] arrangements for the past 2 months: [place]                                      Prior Living Arrangements/Services Living arrangements for the past 2 months: [place] Lives with:: Facility Resident Patient language and need for interpreter reviewed:: Yes Do you feel safe going back to the place where you live?: Yes      Need for Family  Participation in Patient Care: Yes (Comment) Care giver support system in place?: Yes (comment)   Criminal Activity/Legal Involvement Pertinent to Current Situation/Hospitalization: No - Comment as needed  Activities of Daily Living Home Assistive Devices/Equipment: Gilford Rile (specify type) ADL Screening (condition at time of admission) Patient's cognitive ability adequate to safely complete daily activities?: Yes Is the patient deaf or have difficulty hearing?: Yes Does the patient have difficulty seeing, even when wearing glasses/contacts?: No Does the patient have difficulty concentrating, remembering, or making decisions?: No Patient able to express need for assistance with ADLs?: Yes Does the patient have difficulty dressing or bathing?: Yes Independently performs ADLs?: No Communication: Independent Dressing (OT): Needs assistance Is this a change from baseline?: Change from baseline, expected to last <3days Grooming: Independent Feeding: Independent Bathing: Needs assistance Is this a change from baseline?: Change from baseline, expected to last <3 days Toileting: Needs assistance Is this a change from baseline?: Change from baseline, expected to last <3 days In/Out Bed: Needs assistance Is this a change from baseline?: Change from baseline, expected to last <3 days Walks in Home: Needs assistance Is this a change from baseline?: Change from baseline, expected to last <3 days Does the patient have difficulty walking or climbing stairs?: Yes Weakness of Legs: Both Weakness of Arms/Hands: None  Permission Sought/Granted Permission sought to share information with : Facility Sport and exercise psychologist, Family Supports Permission granted to share information with :  Yes, Verbal Permission Granted  Share Information with NAME: Floydene Flock  Permission granted to share info w AGENCY: SNF, [facility]  Permission granted to share info w Relationship: niece  Permission granted to share  info w Contact Information: [phone]  Emotional Assessment Appearance:: Appears stated age Attitude/Demeanor/Rapport: Engaged, Gracious Affect (typically observed): Pleasant Orientation: : Oriented to  Time, Oriented to Place, Oriented to Self, Oriented to Situation Alcohol / Substance Use: Not Applicable Psych Involvement: No (comment)  Admission diagnosis:  Intractable pain [R52] Closed nondisplaced fracture of right acetabulum, unspecified portion of acetabulum, initial encounter ([place]) [S32.401A] Closed nondisplaced fracture of right acetabulum ([place]) [S32.401A] Patient Active Problem List   Diagnosis Date Noted   Closed nondisplaced fracture of right acetabulum ([place]) 12/07/2021   Intractable pain 12/06/2021   Closed nondisplaced fracture of right acetabulum, unspecified portion of acetabulum, initial encounter ([place]) 12/06/2021   Hypothyroidism 12/06/2021   Inferior pubic ramus fracture-right 12/06/2021   Fall at home, initial encounter 12/06/2021   Chronic kidney disease, stage 3a ([place]) 12/06/2021   Tobacco abuse 12/06/2021   Bronchitis 12/06/2021   Thrombocytopenia ([place]) 12/06/2021   Acute bronchitis 12/06/2021   Essential hypertension 10/19/2019   Hyperlipidemia 10/19/2019   DJD (degenerative joint disease) 10/19/2019   AAA (abdominal aortic aneurysm) without rupture ([place]) 10/18/2019   PCP:  Perrin Maltese, MD Pharmacy:   [place], [place] - [address] [place] [address] Phone: [phone] Fax: [phone]  [facility] [place], [place] - [address] [place] [address] Phone: [phone] Fax: [phone]  [id number] [address], [place] [address] [place] [address] Phone: [phone] Fax: [phone]     Social Determinants of Health (SDOH) Interventions Food Insecurity Interventions: Intervention Not  Indicated Housing Interventions: Intervention Not Indicated Transportation Interventions: Intervention Not Indicated Utilities Interventions: Intervention Not Indicated  Readmission Risk Interventions     No data to display

## 2021-12-07 NOTE — Care Management Obs Status (Signed)
MEDICARE OBSERVATION STATUS NOTIFICATION   Patient Details  Name: Bianca Horton MRN: [id number] Date of Birth: 17-Jun-1935   Medicare Observation Status Notification Given:  Yes    Candie Chroman, LCSW 12/07/2021, 8:52 AM

## 2021-12-07 NOTE — Evaluation (Signed)
Physical Therapy Evaluation Patient Details Name: Bianca Horton MRN: [id number] DOB: [birth year] Today's Date: 12/07/2021  History of Present Illness  Patient is an 86 year old female who presents after mechanical fall while using walker to ambulate to bathroom at night. Patient found to have R acetabular fx, R pubic ramus fracture, R sacral ala fracture. Per Dr. Sharlet Salina patient is PWB RLE. PMh includes HTN, HLD, AAA, hypothyroidism, depression, CKD stage III, tobacco abuse.   Clinical Impression  Patient is a pleasant 87 year old female. Prior to hospital admission, pt was ambulatory with walker and living in a skilled nursing facility. Patient history limited. Per patient she was independent prior to moving to her new facility. She cannot remember the name of her new facility. Patient is very HOH and fearful of movement due to pain. Patient is alert and oriented to herself, location, reason for hospitalization, but limited on complex history questions.  Patient is in bed upon PT arrival and awakens with gentle verbal cueing. She is agreeable to participate with physical therapy session after education on need. Patient requires max A to sit EOB however this primarily appears to be due to fear of movement due to pain. Patient maintains sitting with UE support with slight lateral lean to offweight affected RLE. Patient educated on PWB status and performs stand with Max A and cues for sequencing and safety. Patient refused ambulation, refused transfer to chair, and so was returned to bed. Nursing entered room and patient assisted in rolling for skin assessment requiring max A to roll due to pain. Patient left with needs met and nursing present in room. Pt would benefit from skilled PT to address noted impairments and functional limitations (see below for any additional details).  Upon hospital discharge, pt would benefit from return to SNF due to limited mobility and safety at this time.         Recommendations for follow up therapy are one component of a multi-disciplinary discharge planning process, led by the attending physician.  Recommendations may be updated based on patient status, additional functional criteria and insurance authorization.  Follow Up Recommendations Skilled nursing-short term rehab (<3 hours/day)      Assistance Recommended at Discharge    Patient can return home with the following  A lot of help with walking and/or transfers;A lot of help with bathing/dressing/bathroom;Assistance with cooking/housework;Assist for transportation;Help with stairs or ramp for entrance    Equipment Recommendations Other (comment) (none if return to SNF)  Recommendations for Other Services       Functional Status Assessment Patient has had a recent decline in their functional status and demonstrates the ability to make significant improvements in function in a reasonable and predictable amount of time.     Precautions / Restrictions Precautions Precautions: Fall Restrictions Weight Bearing Restrictions: Yes RLE Weight Bearing: Partial weight bearing RLE Partial Weight Bearing Percentage or Pounds: 25-50%      Mobility  Bed Mobility Overal bed mobility: Needs Assistance Bed Mobility: Rolling, Supine to Sit, Sit to Supine Rolling: Max assist   Supine to sit: Max assist Sit to supine: Max assist   General bed mobility comments: Patient requires max A for sitting EOB due to pain making patient fearful of all mobility.    Transfers Overall transfer level: Needs assistance Equipment used: Rolling walker (2 wheels) Transfers: Sit to/from Stand Sit to Stand: Max assist           General transfer comment: education and cueing for PWB status.  Ambulation/Gait               General Gait Details: patient refuse ambulation  Stairs            Wheelchair Mobility    Modified Rankin (Stroke Patients Only)       Balance Overall balance  assessment: History of Falls, Needs assistance Sitting-balance support: Bilateral upper extremity supported Sitting balance-Leahy Scale: Fair Sitting balance - Comments: able to sit, slight lean from R hip due to pain Postural control: Left lateral lean Standing balance support: Bilateral upper extremity supported, Reliant on assistive device for balance Standing balance-Leahy Scale: Poor Standing balance comment: Patient still learning PWB status                             Pertinent Vitals/Pain Pain Assessment Pain Assessment: 0-10 Pain Score: 9  Pain Location: R hip Pain Descriptors / Indicators: Sharp, Stabbing Pain Intervention(s): Limited activity within patient's tolerance, Monitored during session, Repositioned, RN gave pain meds during session    Home Living Family/patient expects to be discharged to:: Skilled nursing facility                   Additional Comments: Patient unable to remember name of facility but remembers she came from a facility.    Prior Function Prior Level of Function : Needs assist;Patient poor historian/Family not available             Mobility Comments: Per patient she walks with a walker for short distances. ADLs Comments: Per patient she was at a different facility and completely independent but upon transferring to her new facility she "fell apart" and needs assistance.     Hand Dominance        Extremity/Trunk Assessment   Upper Extremity Assessment Upper Extremity Assessment: Defer to OT evaluation    Lower Extremity Assessment Lower Extremity Assessment: LLE deficits/detail;RLE deficits/detail RLE: Unable to fully assess due to pain RLE Sensation: decreased light touch RLE Coordination: decreased gross motor LLE Deficits / Details: grossly 3+/5 with ankle 4-/5 LLE Sensation: WNL LLE Coordination: WNL       Communication   Communication: HOH  Cognition Arousal/Alertness: Awake/alert Behavior During  Therapy: WFL for tasks assessed/performed Overall Cognitive Status: Difficult to assess                                 General Comments: Able to follow commands but HOH. Cannot remember name of facility. Conflicting answers for PLOF.        General Comments General comments (skin integrity, edema, etc.): multiple contusions throughout body. Large hematoma on head.    Exercises General Exercises - Lower Extremity Ankle Circles/Pumps: 15 reps, Supine Other Exercises Other Exercises: Patient educated on role of PT in acute care setting. She requires extensive education on [facility] however unsure of patient full understanding at this time. Education on safe mobility and transfers for decreased fall risk.   Assessment/Plan    PT Assessment Patient needs continued PT services  PT Problem List Decreased strength;Decreased activity tolerance;Decreased balance;Decreased mobility;Decreased coordination;Decreased safety awareness;Decreased knowledge of precautions;Pain;Obesity       PT Treatment Interventions DME instruction;Gait training;Functional mobility training;Neuromuscular re-education;Balance training;Therapeutic exercise;Therapeutic activities;Cognitive remediation;Patient/family education;Wheelchair mobility training;Manual techniques    PT Goals (Current goals can be found in the Care Plan section)  Acute Rehab PT Goals Patient Stated Goal: to stop being in  so much pain PT Goal Formulation: With patient Time For Goal Achievement: 12/21/21 Potential to Achieve Goals: Fair    Frequency 7X/week     Co-evaluation               AM-PAC PT "6 Clicks" Mobility  Outcome Measure Help needed turning from your back to your side while in a flat bed without using bedrails?: A Lot Help needed moving from lying on your back to sitting on the side of a flat bed without using bedrails?: A Lot Help needed moving to and from a bed to a chair (including a wheelchair)?: A  Lot Help needed standing up from a chair using your arms (e.g., wheelchair or bedside chair)?: A Lot Help needed to walk in hospital room?: A Lot Help needed climbing 3-5 steps with a railing? : Total 6 Click Score: 11    End of Session Equipment Utilized During Treatment: Gait belt Activity Tolerance: Patient limited by pain Patient left: in bed;with call bell/phone within reach;with nursing/sitter in room Nurse Communication: Mobility status;Weight bearing status PT Visit Diagnosis: Unsteadiness on feet (R26.81);Other abnormalities of gait and mobility (R26.89);Muscle weakness (generalized) (M62.81);History of falling (Z91.81);Difficulty in walking, not elsewhere classified (R26.2);Pain Pain - Right/Left: Right Pain - part of body: Hip    Time: 7209-4709 PT Time Calculation (min) (ACUTE ONLY): 17 min   Charges:   PT Evaluation $PT Eval Moderate Complexity: 1 Mod         Janna Arch, PT, DPT  12/07/2021, 9:07 AM

## 2021-12-07 NOTE — NC FL2 (Signed)
[place] LEVEL OF CARE SCREENING TOOL     IDENTIFICATION  Patient Name: Bianca Horton Birthdate: January 05, 1936 Sex: female Admission Date (Current Location): 12/06/2021  [facility] and [place] Number:  Engineering geologist and Address:  [facility], [address] [place], [place] [address]      Provider Number: [id number]  Attending Physician Name and Address:  Wyvonnia Dusky, MD  Relative Name and Phone Number:  Felecia Shelling [phone]    Current Level of Care: Hospital Recommended Level of Care: [place] Prior Approval Number:    Date Approved/Denied:   PASRR Number: [id number]  Discharge Plan: SNF    Current Diagnoses: Patient Active Problem List   Diagnosis Date Noted   Closed nondisplaced fracture of right acetabulum ([place]) 12/07/2021   Intractable pain 12/06/2021   Closed nondisplaced fracture of right acetabulum, unspecified portion of acetabulum, initial encounter ([place]) 12/06/2021   Hypothyroidism 12/06/2021   Inferior pubic ramus fracture-right 12/06/2021   Fall at home, initial encounter 12/06/2021   Chronic kidney disease, stage 3a ([place]) 12/06/2021   Tobacco abuse 12/06/2021   Bronchitis 12/06/2021   Thrombocytopenia ([place]) 12/06/2021   Acute bronchitis 12/06/2021   Essential hypertension 10/19/2019   Hyperlipidemia 10/19/2019   DJD (degenerative joint disease) 10/19/2019   AAA (abdominal aortic aneurysm) without rupture ([place]) 10/18/2019    Orientation RESPIRATION BLADDER Height & Weight     Self, Time, Place, Situation  O2 (nasal cannula, 2 L) Continent, External catheter Weight: 123 lb 14.4 oz (56.2 kg) Height:  5\' 3"  (160 cm)  BEHAVIORAL SYMPTOMS/MOOD NEUROLOGICAL BOWEL NUTRITION STATUS  Other (Comment) (none)  (none) Continent Diet (heart healthy)  AMBULATORY STATUS COMMUNICATION OF NEEDS Skin   Extensive Assist Verbally Skin abrasions, Other (Comment),  Bruising (blister. laceration on right side of head; gauze)                       Personal Care Assistance Level of Assistance  Bathing, Feeding, Dressing Bathing Assistance: Maximum assistance Feeding assistance: Limited assistance Dressing Assistance: Maximum assistance     Functional Limitations Info  Hearing, Speech, Sight Sight Info: Adequate Hearing Info: Impaired Speech Info: Adequate    SPECIAL CARE FACTORS FREQUENCY  OT (By licensed OT), PT (By licensed PT)     PT Frequency: 5x a week OT Frequency: 5x a week            Contractures Contractures Info: Not present    Additional Factors Info  Code Status, Allergies Code Status Info: Full Code Allergies Info: Clopidogrel and Sertraline Hcl           Current Medications (12/07/2021):  This is the current hospital active medication list Current Facility-Administered Medications  Medication Dose Route Frequency Provider Last Rate Last Admin   acetaminophen (TYLENOL) tablet 650 mg  650 mg Oral Q6H PRN Ivor Costa, MD       albuterol (PROVENTIL) (2.5 MG/3ML) 0.083% nebulizer solution 3 mL  3 mL Inhalation Q4H PRN Ivor Costa, MD       azithromycin [facility]) tablet 250 mg  250 mg Oral Daily Ivor Costa, MD   250 mg at 12/07/21 8250   buPROPion (WELLBUTRIN XL) 24 hr tablet 150 mg  150 mg Oral Daily Ivor Costa, MD   150 mg at 12/07/21 5397   calcium carbonate (TUMS - dosed in mg elemental calcium) chewable tablet 200 mg of elemental calcium  1 tablet Oral BID PRN Ivor Costa, MD  carvedilol (COREG) tablet 25 mg  25 mg Oral BID WC Ivor Costa, MD   25 mg at 12/07/21 0801   cilostazol (PLETAL) tablet 100 mg  100 mg Oral BID Ivor Costa, MD   100 mg at 12/07/21 [id number]   dextromethorphan-guaiFENesin ([place] DM) 30-600 MG per 12 hr tablet 1 tablet  1 tablet Oral BID PRN Ivor Costa, MD       enalapril (VASOTEC) tablet 2.5 mg  2.5 mg Oral Daily Ivor Costa, MD   2.5 mg at 12/07/21 0917   enoxaparin (LOVENOX) injection 30 mg   30 mg Subcutaneous Q24H Wyvonnia Dusky, MD       folic acid (FOLVITE) tablet 1 mg  1 mg Oral Daily Ivor Costa, MD   1 mg at 12/07/21 [id number]   hydrALAZINE (APRESOLINE) injection 5 mg  5 mg Intravenous Q2H PRN Ivor Costa, MD       levothyroxine (SYNTHROID) tablet 125 mcg  125 mcg Oral q morning Ivor Costa, MD   125 mcg at 12/07/21 0446   lidocaine (LIDODERM) 5 % 1 patch  1 patch Transdermal Q24H Ivor Costa, MD   1 patch at 12/07/21 0448   methocarbamol (ROBAXIN) tablet 500 mg  500 mg Oral Q8H PRN Ivor Costa, MD   500 mg at 12/07/21 0446   morphine (PF) 2 MG/ML injection 0.5 mg  0.5 mg Intravenous Q4H PRN Ivor Costa, MD       nicotine (NICODERM CQ - dosed in mg/24 hours) patch 21 mg  21 mg Transdermal Daily Ivor Costa, MD       ondansetron [facility]) injection 4 mg  4 mg Intravenous Q8H PRN Ivor Costa, MD       oxyCODONE-acetaminophen (PERCOCET/ROXICET) 5-325 MG per tablet 1 tablet  1 tablet Oral Q4H PRN Ivor Costa, MD   1 tablet at 12/07/21 0837   simvastatin (ZOCOR) tablet 40 mg  40 mg Oral q1800 Ivor Costa, MD   40 mg at 12/06/21 2323   SM Double Antibiotic 500-10000 UNIT/GM OINT 1 Application  1 Application Topical BID Ivor Costa, MD   1 Application at [id number]     Discharge Medications: Please see discharge summary for a list of discharge medications.  Relevant Imaging Results:  Relevant Lab Results:   Additional Information SS# [id number]  Colen Darling, [place]

## 2021-12-08 DIAGNOSIS — D696 Thrombocytopenia, unspecified: Secondary | ICD-10-CM | POA: Diagnosis not present

## 2021-12-08 DIAGNOSIS — S32401A Unspecified fracture of right acetabulum, initial encounter for closed fracture: Secondary | ICD-10-CM | POA: Diagnosis not present

## 2021-12-08 DIAGNOSIS — S32591A Other specified fracture of right pubis, initial encounter for closed fracture: Secondary | ICD-10-CM | POA: Diagnosis not present

## 2021-12-08 MED ORDER — SODIUM CHLORIDE 0.9 % IV BOLUS
500.0000 mL | Freq: Once | INTRAVENOUS | Status: AC
  Administered 2021-12-08: 500 mL via INTRAVENOUS

## 2021-12-08 MED ORDER — SODIUM CHLORIDE 0.9 % IV SOLN: INTRAVENOUS | Status: DC

## 2021-12-08 NOTE — Progress Notes (Signed)
Physical Therapy Treatment Patient Details Name: Bianca Horton MRN: [id number] DOB: Apr 30, 1935 Today's Date: 12/08/2021   History of Present Illness Patient is an 86 year old female who presents after mechanical fall while using walker to ambulate to bathroom at night. Patient found to have R acetabular fx, R pubic ramus fracture, R sacral ala fracture. Per Dr. Sharlet Salina patient is PWB RLE. PMh includes HTN, HLD, AAA, hypothyroidism, depression, CKD stage III, tobacco abuse.    PT Comments    2nd attempt made to work with pt for PT after 1st attempt (pt was eating breakfast and agreed to work with PT afterwards).  Pt alert initially during this session but gradually began to doze and this PTA had difficulty engaging pt to participate or respond to instructions.  Pt declined OOB bed activity or sitting up at side of bed. Pt unable to explain why except that she was sleepy.  Performed AAROM LE ther. Ex to increase pt's tolerance to LE movement and to try to engage pt in participation.  Current PT d/c plan is still appropriate.  Recommendations for follow up therapy are one component of a multi-disciplinary discharge planning process, led by the attending physician.  Recommendations may be updated based on patient status, additional functional criteria and insurance authorization.  Follow Up Recommendations  Skilled nursing-short term rehab (<3 hours/day)     Assistance Recommended at Discharge    Patient can return home with the following A lot of help with walking and/or transfers;A lot of help with bathing/dressing/bathroom;Assistance with cooking/housework;Assist for transportation;Help with stairs or ramp for entrance;Other (comment)   Equipment Recommendations  Other (comment) (non is she returns to SNF)    Recommendations for Other Services       Precautions / Restrictions Precautions Precautions: Fall Restrictions Weight Bearing Restrictions: Yes RLE Weight Bearing: Partial  weight bearing RLE Partial Weight Bearing Percentage or Pounds: 25-50%     Mobility  Bed Mobility               General bed mobility comments: Pt declined OOB bed activity or sitting up at side of bed. Pt unable to explain why except that she was sleepy.    Transfers                   General transfer comment: Pt declined OOB bed activity or sitting up at side of bed. Pt unable to explain why except that she was sleepy.    Ambulation/Gait               General Gait Details: patient refuse ambulation   Stairs             Wheelchair Mobility    Modified Rankin (Stroke Patients Only)       Balance       Sitting balance - Comments: Pt declined OOB bed activity or sitting up at side of bed. Pt unable to explain why except that she was sleepy.                                    Cognition Arousal/Alertness: Lethargic Behavior During Therapy: Flat affect Overall Cognitive Status: Difficult to assess                                 General Comments: HOH, questionable historian, requires VC for simple commands  Exercises Total Joint Exercises Quad Sets: AROM, Strengthening, Both, 5 reps, Supine Heel Slides: AAROM, Strengthening, Both, 10 reps, Supine    General Comments        Pertinent Vitals/Pain Pain Assessment Pain Assessment: Faces Faces Pain Scale: Hurts little more Pain Location: Pt reported no pain awhen asked but groans when LEs are moved. Pain Intervention(s): Limited activity within patient's tolerance    Home Living                          Prior Function            PT Goals (current goals can now be found in the care plan section) Acute Rehab PT Goals Patient Stated Goal: to stop being in so much pain PT Goal Formulation: With patient Time For Goal Achievement: 12/21/21 Potential to Achieve Goals: Fair Progress towards PT goals: PT to reassess next treatment     Frequency    7X/week      PT Plan      Co-evaluation              AM-PAC PT "6 Clicks" Mobility   Outcome Measure  Help needed turning from your back to your side while in a flat bed without using bedrails?: A Lot Help needed moving from lying on your back to sitting on the side of a flat bed without using bedrails?: A Lot Help needed moving to and from a bed to a chair (including a wheelchair)?: A Lot Help needed standing up from a chair using your arms (e.g., wheelchair or bedside chair)?: A Lot Help needed to walk in hospital room?: A Lot Help needed climbing 3-5 steps with a railing? : Total 6 Click Score: 11    End of Session   Activity Tolerance: Patient limited by lethargy Patient left: in bed;with call bell/phone within reach;with nursing/sitter in room Nurse Communication: Mobility status;Weight bearing status PT Visit Diagnosis: Unsteadiness on feet (R26.81);Other abnormalities of gait and mobility (R26.89);Muscle weakness (generalized) (M62.81);History of falling (Z91.81);Difficulty in walking, not elsewhere classified (R26.2);Pain Pain - Right/Left: Right Pain - part of body: Hip     Time: 1030-1050 PT Time Calculation (min) (ACUTE ONLY): 20 min  Charges:  $Therapeutic Activity: 8-22 mins                     Bjorn Loser, PTA  12/08/21, 11:18 AM

## 2021-12-08 NOTE — TOC Progression Note (Addendum)
Transition of Care [facility]) - Progression Note    Patient Details  Name: Bianca Horton MRN: [id number] Date of Birth: 03-21-1935  Transition of Care [facility]) CM/SW Contact  Bryahna Lesko, [place], [place] Phone Number: 12/08/2021, 9:26 AM  Clinical Narrative:     Phone call to patient's POA to confirm bed offer for [facility]. Authorization to be started by [facility] team once discharge date  is confirmed.   Henchy Mccauley [address], LCSW Transition of Care [phone]   Expected Discharge Plan: [place] Barriers to Discharge: Continued Medical Work up, Ship broker, SNF Pending bed offer  Expected Discharge Plan and Services Expected Discharge Plan: [place] Choice: [place] Living arrangements for the past 2 months: Assisted Living Facility                                       Social Determinants of Health (SDOH) Interventions Food Insecurity Interventions: Intervention Not Indicated Housing Interventions: Intervention Not Indicated Transportation Interventions: Intervention Not Indicated Utilities Interventions: Intervention Not Indicated  Readmission Risk Interventions     No data to display

## 2021-12-08 NOTE — Progress Notes (Signed)
PROGRESS NOTE    Bianca Horton  [id number] DOB: 1936/01/11 DOA: 12/06/2021 PCP: Perrin Maltese, MD  Assessment & Plan:   Principal Problem:   Intractable pain Active Problems:   Closed nondisplaced fracture of right acetabulum, unspecified portion of acetabulum, initial encounter ([place])   Inferior pubic ramus fracture-right   Essential hypertension   Chronic kidney disease, stage 3a ([place])   Fall at home, initial encounter   Hyperlipidemia   Hypothyroidism   Bronchitis   Tobacco abuse   Thrombocytopenia ([place])   AAA (abdominal aortic aneurysm) without rupture ([facility])   Closed nondisplaced fracture of right acetabulum ([facility])  Assessment and Plan:   Closed nondisplaced fracture of right acetabulum: percocet, morphine prn for pain. Partial weightbearing of RLE as per orth surg    Inferior right pubic ramus fracture & right sacral ala fracture: percocet, morphine prn. Partial weightbearing of RLE as per ortho surg. F/u outpatient w/ ortho surg, Dr. Sharlet Salina, in 3-4 weeks    HTN: continue on enalapril, coreg. IV hydralazine prn    CKDIIIa: Cr is trending up slightly from day prior. Avoid nephrotoxic meds    Fall: at home. CT head negative.  CT of C-spine negative for acute bony fracture, but showed degenerative disc disease.  PT/OT recs SNF    HLD: continue on statin    Hypothyroidism: continue on levothyroxine   Possible bronchitis: continue on azithromycin, bronchodilators & encourage incentive spirometry    Tobacco abuse: received smoking cessation counseling x 5 mins. Nicotine patch to prevent w/drawl    Thrombocytopenia: etiology unclear. Will continue to monitor. Peripheral smear WNL    AAA: will need f/u w/ PCP         DVT prophylaxis: lovenox Code Status: full  Family Communication:  Disposition Plan: likely d/c to [facility] on 10/9  Level of care: Telemetry Medical  Status is: Inpatient Remains inpatient appropriate because: likely d/c to [facility] on 10/9    Consultants:  Ortho surg   Procedures:   Antimicrobials: azithromycin   Subjective: Pt c/o pelvic pain, slightly improved today   Objective: Vitals:   12/07/21 1637 12/07/21 2000 12/08/21 0513 12/08/21 0744  BP: 136/76 115/63 (!) 95/56 119/63  Pulse: 92 83 82 86  Resp: 16 18 18 12   Temp: 97.7 F (36.5 C) 98 F (36.7 C) 98 F (36.7 C) 98.5 F (36.9 C)  TempSrc:  Oral  Oral  SpO2: 95% 91% 94% 95%  Weight:      Height:        Intake/Output Summary (Last 24 hours) at 12/08/2021 1416 Last data filed at 12/08/2021 1000 Gross per 24 hour  Intake 120 ml  Output 200 ml  Net -80 ml   Filed Weights   12/06/21 2300 12/07/21 0453  Weight: 56.2 kg 56.2 kg    Examination:  General exam: Appears comfortable  Respiratory system: clear breath sounds b/l Cardiovascular system:  S1/S2+. No rubs or clicks  Gastrointestinal system: Abd is soft, NT, ND & hypoactive bowel sounds  Central nervous system: Alert and awake. Moves all extremities  Psychiatry: Judgement and insight appears at baseline. Flat mood and affect     Data Reviewed: I have personally reviewed following labs and imaging studies  CBC: Recent Labs  Lab 12/06/21 0543 12/07/21 0452  WBC 10.7* 7.4  NEUTROABS 8.2*  --   HGB 8.9* 8.3*  HCT 27.7* 25.6*  MCV 101.8* 101.6*  PLT 118* [id number]*   Basic Metabolic Panel: Recent Labs  Lab  12/06/21 0543 12/07/21 0452  NA 133* 135  K 4.1 4.6  CL 102 105  CO2 24 26  GLUCOSE 114* 80  BUN 42* 46*  CREATININE 1.27* 1.31*  CALCIUM 9.2 9.3   GFR: Estimated Creatinine Clearance: 26.9 mL/min (A) (by C-G formula based on SCr of 1.31 mg/dL (H)). Liver Function Tests: Recent Labs  Lab 12/06/21 0543  AST 21  ALT 14  ALKPHOS 65  BILITOT 0.6  PROT 5.5*  ALBUMIN 2.6*   No results for input(s): "LIPASE", "AMYLASE" in the last 168 hours. No results for input(s): "AMMONIA" in the last 168 hours. Coagulation Profile: Recent Labs  Lab  12/06/21 0543  INR 1.1   Cardiac Enzymes: No results for input(s): "CKTOTAL", "CKMB", "CKMBINDEX", "TROPONINI" in the last 168 hours. BNP (last 3 results) No results for input(s): "PROBNP" in the last 8760 hours. HbA1C: No results for input(s): "HGBA1C" in the last 72 hours. CBG: No results for input(s): "GLUCAP" in the last 168 hours. Lipid Profile: No results for input(s): "CHOL", "HDL", "LDLCALC", "TRIG", "CHOLHDL", "LDLDIRECT" in the last 72 hours. Thyroid Function Tests: No results for input(s): "TSH", "T4TOTAL", "FREET4", "T3FREE", "THYROIDAB" in the last 72 hours. Anemia Panel: No results for input(s): "VITAMINB12", "FOLATE", "FERRITIN", "TIBC", "IRON", "RETICCTPCT" in the last 72 hours. Sepsis Labs: No results for input(s): "PROCALCITON", "LATICACIDVEN" in the last 168 hours.  Recent Results (from the past 240 hour(s))  SARS Coronavirus 2 by RT PCR (hospital order, performed in [facility] hospital lab) *cepheid single result test* Anterior Nasal Swab     Status: None   Collection Time: 12/06/21  6:07 AM   Specimen: Anterior Nasal Swab  Result Value Ref Range Status   SARS Coronavirus 2 by RT PCR NEGATIVE NEGATIVE Final    Comment: (NOTE) SARS-CoV-2 target nucleic acids are NOT DETECTED.  The SARS-CoV-2 RNA is generally detectable in upper and lower respiratory specimens during the acute phase of infection. The lowest concentration of SARS-CoV-2 viral copies this assay can detect is 250 copies / mL. A negative result does not preclude SARS-CoV-2 infection and should not be used as the sole basis for treatment or other patient management decisions.  A negative result may occur with improper specimen collection / handling, submission of specimen other than nasopharyngeal swab, presence of viral mutation(s) within the areas targeted by this assay, and inadequate number of viral copies (<250 copies / mL). A negative result must be combined with clinical observations,  patient history, and epidemiological information.  Fact Sheet for Patients:   https://www.patel.info/  Fact Sheet for Healthcare Providers: https://hall.com/  This test is not yet approved or  cleared by the [place] FDA and has been authorized for detection and/or diagnosis of SARS-CoV-2 by FDA under an Emergency Use Authorization (EUA).  This EUA will remain in effect (meaning this test can be used) for the duration of the COVID-19 declaration under Section 564(b)(1) of the Act, 21 U.S.C. section 360bbb-3(b)(1), unless the authorization is terminated or revoked sooner.  Performed at [facility], [address], [place], [place] [address]          Radiology Studies: No results found.      Scheduled Meds:  azithromycin  250 mg Oral Daily   buPROPion  150 mg Oral Daily   carvedilol  25 mg Oral BID WC   cilostazol  100 mg Oral BID   enalapril  2.5 mg Oral Daily   enoxaparin (LOVENOX) injection  30 mg Subcutaneous [id number]   folic  acid  1 mg Oral Daily   levothyroxine  125 mcg Oral q morning   lidocaine  1 patch Transdermal Q24H   nicotine  21 mg Transdermal Daily   simvastatin  40 mg Oral q1800   SM Double Antibiotic  1 Application Topical BID   Continuous Infusions:   LOS: 1 day    Time spent: 30 mins     Wyvonnia Dusky, MD [facility] Pager [phone]  If 7PM-7AM, please contact night-coverage [email or web address] 12/08/2021, 2:16 PM '

## 2021-12-09 DIAGNOSIS — Z716 Tobacco abuse counseling: Secondary | ICD-10-CM | POA: Diagnosis not present

## 2021-12-09 DIAGNOSIS — Z888 Allergy status to other drugs, medicaments and biological substances status: Secondary | ICD-10-CM | POA: Diagnosis not present

## 2021-12-09 DIAGNOSIS — J209 Acute bronchitis, unspecified: Secondary | ICD-10-CM | POA: Diagnosis not present

## 2021-12-09 DIAGNOSIS — S32119A Unspecified Zone I fracture of sacrum, initial encounter for closed fracture: Secondary | ICD-10-CM | POA: Diagnosis present

## 2021-12-09 DIAGNOSIS — W19XXXA Unspecified fall, initial encounter: Secondary | ICD-10-CM | POA: Diagnosis not present

## 2021-12-09 DIAGNOSIS — Z9981 Dependence on supplemental oxygen: Secondary | ICD-10-CM | POA: Diagnosis not present

## 2021-12-09 DIAGNOSIS — Z23 Encounter for immunization: Secondary | ICD-10-CM | POA: Diagnosis not present

## 2021-12-09 DIAGNOSIS — J40 Bronchitis, not specified as acute or chronic: Secondary | ICD-10-CM | POA: Diagnosis not present

## 2021-12-09 DIAGNOSIS — F32A Depression, unspecified: Secondary | ICD-10-CM | POA: Diagnosis not present

## 2021-12-09 DIAGNOSIS — Z79890 Hormone replacement therapy (postmenopausal): Secondary | ICD-10-CM | POA: Diagnosis not present

## 2021-12-09 DIAGNOSIS — R52 Pain, unspecified: Secondary | ICD-10-CM | POA: Diagnosis present

## 2021-12-09 DIAGNOSIS — Z1152 Encounter for screening for COVID-19: Secondary | ICD-10-CM | POA: Diagnosis not present

## 2021-12-09 DIAGNOSIS — I129 Hypertensive chronic kidney disease with stage 1 through stage 4 chronic kidney disease, or unspecified chronic kidney disease: Secondary | ICD-10-CM | POA: Diagnosis not present

## 2021-12-09 DIAGNOSIS — S32591A Other specified fracture of right pubis, initial encounter for closed fracture: Secondary | ICD-10-CM | POA: Diagnosis not present

## 2021-12-09 DIAGNOSIS — Z720 Tobacco use: Secondary | ICD-10-CM | POA: Diagnosis not present

## 2021-12-09 DIAGNOSIS — S3210XD Unspecified fracture of sacrum, subsequent encounter for fracture with routine healing: Secondary | ICD-10-CM | POA: Diagnosis not present

## 2021-12-09 DIAGNOSIS — W19XXXD Unspecified fall, subsequent encounter: Secondary | ICD-10-CM | POA: Diagnosis not present

## 2021-12-09 DIAGNOSIS — E039 Hypothyroidism, unspecified: Secondary | ICD-10-CM | POA: Diagnosis not present

## 2021-12-09 DIAGNOSIS — I714 Abdominal aortic aneurysm, without rupture, unspecified: Secondary | ICD-10-CM | POA: Diagnosis not present

## 2021-12-09 DIAGNOSIS — H9190 Unspecified hearing loss, unspecified ear: Secondary | ICD-10-CM | POA: Diagnosis present

## 2021-12-09 DIAGNOSIS — S32591D Other specified fracture of right pubis, subsequent encounter for fracture with routine healing: Secondary | ICD-10-CM | POA: Diagnosis not present

## 2021-12-09 DIAGNOSIS — S32401A Unspecified fracture of right acetabulum, initial encounter for closed fracture: Secondary | ICD-10-CM | POA: Diagnosis not present

## 2021-12-09 DIAGNOSIS — Z79899 Other long term (current) drug therapy: Secondary | ICD-10-CM | POA: Diagnosis not present

## 2021-12-09 DIAGNOSIS — Z7982 Long term (current) use of aspirin: Secondary | ICD-10-CM | POA: Diagnosis not present

## 2021-12-09 DIAGNOSIS — E7800 Pure hypercholesterolemia, unspecified: Secondary | ICD-10-CM | POA: Diagnosis not present

## 2021-12-09 DIAGNOSIS — Z7401 Bed confinement status: Secondary | ICD-10-CM | POA: Diagnosis not present

## 2021-12-09 DIAGNOSIS — S329XXA Fracture of unspecified parts of lumbosacral spine and pelvis, initial encounter for closed fracture: Secondary | ICD-10-CM | POA: Diagnosis present

## 2021-12-09 DIAGNOSIS — S32401D Unspecified fracture of right acetabulum, subsequent encounter for fracture with routine healing: Secondary | ICD-10-CM | POA: Diagnosis not present

## 2021-12-09 DIAGNOSIS — G8911 Acute pain due to trauma: Secondary | ICD-10-CM | POA: Diagnosis present

## 2021-12-09 DIAGNOSIS — W010XXA Fall on same level from slipping, tripping and stumbling without subsequent striking against object, initial encounter: Secondary | ICD-10-CM | POA: Diagnosis present

## 2021-12-09 DIAGNOSIS — D696 Thrombocytopenia, unspecified: Secondary | ICD-10-CM | POA: Diagnosis not present

## 2021-12-09 DIAGNOSIS — S0181XA Laceration without foreign body of other part of head, initial encounter: Secondary | ICD-10-CM | POA: Diagnosis not present

## 2021-12-09 DIAGNOSIS — R6889 Other general symptoms and signs: Secondary | ICD-10-CM | POA: Diagnosis not present

## 2021-12-09 DIAGNOSIS — F17210 Nicotine dependence, cigarettes, uncomplicated: Secondary | ICD-10-CM | POA: Diagnosis present

## 2021-12-09 DIAGNOSIS — N1831 Chronic kidney disease, stage 3a: Secondary | ICD-10-CM | POA: Diagnosis not present

## 2021-12-09 LAB — CBC
HCT: 22.1 % — ABNORMAL LOW (ref 36.0–46.0)
Hemoglobin: 7.2 g/dL — ABNORMAL LOW (ref 12.0–15.0)
MCH: 32.7 pg (ref 26.0–34.0)
MCHC: 32.6 g/dL (ref 30.0–36.0)
MCV: 100.5 fL — ABNORMAL HIGH (ref 80.0–100.0)
Platelets: 106 10*3/uL — ABNORMAL LOW (ref 150–400)
RBC: 2.2 MIL/uL — ABNORMAL LOW (ref 3.87–5.11)
RDW: 14.4 % (ref 11.5–15.5)
WBC: 7 10*3/uL (ref 4.0–10.5)
nRBC: 0 % (ref 0.0–0.2)

## 2021-12-09 LAB — BASIC METABOLIC PANEL
Anion gap: 3 — ABNORMAL LOW (ref 5–15)
BUN: 54 mg/dL — ABNORMAL HIGH (ref 8–23)
CO2: 22 mmol/L (ref 22–32)
Calcium: 8.3 mg/dL — ABNORMAL LOW (ref 8.9–10.3)
Chloride: 106 mmol/L (ref 98–111)
Creatinine, Ser: 1.67 mg/dL — ABNORMAL HIGH (ref 0.44–1.00)
GFR, Estimated: 30 mL/min — ABNORMAL LOW (ref >60)
Glucose, Bld: 90 mg/dL (ref 70–99)
Potassium: 4.2 mmol/L (ref 3.5–5.1)
Sodium: 131 mmol/L — ABNORMAL LOW (ref 135–145)

## 2021-12-09 MED ORDER — SODIUM CHLORIDE 0.9 % IV SOLN: INTRAVENOUS | Status: DC

## 2021-12-09 NOTE — Progress Notes (Signed)
PROGRESS NOTE    Bianca Horton  KXF:[id number] DOB: June 25, 1935 DOA: 12/06/2021 PCP: Perrin Maltese, MD  Assessment & Plan:   Principal Problem:   Intractable pain Active Problems:   Closed nondisplaced fracture of right acetabulum, unspecified portion of acetabulum, initial encounter ([place])   Inferior pubic ramus fracture-right   Essential hypertension   Chronic kidney disease, stage 3a ([place])   Fall at home, initial encounter   Hyperlipidemia   Hypothyroidism   Bronchitis   Tobacco abuse   Thrombocytopenia ([place])   AAA (abdominal aortic aneurysm) without rupture (HCC)   Closed nondisplaced fracture of right acetabulum (HCC)  Assessment and Plan:   Closed nondisplaced fracture of right acetabulum: percocet, morphine prn for pain. Partial weightbearing of RLE as per ortho surg    Inferior right pubic ramus fracture & right sacral ala fracture: percocet, morphine prn. Partial weightbearing of RLE as per ortho surg. F/u outpatient w/ ortho surg, Dr. Sharlet Salina, in 3-4 weeks    HTN: continue on coreg. Holding ACE-I. IV hydralazine prn    CKDIIIa: Cr continues to trend up daily. Avoid nephrotoxic meds    Fall: at home. CT head negative.  CT of C-spine negative for acute bony fracture, but showed degenerative disc disease.  PT/OT recs SNF    HLD: continue on statin    Hypothyroidism: continue on levothyroxine   Possible bronchitis: continue on bronchodilators, azithromycin & encourage incentive spirometry    Tobacco abuse: nicotine patch to prevent w/drawl. Received smoking cessation counseling x 5 mins   received smoking cessation counseling x 5 mins. Nicotine patch to prevent w/drawl    Thrombocytopenia: etiology unclear. Labile.    AAA: will need outpatient PCP f/u         DVT prophylaxis: lovenox Code Status: full  Family Communication:  Disposition Plan: likely d/c to [facility] on 10/9  Level of care: Telemetry Medical  Status is: Inpatient Remains  inpatient appropriate because: likely d/c to [facility] on 10/9    Consultants:  Ortho surg   Procedures:   Antimicrobials: azithromycin   Subjective: Pt c/o decreased appetite   Objective: Vitals:   12/08/21 2042 12/08/21 2242 12/09/21 0430 12/09/21 0741  BP: (!) 82/47 (!) 100/55 117/69 (!) 145/64  Pulse: 88 83 92 91  Resp: 16  18   Temp: 98.2 F (36.8 C)  97.9 F (36.6 C) 97.8 F (36.6 C)  TempSrc: Oral  Oral Oral  SpO2: 92%  94% 98%  Weight:      Height:        Intake/Output Summary (Last 24 hours) at 12/09/2021 0758 Last data filed at 12/09/2021 0513 Gross per 24 hour  Intake 1058.33 ml  Output 200 ml  Net 858.33 ml   Filed Weights   12/06/21 2300 12/07/21 0453  Weight: 56.2 kg 56.2 kg    Examination:  General exam: Appears calm & comfortable  Respiratory system: clear breath sounds b/l  Cardiovascular system:  S1 & S2+. No rubs or clicks  Gastrointestinal system: Abd is soft, NT, ND & hypoactive bowel sounds Central nervous system: Alert and awake. Moves all extremities  Psychiatry: Judgement and insight appears at baseline. Flat mood and affect    Data Reviewed: I have personally reviewed following labs and imaging studies  CBC: Recent Labs  Lab 12/06/21 0543 12/07/21 0452 12/09/21 0448  WBC 10.7* 7.4 7.0  NEUTROABS 8.2*  --   --   HGB 8.9* 8.3* 7.2*  HCT 27.7* 25.6* 22.1*  MCV 101.8*  101.6* 100.5*  PLT 118* 101* [id number]*   Basic Metabolic Panel: Recent Labs  Lab 12/06/21 0543 12/07/21 0452 12/09/21 0448  NA 133* 135 131*  K 4.1 4.6 4.2  CL 102 105 106  CO2 24 26 22   GLUCOSE 114* 80 90  BUN 42* 46* 54*  CREATININE 1.27* 1.31* 1.67*  CALCIUM 9.2 9.3 8.3*   GFR: Estimated Creatinine Clearance: 21.1 mL/min (A) (by C-G formula based on SCr of 1.67 mg/dL (H)). Liver Function Tests: Recent Labs  Lab 12/06/21 0543  AST 21  ALT 14  ALKPHOS 65  BILITOT 0.6  PROT 5.5*  ALBUMIN 2.6*   No results for input(s): "LIPASE",  "AMYLASE" in the last 168 hours. No results for input(s): "AMMONIA" in the last 168 hours. Coagulation Profile: Recent Labs  Lab 12/06/21 0543  INR 1.1   Cardiac Enzymes: No results for input(s): "CKTOTAL", "CKMB", "CKMBINDEX", "TROPONINI" in the last 168 hours. BNP (last 3 results) No results for input(s): "PROBNP" in the last 8760 hours. HbA1C: No results for input(s): "HGBA1C" in the last 72 hours. CBG: No results for input(s): "GLUCAP" in the last 168 hours. Lipid Profile: No results for input(s): "CHOL", "HDL", "LDLCALC", "TRIG", "CHOLHDL", "LDLDIRECT" in the last 72 hours. Thyroid Function Tests: No results for input(s): "TSH", "T4TOTAL", "FREET4", "T3FREE", "THYROIDAB" in the last 72 hours. Anemia Panel: No results for input(s): "VITAMINB12", "FOLATE", "FERRITIN", "TIBC", "IRON", "RETICCTPCT" in the last 72 hours. Sepsis Labs: No results for input(s): "PROCALCITON", "LATICACIDVEN" in the last 168 hours.  Recent Results (from the past 240 hour(s))  SARS Coronavirus 2 by RT PCR (hospital order, performed in [facility] hospital lab) *cepheid single result test* Anterior Nasal Swab     Status: None   Collection Time: 12/06/21  6:07 AM   Specimen: Anterior Nasal Swab  Result Value Ref Range Status   SARS Coronavirus 2 by RT PCR NEGATIVE NEGATIVE Final    Comment: (NOTE) SARS-CoV-2 target nucleic acids are NOT DETECTED.  The SARS-CoV-2 RNA is generally detectable in upper and lower respiratory specimens during the acute phase of infection. The lowest concentration of SARS-CoV-2 viral copies this assay can detect is 250 copies / mL. A negative result does not preclude SARS-CoV-2 infection and should not be used as the sole basis for treatment or other patient management decisions.  A negative result may occur with improper specimen collection / handling, submission of specimen other than nasopharyngeal swab, presence of viral mutation(s) within the areas targeted by this  assay, and inadequate number of viral copies (<250 copies / mL). A negative result must be combined with clinical observations, patient history, and epidemiological information.  Fact Sheet for Patients:   https://www.patel.info/  Fact Sheet for Healthcare Providers: https://hall.com/  This test is not yet approved or  cleared by the [place] FDA and has been authorized for detection and/or diagnosis of SARS-CoV-2 by FDA under an Emergency Use Authorization (EUA).  This EUA will remain in effect (meaning this test can be used) for the duration of the COVID-19 declaration under Section 564(b)(1) of the Act, 21 U.S.C. section 360bbb-3(b)(1), unless the authorization is terminated or revoked sooner.  Performed at [facility], [address], [place], [place] [address]          Radiology Studies: No results found.      Scheduled Meds:  azithromycin  250 mg Oral Daily   buPROPion  150 mg Oral Daily   carvedilol  25 mg Oral BID WC   cilostazol  100 mg Oral BID   enoxaparin (LOVENOX) injection  30 mg Subcutaneous [id number]   folic acid  1 mg Oral Daily   levothyroxine  125 mcg Oral q morning   lidocaine  1 patch Transdermal Q24H   nicotine  21 mg Transdermal Daily   simvastatin  40 mg Oral q1800   SM Double Antibiotic  1 Application Topical BID   Continuous Infusions:   LOS: 1 day    Time spent: 25 mins     Wyvonnia Dusky, MD [facility] Pager [phone]  If 7PM-7AM, please contact night-coverage [email or web address] 12/09/2021, 7:58 AM '

## 2021-12-09 NOTE — Progress Notes (Signed)
Physical Therapy Treatment Patient Details Name: Bianca Horton MRN: [id number] DOB: 1935-04-12 Today's Date: 12/09/2021   History of Present Illness Patient is an 86 year old female who presents after mechanical fall while using walker to ambulate to bathroom at night. Patient found to have R acetabular fx, R pubic ramus fracture, R sacral ala fracture. Per Dr. Sharlet Salina patient is PWB RLE. PMh includes HTN, HLD, AAA, hypothyroidism, depression, CKD stage III, tobacco abuse.    PT Comments    Participated in exercises as described below.  Limited by pain.  To EOB with mod a x 1 and much encouragement to assist with upper body.  She is generally steady in sitting with supervision.  Attempted to stand x 2 with RW and EOB.  She stands very briefly x 2 with mod a x 2 but sits very quickly before fully clearing hips off bed.  Unsafe to attempt transfer to chair.  Encouraged lateral scoots but she still requires max a.  Returns to bed with max a x 1 for LE assist.   Recommendations for follow up therapy are one component of a multi-disciplinary discharge planning process, led by the attending physician.  Recommendations may be updated based on patient status, additional functional criteria and insurance authorization.  Follow Up Recommendations  Skilled nursing-short term rehab (<3 hours/day)     Assistance Recommended at Discharge    Patient can return home with the following A lot of help with walking and/or transfers;A lot of help with bathing/dressing/bathroom;Assistance with cooking/housework;Assist for transportation;Help with stairs or ramp for entrance;Other (comment)   Equipment Recommendations       Recommendations for Other Services       Precautions / Restrictions Precautions Precautions: Fall Restrictions Weight Bearing Restrictions: Yes RLE Weight Bearing: Partial weight bearing RLE Partial Weight Bearing Percentage or Pounds: 25-50%     Mobility  Bed Mobility Overal  bed mobility: Needs Assistance Bed Mobility: Supine to Sit, Sit to Supine     Supine to sit: Mod assist Sit to supine: Max assist        Transfers Overall transfer level: Needs assistance Equipment used: Rolling walker (2 wheels) Transfers: Sit to/from Stand Sit to Stand: Mod assist, From elevated surface          Lateral/Scoot Transfers: Max assist General transfer comment: stood very briefly x 2 but barely clears hips off bed before sitting back down.    Ambulation/Gait               General Gait Details: patient refuse ambulation   Stairs             Wheelchair Mobility    Modified Rankin (Stroke Patients Only)       Balance Overall balance assessment: History of Falls, Needs assistance Sitting-balance support: Single extremity supported, Bilateral upper extremity supported Sitting balance-Leahy Scale: Fair     Standing balance support: Bilateral upper extremity supported, Reliant on assistive device for balance Standing balance-Leahy Scale: Poor Standing balance comment: pain limited                            Cognition Arousal/Alertness: Awake/alert Behavior During Therapy: WFL for tasks assessed/performed Overall Cognitive Status: Within Functional Limits for tasks assessed  Exercises Other Exercises Other Exercises: supine AAROM limited R by pain.  seated LAQ and ankle pumsp    General Comments        Pertinent Vitals/Pain Pain Assessment Pain Assessment: Faces Faces Pain Scale: Hurts even more Pain Location: facial grimmacing and some moaning especially with abduction Pain Descriptors / Indicators: Sore, Moaning Pain Intervention(s): Limited activity within patient's tolerance, Monitored during session, Repositioned    Home Living                          Prior Function            PT Goals (current goals can now be found in the care plan  section) Progress towards PT goals: Progressing toward goals    Frequency    7X/week      PT Plan Current plan remains appropriate    Co-evaluation              AM-PAC PT "6 Clicks" Mobility   Outcome Measure  Help needed turning from your back to your side while in a flat bed without using bedrails?: A Lot Help needed moving from lying on your back to sitting on the side of a flat bed without using bedrails?: A Lot Help needed moving to and from a bed to a chair (including a wheelchair)?: Total Help needed standing up from a chair using your arms (e.g., wheelchair or bedside chair)?: A Lot Help needed to walk in hospital room?: Total Help needed climbing 3-5 steps with a railing? : Total 6 Click Score: 9    End of Session Equipment Utilized During Treatment: Gait belt Activity Tolerance: Patient limited by pain Patient left: in bed;with call bell/phone within reach;with nursing/sitter in room Nurse Communication: Mobility status;Weight bearing status PT Visit Diagnosis: Unsteadiness on feet (R26.81);Other abnormalities of gait and mobility (R26.89);Muscle weakness (generalized) (M62.81);History of falling (Z91.81);Difficulty in walking, not elsewhere classified (R26.2);Pain Pain - Right/Left: Right Pain - part of body: Hip     Time: [id number] PT Time Calculation (min) (ACUTE ONLY): 14 min  Charges:  $Therapeutic Exercise: 8-22 mins                   Chesley Noon, PTA 12/09/21, 11:14 AM

## 2021-12-09 NOTE — TOC Progression Note (Signed)
Transition of Care [facility]) - Progression Note    Patient Details  Name: Bianca Horton MRN: [id number] Date of Birth: 10-18-1935  Transition of Care [facility]) CM/SW [place], LCSW Phone Number: 12/09/2021, 10:18 AM  Clinical Narrative:    SNF insurance auth started in [facility] portal.    Expected Discharge Plan: [place] Barriers to Discharge: Continued Medical Work up, Ship broker, SNF Pending bed offer  Expected Discharge Plan and Services Expected Discharge Plan: [place] Choice: [place] Living arrangements for the past 2 months: Assisted Living Facility                                       Social Determinants of Health (SDOH) Interventions Food Insecurity Interventions: Intervention Not Indicated Housing Interventions: Intervention Not Indicated Transportation Interventions: Intervention Not Indicated Utilities Interventions: Intervention Not Indicated  Readmission Risk Interventions     No data to display

## 2021-12-10 DIAGNOSIS — N1831 Chronic kidney disease, stage 3a: Secondary | ICD-10-CM | POA: Diagnosis not present

## 2021-12-10 DIAGNOSIS — S32401A Unspecified fracture of right acetabulum, initial encounter for closed fracture: Secondary | ICD-10-CM | POA: Diagnosis not present

## 2021-12-10 DIAGNOSIS — S32810D Multiple fractures of pelvis with stable disruption of pelvic ring, subsequent encounter for fracture with routine healing: Secondary | ICD-10-CM | POA: Diagnosis not present

## 2021-12-10 DIAGNOSIS — D6959 Other secondary thrombocytopenia: Secondary | ICD-10-CM | POA: Diagnosis not present

## 2021-12-10 DIAGNOSIS — I714 Abdominal aortic aneurysm, without rupture, unspecified: Secondary | ICD-10-CM | POA: Diagnosis not present

## 2021-12-10 DIAGNOSIS — Z7982 Long term (current) use of aspirin: Secondary | ICD-10-CM | POA: Diagnosis not present

## 2021-12-10 DIAGNOSIS — Z7401 Bed confinement status: Secondary | ICD-10-CM | POA: Diagnosis not present

## 2021-12-10 DIAGNOSIS — E782 Mixed hyperlipidemia: Secondary | ICD-10-CM | POA: Diagnosis not present

## 2021-12-10 DIAGNOSIS — F32A Depression, unspecified: Secondary | ICD-10-CM | POA: Diagnosis not present

## 2021-12-10 DIAGNOSIS — E039 Hypothyroidism, unspecified: Secondary | ICD-10-CM | POA: Diagnosis not present

## 2021-12-10 DIAGNOSIS — S32402A Unspecified fracture of left acetabulum, initial encounter for closed fracture: Secondary | ICD-10-CM | POA: Diagnosis not present

## 2021-12-10 DIAGNOSIS — E038 Other specified hypothyroidism: Secondary | ICD-10-CM | POA: Diagnosis not present

## 2021-12-10 DIAGNOSIS — W19XXXD Unspecified fall, subsequent encounter: Secondary | ICD-10-CM | POA: Diagnosis not present

## 2021-12-10 DIAGNOSIS — R6889 Other general symptoms and signs: Secondary | ICD-10-CM | POA: Diagnosis not present

## 2021-12-10 DIAGNOSIS — M6281 Muscle weakness (generalized): Secondary | ICD-10-CM | POA: Diagnosis not present

## 2021-12-10 DIAGNOSIS — Z23 Encounter for immunization: Secondary | ICD-10-CM | POA: Diagnosis not present

## 2021-12-10 DIAGNOSIS — S32414D Nondisplaced fracture of anterior wall of right acetabulum, subsequent encounter for fracture with routine healing: Secondary | ICD-10-CM | POA: Diagnosis not present

## 2021-12-10 DIAGNOSIS — W010XXA Fall on same level from slipping, tripping and stumbling without subsequent striking against object, initial encounter: Secondary | ICD-10-CM | POA: Diagnosis not present

## 2021-12-10 DIAGNOSIS — E7800 Pure hypercholesterolemia, unspecified: Secondary | ICD-10-CM | POA: Diagnosis not present

## 2021-12-10 DIAGNOSIS — J209 Acute bronchitis, unspecified: Secondary | ICD-10-CM | POA: Diagnosis not present

## 2021-12-10 DIAGNOSIS — Z9981 Dependence on supplemental oxygen: Secondary | ICD-10-CM | POA: Diagnosis not present

## 2021-12-10 DIAGNOSIS — S0181XA Laceration without foreign body of other part of head, initial encounter: Secondary | ICD-10-CM | POA: Diagnosis not present

## 2021-12-10 DIAGNOSIS — I10 Essential (primary) hypertension: Secondary | ICD-10-CM | POA: Diagnosis not present

## 2021-12-10 DIAGNOSIS — S3210XD Unspecified fracture of sacrum, subsequent encounter for fracture with routine healing: Secondary | ICD-10-CM | POA: Diagnosis not present

## 2021-12-10 DIAGNOSIS — S32591A Other specified fracture of right pubis, initial encounter for closed fracture: Secondary | ICD-10-CM | POA: Diagnosis not present

## 2021-12-10 DIAGNOSIS — S32401D Unspecified fracture of right acetabulum, subsequent encounter for fracture with routine healing: Secondary | ICD-10-CM | POA: Diagnosis not present

## 2021-12-10 DIAGNOSIS — J40 Bronchitis, not specified as acute or chronic: Secondary | ICD-10-CM | POA: Diagnosis not present

## 2021-12-10 DIAGNOSIS — W19XXXA Unspecified fall, initial encounter: Secondary | ICD-10-CM | POA: Diagnosis not present

## 2021-12-10 DIAGNOSIS — I129 Hypertensive chronic kidney disease with stage 1 through stage 4 chronic kidney disease, or unspecified chronic kidney disease: Secondary | ICD-10-CM | POA: Diagnosis not present

## 2021-12-10 DIAGNOSIS — S32592A Other specified fracture of left pubis, initial encounter for closed fracture: Secondary | ICD-10-CM | POA: Diagnosis not present

## 2021-12-10 DIAGNOSIS — Z720 Tobacco use: Secondary | ICD-10-CM | POA: Diagnosis not present

## 2021-12-10 DIAGNOSIS — K219 Gastro-esophageal reflux disease without esophagitis: Secondary | ICD-10-CM | POA: Diagnosis not present

## 2021-12-10 DIAGNOSIS — S32591D Other specified fracture of right pubis, subsequent encounter for fracture with routine healing: Secondary | ICD-10-CM | POA: Diagnosis not present

## 2021-12-10 DIAGNOSIS — J449 Chronic obstructive pulmonary disease, unspecified: Secondary | ICD-10-CM | POA: Diagnosis not present

## 2021-12-10 LAB — CBC
HCT: 24 % — ABNORMAL LOW (ref 36.0–46.0)
Hemoglobin: 8 g/dL — ABNORMAL LOW (ref 12.0–15.0)
MCH: 33.8 pg (ref 26.0–34.0)
MCHC: 33.3 g/dL (ref 30.0–36.0)
MCV: 101.3 fL — ABNORMAL HIGH (ref 80.0–100.0)
Platelets: 109 10*3/uL — ABNORMAL LOW (ref 150–400)
RBC: 2.37 MIL/uL — ABNORMAL LOW (ref 3.87–5.11)
RDW: 14.1 % (ref 11.5–15.5)
WBC: 6.5 10*3/uL (ref 4.0–10.5)
nRBC: 0 % (ref 0.0–0.2)

## 2021-12-10 LAB — BASIC METABOLIC PANEL
Anion gap: 7 (ref 5–15)
BUN: 39 mg/dL — ABNORMAL HIGH (ref 8–23)
CO2: 22 mmol/L (ref 22–32)
Calcium: 9.2 mg/dL (ref 8.9–10.3)
Chloride: 106 mmol/L (ref 98–111)
Creatinine, Ser: 1.42 mg/dL — ABNORMAL HIGH (ref 0.44–1.00)
GFR, Estimated: 37 mL/min — ABNORMAL LOW (ref >60)
Glucose, Bld: 89 mg/dL (ref 70–99)
Potassium: 5.1 mmol/L (ref 3.5–5.1)
Sodium: 135 mmol/L (ref 135–145)

## 2021-12-10 MED ORDER — OXYCODONE-ACETAMINOPHEN 5-325 MG PO TABS: 1.0000 | ORAL_TABLET | Freq: Four times a day (QID) | ORAL | 0 refills | Status: AC | PRN

## 2021-12-10 NOTE — Progress Notes (Signed)
PROGRESS NOTE    Bianca Horton  [id number] DOB: 07-Oct-1935 DOA: 12/06/2021 PCP: Perrin Maltese, MD  Assessment & Plan:   Principal Problem:   Intractable pain Active Problems:   Closed nondisplaced fracture of right acetabulum, unspecified portion of acetabulum, initial encounter ([place])   Inferior pubic ramus fracture-right   Essential hypertension   Chronic kidney disease, stage 3a ([place])   Fall at home, initial encounter   Hyperlipidemia   Hypothyroidism   Bronchitis   Tobacco abuse   Thrombocytopenia ([place])   AAA (abdominal aortic aneurysm) without rupture (HCC)   Closed nondisplaced fracture of right acetabulum (HCC)   Pelvic fracture (HCC)  Assessment and Plan:   Closed nondisplaced fracture of right acetabulum: partial weightbearing of RLE as per ortho surg. Percocet, morphine prn for pain    Inferior right pubic ramus fracture & right sacral ala fracture: percocet, morphine prn. Partial weightbearing of RLE as per ortho surg. F/u outpatient w/ ortho surg, Dr. Sharlet Salina, in 3-4 weeks    HTN: continue on coreg. Continue to hold ACE-I. IV hydralazine prn     CKDIIIa: Cr is trending down today. Avoid nephrotoxic meds    Fall: at home. CT head negative.  CT of C-spine negative for acute bony fracture, but showed degenerative disc disease.  OT/PT recs SNF. Waiting on SNF placement    HLD: continue on statin    Hypothyroidism: continue on levothyroxine   Possible bronchitis: completed abx course. Continue on bronchodilators & encourage incentive spirometry    Tobacco abuse: nicotine patch to prevent w/drawl. Received smoking cessation x 5 mins    Thrombocytopenia: etiology unclear. Labile   AAA: will need outpatient PCP f/u         DVT prophylaxis: lovenox Code Status: full  Family Communication:  Disposition Plan: medically stable but waiting on insurance auth still   Level of care: Telemetry Medical  Status is: Inpatient Remains inpatient appropriate  because: medically stable but waiting on insurance auth still     Consultants:  Ortho surg   Procedures:   Antimicrobials:   Subjective: Pt c/o fatigue   Objective: Vitals:   12/09/21 0820 12/09/21 1519 12/09/21 2008 12/10/21 0452  BP:  (!) 156/70 (!) 151/75 135/80  Pulse:  86 91 83  Resp: 16 16 16 16   Temp:  98.7 F (37.1 C) 97.8 F (36.6 C) (!) 97.5 F (36.4 C)  TempSrc:  Oral    SpO2:  96% 98% 98%  Weight:      Height:        Intake/Output Summary (Last 24 hours) at 12/10/2021 0748 Last data filed at 12/09/2021 1922 Gross per 24 hour  Intake 887.68 ml  Output 650 ml  Net 237.68 ml   Filed Weights   12/06/21 2300 12/07/21 0453  Weight: 56.2 kg 56.2 kg    Examination:  General exam: Appears comfortable. Respiratory system: clear breath sounds b/l  Cardiovascular system: S1/S2+. No rubs or clicks  Gastrointestinal system: abd is soft, NT, ND & normal bowel sounds  Central nervous system: alert and awake. Moves all extremities  Psychiatry: Judgement and insight appears at baseline. Flat mood and affect     Data Reviewed: I have personally reviewed following labs and imaging studies  CBC: Recent Labs  Lab 12/06/21 0543 12/07/21 0452 12/09/21 0448 12/10/21 0433  WBC 10.7* 7.4 7.0 6.5  NEUTROABS 8.2*  --   --   --   HGB 8.9* 8.3* 7.2* 8.0*  HCT 27.7* 25.6* 22.1* 24.0*  MCV 101.8* 101.6* 100.5* 101.3*  PLT 118* 101* 106* 099*   Basic Metabolic Panel: Recent Labs  Lab 12/06/21 0543 12/07/21 0452 12/09/21 0448 12/10/21 0433  NA 133* 135 131* 135  K 4.1 4.6 4.2 5.1  CL 102 105 106 106  CO2 24 26 22 22   GLUCOSE 114* 80 90 89  BUN 42* 46* 54* 39*  CREATININE 1.27* 1.31* 1.67* 1.42*  CALCIUM 9.2 9.3 8.3* 9.2   GFR: Estimated Creatinine Clearance: 24.8 mL/min (A) (by C-G formula based on SCr of 1.42 mg/dL (H)). Liver Function Tests: Recent Labs  Lab 12/06/21 0543  AST 21  ALT 14  ALKPHOS 65  BILITOT 0.6  PROT 5.5*  ALBUMIN 2.6*   No  results for input(s): "LIPASE", "AMYLASE" in the last 168 hours. No results for input(s): "AMMONIA" in the last 168 hours. Coagulation Profile: Recent Labs  Lab 12/06/21 0543  INR 1.1   Cardiac Enzymes: No results for input(s): "CKTOTAL", "CKMB", "CKMBINDEX", "TROPONINI" in the last 168 hours. BNP (last 3 results) No results for input(s): "PROBNP" in the last 8760 hours. HbA1C: No results for input(s): "HGBA1C" in the last 72 hours. CBG: No results for input(s): "GLUCAP" in the last 168 hours. Lipid Profile: No results for input(s): "CHOL", "HDL", "LDLCALC", "TRIG", "CHOLHDL", "LDLDIRECT" in the last 72 hours. Thyroid Function Tests: No results for input(s): "TSH", "T4TOTAL", "FREET4", "T3FREE", "THYROIDAB" in the last 72 hours. Anemia Panel: No results for input(s): "VITAMINB12", "FOLATE", "FERRITIN", "TIBC", "IRON", "RETICCTPCT" in the last 72 hours. Sepsis Labs: No results for input(s): "PROCALCITON", "LATICACIDVEN" in the last 168 hours.  Recent Results (from the past 240 hour(s))  SARS Coronavirus 2 by RT PCR (hospital order, performed in [facility] hospital lab) *cepheid single result test* Anterior Nasal Swab     Status: None   Collection Time: 12/06/21  6:07 AM   Specimen: Anterior Nasal Swab  Result Value Ref Range Status   SARS Coronavirus 2 by RT PCR NEGATIVE NEGATIVE Final    Comment: (NOTE) SARS-CoV-2 target nucleic acids are NOT DETECTED.  The SARS-CoV-2 RNA is generally detectable in upper and lower respiratory specimens during the acute phase of infection. The lowest concentration of SARS-CoV-2 viral copies this assay can detect is 250 copies / mL. A negative result does not preclude SARS-CoV-2 infection and should not be used as the sole basis for treatment or other patient management decisions.  A negative result may occur with improper specimen collection / handling, submission of specimen other than nasopharyngeal swab, presence of viral mutation(s)  within the areas targeted by this assay, and inadequate number of viral copies (<250 copies / mL). A negative result must be combined with clinical observations, patient history, and epidemiological information.  Fact Sheet for Patients:   https://www.patel.info/  Fact Sheet for Healthcare Providers: https://hall.com/  This test is not yet approved or  cleared by the [place] FDA and has been authorized for detection and/or diagnosis of SARS-CoV-2 by FDA under an Emergency Use Authorization (EUA).  This EUA will remain in effect (meaning this test can be used) for the duration of the COVID-19 declaration under Section 564(b)(1) of the Act, 21 U.S.C. section 360bbb-3(b)(1), unless the authorization is terminated or revoked sooner.  Performed at [facility] [address] [place], [place] [address]          Radiology Studies: No results found.      Scheduled Meds:  azithromycin  250 mg Oral Daily   buPROPion  150 mg  Oral Daily   carvedilol  25 mg Oral BID WC   cilostazol  100 mg Oral BID   enoxaparin (LOVENOX) injection  30 mg Subcutaneous [id number]   folic acid  1 mg Oral Daily   levothyroxine  125 mcg Oral q morning   lidocaine  1 patch Transdermal Q24H   nicotine  21 mg Transdermal Daily   simvastatin  40 mg Oral q1800   SM Double Antibiotic  1 Application Topical BID   Continuous Infusions:  sodium chloride 75 mL/hr at 12/10/21 0548     LOS: 2 days    Time spent: 25 mins     Wyvonnia Dusky, MD [facility] Pager [phone]  If 7PM-7AM, please contact night-coverage [email or web address] 12/10/2021, 7:48 AM '

## 2021-12-10 NOTE — Discharge Summary (Signed)
Physician Discharge Summary  TEAH ARMENT [id number] DOB: 01-16-1936 DOA: 12/06/2021  PCP: Perrin Maltese, MD  Admit date: 12/06/2021 Discharge date: 12/10/2021  Admitted From: home  Disposition:   SNF  Recommendations for Outpatient Follow-up:  Follow up with PCP in 1-2 weeks F/u w/ ortho surg, Dr. Sharlet Salina, in 3-4 weeks Partial weightbearing of RLE as per ortho surg   Home Health: no  Equipment/Devices:  Discharge Condition: stable  CODE STATUS: full  Diet recommendation: Heart Healthy   Brief/Interim Summary: HPI was taken from Dr. Blaine Hamper: Bianca Horton is a 86 y.o. female with medical history significant of hypertension, hyperlipidemia, AAA, hypothyroidism, depression, CKD-3a, tobacco abuse, who presents with fall and intractable pain.   Per report, pt had a witnessed mechanical fall while using walker to ambulate to the bathroom last night. No LOC. She developed pain in her buttock and right pelvis, which is constant, severe, intractable, nonradiating. Patient does not have chest pain, fever or chills.  Patient has mild dry cough.  No nausea, vomiting, diarrhea or abdominal pain.  No symptoms of UTI.  No facial droop or slurred speech. Present has laceration to right forehead.    Patient is not using oxygen normally.  Patient was found to have mild oxygen desaturation to 90% on room air initially, which improved to 96% on room air later on..  Chest x-ray showed possible bronchitis.    Data reviewed independently and ED Course: pt was found to have WBC 10.7, INR 1.1, PTT 27, negative COVID PCR, stable renal function, thrombocytopenia with platelet 118, temperature normal, blood pressure 139/67, heart rate 81, RR 15.  Chest x-ray showed possible bronchitis.  CT of head is negative for acute intracranial abnormalities.  CT of C-spine is negative for acute bony fracture, showed degenerative disc disease.  CT of pelvis that showed right acetabular fracture, right pubic ramus fracture,  right sacral ala fracture.  Patient is placed on MedSurg bed for observation, Dr. Sharlet Salina of Ortho is consulted.     EKG: I have personally reviewed.  Sinus rhythm, QTc 447, LAE, low voltage.   As per Dr. Jimmye Norman 10/6-10/9/23: Pt was found to have closed nondisplaced fracture of right acetabulum, inferior right pubic ramus fracture, & right sacral ala fracture after a fall at home. Ortho surg saw the pt and recommended pain control and partial weightbearing of RLE w/ PT/OT. PT/OT evaluated the pt and recommended SNF. Pt will need to f/u ortho surg, Dr. Sharlet Salina, in 3-4 weeks w/ repeat XRs of pelvis/right hip. For more information, please see previous progress/consult notes.   Discharge Diagnoses:  Principal Problem:   Intractable pain Active Problems:   Closed nondisplaced fracture of right acetabulum, unspecified portion of acetabulum, initial encounter ([place])   Inferior pubic ramus fracture-right   Essential hypertension   Chronic kidney disease, stage 3a (HCC)   Fall at home, initial encounter   Hyperlipidemia   Hypothyroidism   Bronchitis   Tobacco abuse   Thrombocytopenia (HCC)   AAA (abdominal aortic aneurysm) without rupture (HCC)   Closed nondisplaced fracture of right acetabulum (HCC)   Pelvic fracture (HCC)  Closed nondisplaced fracture of right acetabulum: partial weightbearing of RLE as per ortho surg. Percocet  prn for pain    Inferior right pubic ramus fracture & right sacral ala fracture: percocet  prn. Partial weightbearing of RLE as per ortho surg. F/u outpatient w/ ortho surg, Dr. Sharlet Salina, in 3-4 weeks    HTN: continue on coreg, ACE-I. IV hydralazine prn  CKDIIIa: Cr is trending down today. Avoid nephrotoxic meds    Fall: at home. CT head negative.  CT of C-spine negative for acute bony fracture, but showed degenerative disc disease.  OT/PT recs SNF.    HLD: continue on statin    Hypothyroidism: continue on levothyroxine    Possible bronchitis: completed  abx course. Continue on bronchodilators & encourage incentive spirometry    Tobacco abuse: nicotine patch to prevent w/drawl. Received smoking cessation x 5 mins    Thrombocytopenia: etiology unclear. Labile   AAA: will need outpatient PCP f/u   Discharge Instructions  Discharge Instructions     Diet - low sodium heart healthy   Complete by: As directed    Discharge instructions   Complete by: As directed    F/u w/ PCP in 1-2 weeks. F/u w/ ortho surg, Dr. Sharlet Salina, in 3-4 weeks. Partial weightbearing of RLE as per ortho surg   Discharge wound care:   Complete by: As directed       Wound care:  2 times daily      Comments: Apply antibiotic ointment to right forehead BID and cover with 2X2 and tape   Increase activity slowly   Complete by: As directed       Allergies as of 12/10/2021       Reactions   Clopidogrel    Other reaction(s): brusing skin   Sertraline Hcl Nausea And Vomiting   Other reaction(s): Nausea, Vomiting, Diarrhea, sleepwalking, Nausea, Vomiting, Diarrhea, sleepwalking        Medication List     TAKE these medications    acetaminophen 500 MG tablet Commonly known as: TYLENOL Take 1,000 mg by mouth 3 (three) times daily as needed for mild pain.   aspirin 81 MG chewable tablet Chew 325 mg by mouth daily.   buPROPion 150 MG 24 hr tablet Commonly known as: WELLBUTRIN XL   calcium carbonate 500 MG chewable tablet Commonly known as: TUMS - dosed in mg elemental calcium Chew 1 tablet by mouth 2 (two) times daily as needed for indigestion or heartburn.   carvedilol 25 MG tablet Commonly known as: COREG Take 25 mg by mouth 2 (two) times daily with a meal.   cilostazol 100 MG tablet Commonly known as: PLETAL Take 100 mg by mouth 2 (two) times daily.   enalapril 2.5 MG tablet Commonly known as: VASOTEC Take 2.5 mg by mouth daily.   escitalopram 10 MG tablet Commonly known as: LEXAPRO Take 10 mg by mouth daily.   folic acid 1 MG  tablet Commonly known as: FOLVITE Take 1 mg by mouth daily.   ICY HOT ADVANCED RELIEF EX Apply 1 Application topically as needed.   levothyroxine 125 MCG tablet Commonly known as: SYNTHROID Take 125 mcg by mouth every morning.   oxyCODONE-acetaminophen 5-325 MG tablet Commonly known as: PERCOCET/ROXICET Take 1 tablet by mouth every 6 (six) hours as needed for up to 1 day for moderate pain or severe pain.   PreserVision/Lutein Caps Take by mouth.   simvastatin 40 MG tablet Commonly known as: [place]               Discharge Care Instructions  (From admission, onward)           Start     Ordered   12/10/21 0000  Discharge wound care:       Comments:    Wound care:  2 times daily      Comments: Apply antibiotic ointment to right forehead BID  and cover with 2X2 and tape   12/10/21 1400            Allergies  Allergen Reactions   Clopidogrel     Other reaction(s): brusing skin   Sertraline Hcl Nausea And Vomiting    Other reaction(s): Nausea, Vomiting, Diarrhea, sleepwalking, Nausea, Vomiting, Diarrhea, sleepwalking    Consultations: Ortho surg    Procedures/Studies: DG Chest Portable 1 View  Result Date: 12/06/2021 CLINICAL DATA:  86 year old female with history of shortness of breath and hypoxia. EXAM: PORTABLE CHEST 1 VIEW COMPARISON:  Chest x-ray 12/20/2009. FINDINGS: Lung volumes are low. Diffuse interstitial prominence and peribronchial cuffing. No consolidative airspace disease. No pleural effusions. No pneumothorax. No pulmonary nodule or mass noted. Pulmonary vasculature and the cardiomediastinal silhouette are within normal limits. Atherosclerotic calcifications are noted in the thoracic aorta. IMPRESSION: 1. Diffuse interstitial prominence and peribronchial cuffing concerning for an acute bronchitis. 2. Aortic atherosclerosis. Electronically Signed   By: Vinnie Langton M.D.   On: 12/06/2021 05:53   CT PELVIS WO CONTRAST  Result Date:  12/06/2021 CLINICAL DATA:  Hip trauma. Fracture suspected. Sacral pain following a fall. EXAM: CT PELVIS WITHOUT CONTRAST TECHNIQUE: Multidetector CT imaging of the pelvis was performed following the standard protocol without intravenous contrast. RADIATION DOSE REDUCTION: This exam was performed according to the departmental dose-optimization program which includes automated exposure control, adjustment of the mA and/or kV according to patient size and/or use of iterative reconstruction technique. COMPARISON:  CT without contrast 12/16/2009 FINDINGS: Urinary Tract: The pelvic ureters are decompressed and free of calcifications. The bladder is nondistended but could be thickened. Bowel:  Unremarkable visualized pelvic bowel loops. Vascular/Lymphatic: The distal abdominal aorta, iliac and proximal femoral arteries are heavily calcified and increasingly calcified since 12 years ago. There previously was a 3 cm AAA, which today measures at least 3.3 cm but it is not fully included on this exam and should be fully imaged under ultrasound to assess its full size. No iliac aneurysm is seen. The degree of calcification likely indicates flow-limiting iliofemoral stenoses bilaterally. There are no enlarged inguinopelvic nodes. Reproductive:  The uterus is absent.  The ovaries are not enlarged. Other: There is a small pelvic hemoperitoneum anterior to the bladder, measuring 5.3 x 2.7 by 4.2 cm. There is trace ascites in the posterior deep pelvis. Small but wide mouth umbilical fat hernia is present. There are tiny inguinal fat hernias. There is no incarcerated hernia. Musculoskeletal: Generalized osteopenia. There are 2 tapered bands of sclerosis extending longitudinally from the top of the femoral heads to the femoral head and neck junctions symmetrically. This is probably preserved bone mineralization surrounded by osteopenia and was present 12 years ago and not significantly different in appearance. Component of  osteonecrosis however, is possible, but there has been no interval overlying articular surface subsidence on either side. There is an acute fracture with anterior comminution involving the anterior column of the right acetabulum and a small right hip joint hemarthrosis, with nondisplaced fracture of the right pubic bone and the posterior aspect of the right inferior pubic ramus and with healed fracture deformities of the left anterior acetabulum and inferior pubic ramus. There is an acute transverse fracture through the S4 segment through the ventral body portion as well as the dorsal roof, nondisplaced except for slight cortical buckling of the ventral S4 body. There is additional nondisplaced linear fracture anteriorly of the right ala of the sacrum alongside the SI joint. There is no proximal femoral or further sacral fracture.  There is mild symmetric hip DJD, spurring of the SI joints, lumbar hypertrophic facet arthropathy at L4-5 and L5-S1 and spurring and vacuum phenomenon of the SI joints. IMPRESSION: 1. osteopenia with intra-articular transverse fracture of the anterior column of right acetabulum, with nondisplaced comminution anteriorly and a small hemarthrosis of the right hip joint. 2. Nondisplaced fracture through the right pubic bone and posterior aspect of the right inferior pubic ramus. 3. Nondisplaced fracture through the ventral and dorsal S4 level and intra-articular fracture of the anterior right sacral ala, also nondisplaced. 4. Small hemoperitoneum anterior to the bladder. Trace posterior deep pelvic fluid. 5. Degenerative changes. 6. Cystitis versus bladder nondistention. 7. Heavy vascular calcification with partially visible AAA at least 3.3 cm. Outpatient nonemergent ultrasound recommended to assess the full extent of the aneurysm. Previously this was 3 cm. 8. Shallow but wide mouth umbilical fat hernia. Electronically Signed   By: Telford Nab M.D.   On: 12/06/2021 05:11   DG  Sacrum/Coccyx  Result Date: 12/06/2021 CLINICAL DATA:  Fall, tailbone pain EXAM: SACRUM AND COCCYX - 2+ VIEW COMPARISON:  None Available. FINDINGS: There is no evidence of fracture or other focal bone lesions. No displaced sacrococcygeal fracture. IMPRESSION: Negative. Electronically Signed   By: Julian Hy M.D.   On: 12/06/2021 03:22   DG Pelvis Portable  Result Date: 12/06/2021 CLINICAL DATA:  Fall, tailbone pain EXAM: PORTABLE PELVIS 1-2 VIEWS COMPARISON:  None Available. FINDINGS: No fracture or dislocation is seen. Bilateral hip joint spaces are preserved. Visualized bony pelvis appears intact. Old left superior and inferior pubic rami fractures. Vascular calcifications. IMPRESSION: Negative. Electronically Signed   By: Julian Hy M.D.   On: 12/06/2021 03:21   CT HEAD WO CONTRAST (5MM)  Result Date: 12/06/2021 CLINICAL DATA:  Fall, forehead laceration EXAM: CT HEAD WITHOUT CONTRAST CT CERVICAL SPINE WITHOUT CONTRAST TECHNIQUE: Multidetector CT imaging of the head and cervical spine was performed following the standard protocol without intravenous contrast. Multiplanar CT image reconstructions of the cervical spine were also generated. RADIATION DOSE REDUCTION: This exam was performed according to the departmental dose-optimization program which includes automated exposure control, adjustment of the mA and/or kV according to patient size and/or use of iterative reconstruction technique. COMPARISON:  None Available. FINDINGS: CT HEAD FINDINGS Brain: No evidence of acute infarction, hemorrhage, hydrocephalus, extra-axial collection or mass lesion/mass effect. Subcortical white matter and periventricular small vessel ischemic changes. Vascular: No hyperdense vessel or unexpected calcification. Skull: Normal. Negative for fracture or focal lesion. Sinuses/Orbits: The visualized paranasal sinuses are essentially clear. The mastoid air cells are unopacified. Other: None. CT CERVICAL SPINE  FINDINGS Alignment: Normal cervical lordosis. Skull base and vertebrae: No acute fracture. No primary bone lesion or focal pathologic process. Soft tissues and spinal canal: No prevertebral fluid or swelling. No visible canal hematoma. Disc levels: Mild degenerative changes of the mid cervical spine. Spinal canal is patent. Upper chest: Visualized lung apices are clear. Other: None. IMPRESSION: No evidence of acute intracranial abnormality. Small vessel ischemic changes. No traumatic injury to the cervical spine. Mild degenerative changes. Electronically Signed   By: Julian Hy M.D.   On: 12/06/2021 03:20   CT Cervical Spine Wo Contrast  Result Date: 12/06/2021 CLINICAL DATA:  Fall, forehead laceration EXAM: CT HEAD WITHOUT CONTRAST CT CERVICAL SPINE WITHOUT CONTRAST TECHNIQUE: Multidetector CT imaging of the head and cervical spine was performed following the standard protocol without intravenous contrast. Multiplanar CT image reconstructions of the cervical spine were also generated. RADIATION DOSE REDUCTION: This  exam was performed according to the departmental dose-optimization program which includes automated exposure control, adjustment of the mA and/or kV according to patient size and/or use of iterative reconstruction technique. COMPARISON:  None Available. FINDINGS: CT HEAD FINDINGS Brain: No evidence of acute infarction, hemorrhage, hydrocephalus, extra-axial collection or mass lesion/mass effect. Subcortical white matter and periventricular small vessel ischemic changes. Vascular: No hyperdense vessel or unexpected calcification. Skull: Normal. Negative for fracture or focal lesion. Sinuses/Orbits: The visualized paranasal sinuses are essentially clear. The mastoid air cells are unopacified. Other: None. CT CERVICAL SPINE FINDINGS Alignment: Normal cervical lordosis. Skull base and vertebrae: No acute fracture. No primary bone lesion or focal pathologic process. Soft tissues and spinal canal:  No prevertebral fluid or swelling. No visible canal hematoma. Disc levels: Mild degenerative changes of the mid cervical spine. Spinal canal is patent. Upper chest: Visualized lung apices are clear. Other: None. IMPRESSION: No evidence of acute intracranial abnormality. Small vessel ischemic changes. No traumatic injury to the cervical spine. Mild degenerative changes. Electronically Signed   By: Julian Hy M.D.   On: 12/06/2021 03:20   (Echo, Carotid, EGD, Colonoscopy, ERCP)    Subjective: Pt c/o fatigue   Discharge Exam: Vitals:   12/10/21 0452 12/10/21 0754  BP: 135/80 (!) 144/82  Pulse: 83 85  Resp: 16 16  Temp: (!) 97.5 F (36.4 C) 98.1 F (36.7 C)  SpO2: 98% 98%   Vitals:   12/09/21 1519 12/09/21 2008 12/10/21 0452 12/10/21 0754  BP: (!) 156/70 (!) 151/75 135/80 (!) 144/82  Pulse: 86 91 83 85  Resp: 16 16 16 16   Temp: 98.7 F (37.1 C) 97.8 F (36.6 C) (!) 97.5 F (36.4 C) 98.1 F (36.7 C)  TempSrc: Oral   Oral  SpO2: 96% 98% 98% 98%  Weight:      Height:        General: Pt is alert, awake, not in acute distress Cardiovascular: S1/S2 +, no rubs, no gallops Respiratory: CTA bilaterally, no wheezing, no rhonchi Abdominal: Soft, NT, ND, bowel sounds + Extremities: no edema, no cyanosis    The results of significant diagnostics from this hospitalization (including imaging, microbiology, ancillary and laboratory) are listed below for reference.     Microbiology: Recent Results (from the past 240 hour(s))  SARS Coronavirus 2 by RT PCR (hospital order, performed in [facility] hospital lab) *cepheid single result test* Anterior Nasal Swab     Status: None   Collection Time: 12/06/21  6:07 AM   Specimen: Anterior Nasal Swab  Result Value Ref Range Status   SARS Coronavirus 2 by RT PCR NEGATIVE NEGATIVE Final    Comment: (NOTE) SARS-CoV-2 target nucleic acids are NOT DETECTED.  The SARS-CoV-2 RNA is generally detectable in upper and lower respiratory  specimens during the acute phase of infection. The lowest concentration of SARS-CoV-2 viral copies this assay can detect is 250 copies / mL. A negative result does not preclude SARS-CoV-2 infection and should not be used as the sole basis for treatment or other patient management decisions.  A negative result may occur with improper specimen collection / handling, submission of specimen other than nasopharyngeal swab, presence of viral mutation(s) within the areas targeted by this assay, and inadequate number of viral copies (<250 copies / mL). A negative result must be combined with clinical observations, patient history, and epidemiological information.  Fact Sheet for Patients:   https://www.patel.info/  Fact Sheet for Healthcare Providers: https://hall.com/  This test is not yet approved or  cleared  by the [place] and has been authorized for detection and/or diagnosis of SARS-CoV-2 by FDA under an Emergency Use Authorization (EUA).  This EUA will remain in effect (meaning this test can be used) for the duration of the COVID-19 declaration under Section 564(b)(1) of the Act, 21 U.S.C. section 360bbb-3(b)(1), unless the authorization is terminated or revoked sooner.  Performed at [facility], [address] [place], [place] [address]      Labs: BNP (last 3 results) Recent Labs    12/06/21 0543  BNP 0000000*   Basic Metabolic Panel: Recent Labs  Lab 12/06/21 0543 12/07/21 0452 12/09/21 0448 12/10/21 0433  NA 133* 135 131* 135  K 4.1 4.6 4.2 5.1  CL 102 105 106 106  CO2 24 26 22 22   GLUCOSE 114* 80 90 89  BUN 42* 46* 54* 39*  CREATININE 1.27* 1.31* 1.67* 1.42*  CALCIUM 9.2 9.3 8.3* 9.2   Liver Function Tests: Recent Labs  Lab 12/06/21 0543  AST 21  ALT 14  ALKPHOS 65  BILITOT 0.6  PROT 5.5*  ALBUMIN 2.6*   No results for input(s): "LIPASE", "AMYLASE" in the last 168 hours. No results for  input(s): "AMMONIA" in the last 168 hours. CBC: Recent Labs  Lab 12/06/21 0543 12/07/21 0452 12/09/21 0448 12/10/21 0433  WBC 10.7* 7.4 7.0 6.5  NEUTROABS 8.2*  --   --   --   HGB 8.9* 8.3* 7.2* 8.0*  HCT 27.7* 25.6* 22.1* 24.0*  MCV 101.8* 101.6* 100.5* 101.3*  PLT 118* 101* 106* 109*   Cardiac Enzymes: No results for input(s): "CKTOTAL", "CKMB", "CKMBINDEX", "TROPONINI" in the last 168 hours. BNP: Invalid input(s): "POCBNP" CBG: No results for input(s): "GLUCAP" in the last 168 hours. D-Dimer No results for input(s): "DDIMER" in the last 72 hours. Hgb A1c No results for input(s): "HGBA1C" in the last 72 hours. Lipid Profile No results for input(s): "CHOL", "HDL", "LDLCALC", "TRIG", "CHOLHDL", "LDLDIRECT" in the last 72 hours. Thyroid function studies No results for input(s): "TSH", "T4TOTAL", "T3FREE", "THYROIDAB" in the last 72 hours.  Invalid input(s): "FREET3" Anemia work up No results for input(s): "VITAMINB12", "FOLATE", "FERRITIN", "TIBC", "IRON", "RETICCTPCT" in the last 72 hours. Urinalysis No results found for: "COLORURINE", "APPEARANCEUR", "LABSPEC", "PHURINE", "GLUCOSEU", "HGBUR", "BILIRUBINUR", "KETONESUR", "PROTEINUR", "UROBILINOGEN", "NITRITE", "LEUKOCYTESUR" Sepsis Labs Recent Labs  Lab 12/06/21 0543 12/07/21 0452 12/09/21 0448 12/10/21 0433  WBC 10.7* 7.4 7.0 6.5   Microbiology Recent Results (from the past 240 hour(s))  SARS Coronavirus 2 by RT PCR (hospital order, performed in [facility] hospital lab) *cepheid single result test* Anterior Nasal Swab     Status: None   Collection Time: 12/06/21  6:07 AM   Specimen: Anterior Nasal Swab  Result Value Ref Range Status   SARS Coronavirus 2 by RT PCR NEGATIVE NEGATIVE Final    Comment: (NOTE) SARS-CoV-2 target nucleic acids are NOT DETECTED.  The SARS-CoV-2 RNA is generally detectable in upper and lower respiratory specimens during the acute phase of infection. The lowest concentration of  SARS-CoV-2 viral copies this assay can detect is 250 copies / mL. A negative result does not preclude SARS-CoV-2 infection and should not be used as the sole basis for treatment or other patient management decisions.  A negative result may occur with improper specimen collection / handling, submission of specimen other than nasopharyngeal swab, presence of viral mutation(s) within the areas targeted by this assay, and inadequate number of viral copies (<250 copies / mL). A negative result must be  combined with clinical observations, patient history, and epidemiological information.  Fact Sheet for Patients:   https://www.patel.info/  Fact Sheet for Healthcare Providers: https://hall.com/  This test is not yet approved or  cleared by the [place] FDA and has been authorized for detection and/or diagnosis of SARS-CoV-2 by FDA under an Emergency Use Authorization (EUA).  This EUA will remain in effect (meaning this test can be used) for the duration of the COVID-19 declaration under Section 564(b)(1) of the Act, 21 U.S.C. section 360bbb-3(b)(1), unless the authorization is terminated or revoked sooner.  Performed at [facility], [address] [place], [place] [address]      Time coordinating discharge: Over 30 minutes  SIGNED:   Wyvonnia Dusky, MD  [facility] 12/10/2021, 2:00 PM Pager   If 7PM-7AM, please contact night-coverage [email or web address]

## 2021-12-10 NOTE — TOC Transition Note (Signed)
Transition of Care [facility]) - CM/SW Discharge Note   Patient Details  Name: Bianca Horton MRN: [id number] Date of Birth: 1935/08/15  [facility] CM/SW Contact:  Beverly Sessions, RN Phone Number: 12/10/2021, 2:23 PM   Clinical Narrative:      Patient will DC FM:[id number] room 505  Anticipated DC date:12/10/21  Family notified:Niece Transport by: Johnanna Schneiders  Per MD patient ready for DC to . RN, patient, patient's family, and facility notified of DC. Discharge Summary sent to facility. RN given number for report. DC packet on chart. Ambulance transport requested for patient.  TOC signing off.  Isaias Cowman [facility] [phone]    Barriers to Discharge: Continued Medical Work up, Ship broker, SNF Pending bed offer   Patient Goals and CMS Choice Patient states their goals for this hospitalization and ongoing recovery are:: To get stronger CMS Medicare.gov Compare Post Acute Care list provided to:: Patient    Discharge Placement                       Discharge Plan and Services     Post Acute Care Choice: Skilled Nursing Facility                               Social Determinants of Health (SDOH) Interventions Food Insecurity Interventions: Intervention Not Indicated Housing Interventions: Intervention Not Indicated Transportation Interventions: Intervention Not Indicated Utilities Interventions: Intervention Not Indicated   Readmission Risk Interventions     No data to display

## 2021-12-10 NOTE — Progress Notes (Signed)
Occupational Therapy Treatment Patient Details Name: Bianca Horton MRN: [id number] DOB: [birth year] Today's Date: 12/10/2021   History of present illness Patient is an 86 year old female who presents after mechanical fall while using walker to ambulate to bathroom at night. Patient found to have R acetabular fx, R pubic ramus fracture, R sacral ala fracture. Per Dr. Sharlet Salina patient is PWB RLE. PMh includes HTN, HLD, AAA, hypothyroidism, depression, CKD stage III, tobacco abuse.   OT comments  Pt seen for OT tx. Pt endorsing discomfort but unable to use 10pt scale to rate. With encouragement pt agreeable to bed mobility and ADL transfers, requiring MOD A for sup<>sit and STS with RW, and MAX A for lateral/scoot transfers with MAX VC for sequencing. Pt educated in [place], [place], and provided skilled cueing throughout session to promote mobility and minimize pain. Pt verbalized understanding. Pt returned to bed, RN notified of pain. Pt continues to benefit from skilled OT services. Continue to recommend SNF.    Recommendations for follow up therapy are one component of a multi-disciplinary discharge planning process, led by the attending physician.  Recommendations may be updated based on patient status, additional functional criteria and insurance authorization.    Follow Up Recommendations  Skilled nursing-short term rehab (<3 hours/day)    Assistance Recommended at Discharge Frequent or constant Supervision/Assistance  Patient can return home with the following  A lot of help with walking and/or transfers;A lot of help with bathing/dressing/bathroom;Assistance with cooking/housework;Assist for transportation;Help with stairs or ramp for entrance;Direct supervision/assist for medications management   Equipment Recommendations  Other (comment) (defer to next venue)    Recommendations for Other Services      Precautions / Restrictions Precautions Precautions: Fall Restrictions Weight  Bearing Restrictions: Yes RLE Weight Bearing: Partial weight bearing RLE Partial Weight Bearing Percentage or Pounds: 25-50%       Mobility Bed Mobility Overal bed mobility: Needs Assistance Bed Mobility: Supine to Sit, Sit to Supine     Supine to sit: Mod assist, HOB elevated Sit to supine: Mod assist, Max assist        Transfers Overall transfer level: Needs assistance Equipment used: Rolling walker (2 wheels) Transfers: Sit to/from Stand, Bed to chair/wheelchair/BSC Sit to Stand: Mod assist, From elevated surface          Lateral/Scoot Transfers: Max assist General transfer comment: Pt tolerated standing approx 30 sec for linens change before requiring to sit; VC for sequencing for lateral scoot transfers ultimately requiring MAX A to complete     Balance Overall balance assessment: History of Falls, Needs assistance Sitting-balance support: Single extremity supported, Bilateral upper extremity supported Sitting balance-Leahy Scale: Fair     Standing balance support: Bilateral upper extremity supported, Reliant on assistive device for balance Standing balance-Leahy Scale: Poor                             ADL either performed or assessed with clinical judgement   ADL Overall ADL's : Needs assistance/impaired                                     Functional mobility during ADLs: Minimal assistance;Moderate assistance;Rolling walker (2 wheels)      Extremity/Trunk Assessment              Vision       Perception  Praxis      Cognition Arousal/Alertness: Awake/alert Behavior During Therapy: WFL for tasks assessed/performed Overall Cognitive Status: Difficult to assess                                 General Comments: HOH, requires VC        Exercises Other Exercises Other Exercises: Pt educated in [place], [place], and provided skilled cueing throughout session to promote mobility and minimize pain     Shoulder Instructions       General Comments      Pertinent Vitals/ Pain       Pain Assessment Pain Assessment: Faces Faces Pain Scale: Hurts even more Pain Location: facial grimacing and endorses pain with hip abduction and standing Pain Descriptors / Indicators: Guarding, Grimacing Pain Intervention(s): Limited activity within patient's tolerance, Monitored during session, Repositioned, Patient requesting pain meds-RN notified  Home Living                                          Prior Functioning/Environment              Frequency  Min 2X/week        Progress Toward Goals  OT Goals(current goals can now be found in the care plan section)  Progress towards OT goals: Progressing toward goals  Acute Rehab OT Goals Patient Stated Goal: have less pain and get better OT Goal Formulation: With patient Time For Goal Achievement: 12/21/21 Potential to Achieve Goals: Good  Plan Discharge plan remains appropriate;Frequency remains appropriate    Co-evaluation                 AM-PAC OT "6 Clicks" Daily Activity     Outcome Measure   Help from another person eating meals?: None Help from another person taking care of personal grooming?: A Little Help from another person toileting, which includes using toliet, bedpan, or urinal?: A Lot Help from another person bathing (including washing, rinsing, drying)?: A Lot Help from another person to put on and taking off regular upper body clothing?: A Lot Help from another person to put on and taking off regular lower body clothing?: A Lot 6 Click Score: 15    End of Session Equipment Utilized During Treatment: Gait belt;Rolling walker (2 wheels)  OT Visit Diagnosis: Other abnormalities of gait and mobility (R26.89);Muscle weakness (generalized) (M62.81);History of falling (Z91.81);Pain Pain - Right/Left: Right Pain - part of body: Hip (and pelvis)   Activity Tolerance Patient limited by pain    Patient Left in bed;with call bell/phone within reach;with bed alarm set   Nurse Communication          Time: 1350-1406 OT Time Calculation (min): 16 min  Charges: OT General Charges $OT Visit: 1 Visit  Ardeth Perfect., MPH, MS, OTR/L ascom [phone] 12/10/21, 2:44 PM

## 2021-12-10 NOTE — Progress Notes (Signed)
Physical Therapy Treatment Patient Details Name: Bianca Horton MRN: [id number] DOB: September 17, 1935 Today's Date: 12/10/2021   History of Present Illness Patient is an 86 year old female who presents after mechanical fall while using walker to ambulate to bathroom at night. Patient found to have R acetabular fx, R pubic ramus fracture, R sacral ala fracture. Per Dr. Sharlet Salina patient is PWB RLE. PMh includes HTN, HLD, AAA, hypothyroidism, depression, CKD stage III, tobacco abuse.    PT Comments    Pt in bed.  Feeling ok.  She is assisted to EOB with mod a x 1.  Pt needs heavy cues and hands on assist to place hands on rails and encouragement to assist.  Once sitting she is generally steady with no LOB.  She is able to stand x 2 with mod a x 1 for up to 20 seconds each which is improved over yesterday.  When cued to attempt lateral step along bed she quickly sits due to pain.  Unable to safely transfer to recliner at bedside.  She returns to supine with max a x 1 and is repositioned in bed before supine ex.   Recommendations for follow up therapy are one component of a multi-disciplinary discharge planning process, led by the attending physician.  Recommendations may be updated based on patient status, additional functional criteria and insurance authorization.  Follow Up Recommendations  Skilled nursing-short term rehab (<3 hours/day)     Assistance Recommended at Discharge    Patient can return home with the following A lot of help with walking and/or transfers;A lot of help with bathing/dressing/bathroom;Assistance with cooking/housework;Assist for transportation;Help with stairs or ramp for entrance;Other (comment)   Equipment Recommendations       Recommendations for Other Services       Precautions / Restrictions Precautions Precautions: Fall Restrictions Weight Bearing Restrictions: Yes RLE Weight Bearing: Partial weight bearing RLE Partial Weight Bearing Percentage or Pounds:  25-50%     Mobility  Bed Mobility Overal bed mobility: Needs Assistance Bed Mobility: Supine to Sit, Sit to Supine     Supine to sit: Mod assist Sit to supine: Max assist   General bed mobility comments: improved today but she does not really assist without cues.  keeps arms crossed over trunk and does not try to physically help without direct hands on assist to place hands on rail    Transfers Overall transfer level: Needs assistance Equipment used: Rolling walker (2 wheels) Transfers: Sit to/from Stand Sit to Stand: Mod assist, From elevated surface           General transfer comment: she is able to stand fully today x 2 for about 20 seconds before sitting.  encouraged trying to laterally step but quickly sits due to pain.    Ambulation/Gait               General Gait Details: patient refuse ambulation   Stairs             Wheelchair Mobility    Modified Rankin (Stroke Patients Only)       Balance Overall balance assessment: History of Falls, Needs assistance Sitting-balance support: Single extremity supported, Bilateral upper extremity supported Sitting balance-Leahy Scale: Fair     Standing balance support: Bilateral upper extremity supported, Reliant on assistive device for balance Standing balance-Leahy Scale: Poor Standing balance comment: pain limited  Cognition Arousal/Alertness: Awake/alert Behavior During Therapy: WFL for tasks assessed/performed Overall Cognitive Status: Within Functional Limits for tasks assessed                                          Exercises Other Exercises Other Exercises: supine AAROM limited R by pain.  seated LAQ and ankle pumsp.  encouragement to assist.    General Comments        Pertinent Vitals/Pain Pain Assessment Pain Assessment: Faces Faces Pain Scale: Hurts even more Pain Location: facial grimmacing and some moaning especially with  abduction Pain Descriptors / Indicators: Sore, Moaning Pain Intervention(s): Limited activity within patient's tolerance, Monitored during session, Repositioned    Home Living                          Prior Function            PT Goals (current goals can now be found in the care plan section) Progress towards PT goals: Progressing toward goals    Frequency    7X/week      PT Plan Current plan remains appropriate    Co-evaluation              AM-PAC PT "6 Clicks" Mobility   Outcome Measure  Help needed turning from your back to your side while in a flat bed without using bedrails?: A Lot Help needed moving from lying on your back to sitting on the side of a flat bed without using bedrails?: A Lot Help needed moving to and from a bed to a chair (including a wheelchair)?: Total Help needed standing up from a chair using your arms (e.g., wheelchair or bedside chair)?: A Lot Help needed to walk in hospital room?: Total Help needed climbing 3-5 steps with a railing? : Total 6 Click Score: 9    End of Session Equipment Utilized During Treatment: Gait belt Activity Tolerance: Patient limited by pain Patient left: in bed;with call bell/phone within reach;with nursing/sitter in room Nurse Communication: Mobility status;Weight bearing status PT Visit Diagnosis: Unsteadiness on feet (R26.81);Other abnormalities of gait and mobility (R26.89);Muscle weakness (generalized) (M62.81);History of falling (Z91.81);Difficulty in walking, not elsewhere classified (R26.2);Pain Pain - Right/Left: Right Pain - part of body: Hip     Time: 0915-0928 PT Time Calculation (min) (ACUTE ONLY): 13 min  Charges:  $Therapeutic Exercise: 8-22 mins                   Chesley Noon, PTA 12/10/21, 10:03 AM

## 2021-12-10 NOTE — Progress Notes (Signed)
Report called to [facility], receiving nurse at [facility]. Patient awaiting [facility] pick-up.

## 2021-12-10 NOTE — Progress Notes (Signed)
Patient D/C to [facility]. EMS arrived to transport patient. IV was removed earlier in shift. Niece Deon Pilling was called and notified that was being transported.

## 2021-12-14 DIAGNOSIS — D6959 Other secondary thrombocytopenia: Secondary | ICD-10-CM | POA: Diagnosis not present

## 2021-12-14 DIAGNOSIS — M6281 Muscle weakness (generalized): Secondary | ICD-10-CM | POA: Diagnosis not present

## 2021-12-14 DIAGNOSIS — J449 Chronic obstructive pulmonary disease, unspecified: Secondary | ICD-10-CM | POA: Diagnosis not present

## 2021-12-14 DIAGNOSIS — I10 Essential (primary) hypertension: Secondary | ICD-10-CM | POA: Diagnosis not present

## 2021-12-14 DIAGNOSIS — E038 Other specified hypothyroidism: Secondary | ICD-10-CM | POA: Diagnosis not present

## 2021-12-14 DIAGNOSIS — S32414D Nondisplaced fracture of anterior wall of right acetabulum, subsequent encounter for fracture with routine healing: Secondary | ICD-10-CM | POA: Diagnosis not present

## 2021-12-14 DIAGNOSIS — K219 Gastro-esophageal reflux disease without esophagitis: Secondary | ICD-10-CM | POA: Diagnosis not present

## 2021-12-14 DIAGNOSIS — S32810D Multiple fractures of pelvis with stable disruption of pelvic ring, subsequent encounter for fracture with routine healing: Secondary | ICD-10-CM | POA: Diagnosis not present

## 2021-12-14 DIAGNOSIS — E782 Mixed hyperlipidemia: Secondary | ICD-10-CM | POA: Diagnosis not present

## 2021-12-17 DIAGNOSIS — I10 Essential (primary) hypertension: Secondary | ICD-10-CM | POA: Diagnosis not present

## 2021-12-17 DIAGNOSIS — S32414D Nondisplaced fracture of anterior wall of right acetabulum, subsequent encounter for fracture with routine healing: Secondary | ICD-10-CM | POA: Diagnosis not present

## 2021-12-17 DIAGNOSIS — M6281 Muscle weakness (generalized): Secondary | ICD-10-CM | POA: Diagnosis not present

## 2021-12-17 DIAGNOSIS — S32810D Multiple fractures of pelvis with stable disruption of pelvic ring, subsequent encounter for fracture with routine healing: Secondary | ICD-10-CM | POA: Diagnosis not present

## 2021-12-17 DIAGNOSIS — E782 Mixed hyperlipidemia: Secondary | ICD-10-CM | POA: Diagnosis not present

## 2021-12-17 DIAGNOSIS — K219 Gastro-esophageal reflux disease without esophagitis: Secondary | ICD-10-CM | POA: Diagnosis not present

## 2021-12-17 DIAGNOSIS — J449 Chronic obstructive pulmonary disease, unspecified: Secondary | ICD-10-CM | POA: Diagnosis not present

## 2021-12-17 DIAGNOSIS — E038 Other specified hypothyroidism: Secondary | ICD-10-CM | POA: Diagnosis not present

## 2021-12-17 DIAGNOSIS — D6959 Other secondary thrombocytopenia: Secondary | ICD-10-CM | POA: Diagnosis not present

## 2021-12-18 DIAGNOSIS — I10 Essential (primary) hypertension: Secondary | ICD-10-CM | POA: Diagnosis not present

## 2021-12-18 DIAGNOSIS — S32810D Multiple fractures of pelvis with stable disruption of pelvic ring, subsequent encounter for fracture with routine healing: Secondary | ICD-10-CM | POA: Diagnosis not present

## 2021-12-18 DIAGNOSIS — S32414D Nondisplaced fracture of anterior wall of right acetabulum, subsequent encounter for fracture with routine healing: Secondary | ICD-10-CM | POA: Diagnosis not present

## 2021-12-18 DIAGNOSIS — E782 Mixed hyperlipidemia: Secondary | ICD-10-CM | POA: Diagnosis not present

## 2021-12-18 DIAGNOSIS — E038 Other specified hypothyroidism: Secondary | ICD-10-CM | POA: Diagnosis not present

## 2021-12-18 DIAGNOSIS — M6281 Muscle weakness (generalized): Secondary | ICD-10-CM | POA: Diagnosis not present

## 2021-12-18 DIAGNOSIS — D6959 Other secondary thrombocytopenia: Secondary | ICD-10-CM | POA: Diagnosis not present

## 2021-12-18 DIAGNOSIS — J449 Chronic obstructive pulmonary disease, unspecified: Secondary | ICD-10-CM | POA: Diagnosis not present

## 2021-12-18 DIAGNOSIS — K219 Gastro-esophageal reflux disease without esophagitis: Secondary | ICD-10-CM | POA: Diagnosis not present

## 2021-12-21 DIAGNOSIS — D6959 Other secondary thrombocytopenia: Secondary | ICD-10-CM | POA: Diagnosis not present

## 2021-12-21 DIAGNOSIS — S32810D Multiple fractures of pelvis with stable disruption of pelvic ring, subsequent encounter for fracture with routine healing: Secondary | ICD-10-CM | POA: Diagnosis not present

## 2021-12-21 DIAGNOSIS — E782 Mixed hyperlipidemia: Secondary | ICD-10-CM | POA: Diagnosis not present

## 2021-12-21 DIAGNOSIS — S32414D Nondisplaced fracture of anterior wall of right acetabulum, subsequent encounter for fracture with routine healing: Secondary | ICD-10-CM | POA: Diagnosis not present

## 2021-12-21 DIAGNOSIS — E038 Other specified hypothyroidism: Secondary | ICD-10-CM | POA: Diagnosis not present

## 2021-12-21 DIAGNOSIS — K219 Gastro-esophageal reflux disease without esophagitis: Secondary | ICD-10-CM | POA: Diagnosis not present

## 2021-12-21 DIAGNOSIS — J449 Chronic obstructive pulmonary disease, unspecified: Secondary | ICD-10-CM | POA: Diagnosis not present

## 2021-12-21 DIAGNOSIS — I10 Essential (primary) hypertension: Secondary | ICD-10-CM | POA: Diagnosis not present

## 2021-12-21 DIAGNOSIS — M6281 Muscle weakness (generalized): Secondary | ICD-10-CM | POA: Diagnosis not present

## 2021-12-24 DIAGNOSIS — S32414D Nondisplaced fracture of anterior wall of right acetabulum, subsequent encounter for fracture with routine healing: Secondary | ICD-10-CM | POA: Diagnosis not present

## 2021-12-24 DIAGNOSIS — K219 Gastro-esophageal reflux disease without esophagitis: Secondary | ICD-10-CM | POA: Diagnosis not present

## 2021-12-24 DIAGNOSIS — J449 Chronic obstructive pulmonary disease, unspecified: Secondary | ICD-10-CM | POA: Diagnosis not present

## 2021-12-24 DIAGNOSIS — E038 Other specified hypothyroidism: Secondary | ICD-10-CM | POA: Diagnosis not present

## 2021-12-24 DIAGNOSIS — D6959 Other secondary thrombocytopenia: Secondary | ICD-10-CM | POA: Diagnosis not present

## 2021-12-24 DIAGNOSIS — I10 Essential (primary) hypertension: Secondary | ICD-10-CM | POA: Diagnosis not present

## 2021-12-24 DIAGNOSIS — S32810D Multiple fractures of pelvis with stable disruption of pelvic ring, subsequent encounter for fracture with routine healing: Secondary | ICD-10-CM | POA: Diagnosis not present

## 2021-12-24 DIAGNOSIS — M6281 Muscle weakness (generalized): Secondary | ICD-10-CM | POA: Diagnosis not present

## 2021-12-24 DIAGNOSIS — E782 Mixed hyperlipidemia: Secondary | ICD-10-CM | POA: Diagnosis not present

## 2021-12-25 DIAGNOSIS — S32402A Unspecified fracture of left acetabulum, initial encounter for closed fracture: Secondary | ICD-10-CM | POA: Diagnosis not present

## 2021-12-25 DIAGNOSIS — S32592A Other specified fracture of left pubis, initial encounter for closed fracture: Secondary | ICD-10-CM | POA: Diagnosis not present

## 2021-12-28 DIAGNOSIS — S32810D Multiple fractures of pelvis with stable disruption of pelvic ring, subsequent encounter for fracture with routine healing: Secondary | ICD-10-CM | POA: Diagnosis not present

## 2021-12-28 DIAGNOSIS — K219 Gastro-esophageal reflux disease without esophagitis: Secondary | ICD-10-CM | POA: Diagnosis not present

## 2021-12-28 DIAGNOSIS — E782 Mixed hyperlipidemia: Secondary | ICD-10-CM | POA: Diagnosis not present

## 2021-12-28 DIAGNOSIS — J449 Chronic obstructive pulmonary disease, unspecified: Secondary | ICD-10-CM | POA: Diagnosis not present

## 2021-12-28 DIAGNOSIS — I10 Essential (primary) hypertension: Secondary | ICD-10-CM | POA: Diagnosis not present

## 2021-12-28 DIAGNOSIS — S32414D Nondisplaced fracture of anterior wall of right acetabulum, subsequent encounter for fracture with routine healing: Secondary | ICD-10-CM | POA: Diagnosis not present

## 2021-12-28 DIAGNOSIS — D6959 Other secondary thrombocytopenia: Secondary | ICD-10-CM | POA: Diagnosis not present

## 2021-12-28 DIAGNOSIS — M6281 Muscle weakness (generalized): Secondary | ICD-10-CM | POA: Diagnosis not present

## 2021-12-28 DIAGNOSIS — E038 Other specified hypothyroidism: Secondary | ICD-10-CM | POA: Diagnosis not present

## 2021-12-31 DIAGNOSIS — E039 Hypothyroidism, unspecified: Secondary | ICD-10-CM | POA: Diagnosis not present

## 2021-12-31 DIAGNOSIS — I509 Heart failure, unspecified: Secondary | ICD-10-CM | POA: Diagnosis not present

## 2021-12-31 DIAGNOSIS — I130 Hypertensive heart and chronic kidney disease with heart failure and stage 1 through stage 4 chronic kidney disease, or unspecified chronic kidney disease: Secondary | ICD-10-CM | POA: Diagnosis not present

## 2021-12-31 DIAGNOSIS — N189 Chronic kidney disease, unspecified: Secondary | ICD-10-CM | POA: Diagnosis not present

## 2021-12-31 DIAGNOSIS — J449 Chronic obstructive pulmonary disease, unspecified: Secondary | ICD-10-CM | POA: Diagnosis not present

## 2021-12-31 DIAGNOSIS — I739 Peripheral vascular disease, unspecified: Secondary | ICD-10-CM | POA: Diagnosis not present

## 2021-12-31 DIAGNOSIS — E785 Hyperlipidemia, unspecified: Secondary | ICD-10-CM | POA: Diagnosis not present

## 2021-12-31 DIAGNOSIS — R5381 Other malaise: Secondary | ICD-10-CM | POA: Diagnosis not present

## 2022-01-01 DIAGNOSIS — I10 Essential (primary) hypertension: Secondary | ICD-10-CM | POA: Diagnosis not present

## 2022-01-01 DIAGNOSIS — E785 Hyperlipidemia, unspecified: Secondary | ICD-10-CM | POA: Diagnosis not present

## 2022-01-01 DIAGNOSIS — E038 Other specified hypothyroidism: Secondary | ICD-10-CM | POA: Diagnosis not present

## 2022-01-01 DIAGNOSIS — R5381 Other malaise: Secondary | ICD-10-CM | POA: Diagnosis not present

## 2022-01-01 DIAGNOSIS — N189 Chronic kidney disease, unspecified: Secondary | ICD-10-CM | POA: Diagnosis not present

## 2022-01-01 DIAGNOSIS — J449 Chronic obstructive pulmonary disease, unspecified: Secondary | ICD-10-CM | POA: Diagnosis not present

## 2022-01-01 DIAGNOSIS — Z79899 Other long term (current) drug therapy: Secondary | ICD-10-CM | POA: Diagnosis not present

## 2022-01-01 DIAGNOSIS — I739 Peripheral vascular disease, unspecified: Secondary | ICD-10-CM | POA: Diagnosis not present

## 2022-01-01 DIAGNOSIS — E039 Hypothyroidism, unspecified: Secondary | ICD-10-CM | POA: Diagnosis not present

## 2022-01-03 DIAGNOSIS — N189 Chronic kidney disease, unspecified: Secondary | ICD-10-CM | POA: Diagnosis not present

## 2022-01-03 DIAGNOSIS — I739 Peripheral vascular disease, unspecified: Secondary | ICD-10-CM | POA: Diagnosis not present

## 2022-01-03 DIAGNOSIS — I130 Hypertensive heart and chronic kidney disease with heart failure and stage 1 through stage 4 chronic kidney disease, or unspecified chronic kidney disease: Secondary | ICD-10-CM | POA: Diagnosis not present

## 2022-01-03 DIAGNOSIS — E039 Hypothyroidism, unspecified: Secondary | ICD-10-CM | POA: Diagnosis not present

## 2022-01-03 DIAGNOSIS — I509 Heart failure, unspecified: Secondary | ICD-10-CM | POA: Diagnosis not present

## 2022-01-03 DIAGNOSIS — E785 Hyperlipidemia, unspecified: Secondary | ICD-10-CM | POA: Diagnosis not present

## 2022-01-03 DIAGNOSIS — J449 Chronic obstructive pulmonary disease, unspecified: Secondary | ICD-10-CM | POA: Diagnosis not present

## 2022-01-03 DIAGNOSIS — R5381 Other malaise: Secondary | ICD-10-CM | POA: Diagnosis not present

## 2022-01-04 DIAGNOSIS — E785 Hyperlipidemia, unspecified: Secondary | ICD-10-CM | POA: Diagnosis not present

## 2022-01-04 DIAGNOSIS — R011 Cardiac murmur, unspecified: Secondary | ICD-10-CM | POA: Diagnosis not present

## 2022-01-04 DIAGNOSIS — I130 Hypertensive heart and chronic kidney disease with heart failure and stage 1 through stage 4 chronic kidney disease, or unspecified chronic kidney disease: Secondary | ICD-10-CM | POA: Diagnosis not present

## 2022-01-04 DIAGNOSIS — I509 Heart failure, unspecified: Secondary | ICD-10-CM | POA: Diagnosis not present

## 2022-01-04 DIAGNOSIS — I739 Peripheral vascular disease, unspecified: Secondary | ICD-10-CM | POA: Diagnosis not present

## 2022-01-04 DIAGNOSIS — E039 Hypothyroidism, unspecified: Secondary | ICD-10-CM | POA: Diagnosis not present

## 2022-01-04 DIAGNOSIS — J449 Chronic obstructive pulmonary disease, unspecified: Secondary | ICD-10-CM | POA: Diagnosis not present

## 2022-01-04 DIAGNOSIS — R5381 Other malaise: Secondary | ICD-10-CM | POA: Diagnosis not present

## 2022-01-04 DIAGNOSIS — N189 Chronic kidney disease, unspecified: Secondary | ICD-10-CM | POA: Diagnosis not present

## 2022-01-08 DIAGNOSIS — R5381 Other malaise: Secondary | ICD-10-CM | POA: Diagnosis not present

## 2022-01-08 DIAGNOSIS — I509 Heart failure, unspecified: Secondary | ICD-10-CM | POA: Diagnosis not present

## 2022-01-08 DIAGNOSIS — E039 Hypothyroidism, unspecified: Secondary | ICD-10-CM | POA: Diagnosis not present

## 2022-01-08 DIAGNOSIS — E785 Hyperlipidemia, unspecified: Secondary | ICD-10-CM | POA: Diagnosis not present

## 2022-01-08 DIAGNOSIS — I130 Hypertensive heart and chronic kidney disease with heart failure and stage 1 through stage 4 chronic kidney disease, or unspecified chronic kidney disease: Secondary | ICD-10-CM | POA: Diagnosis not present

## 2022-01-08 DIAGNOSIS — N189 Chronic kidney disease, unspecified: Secondary | ICD-10-CM | POA: Diagnosis not present

## 2022-01-08 DIAGNOSIS — I739 Peripheral vascular disease, unspecified: Secondary | ICD-10-CM | POA: Diagnosis not present

## 2022-01-08 DIAGNOSIS — J449 Chronic obstructive pulmonary disease, unspecified: Secondary | ICD-10-CM | POA: Diagnosis not present

## 2022-01-09 DIAGNOSIS — E785 Hyperlipidemia, unspecified: Secondary | ICD-10-CM | POA: Diagnosis not present

## 2022-01-09 DIAGNOSIS — I739 Peripheral vascular disease, unspecified: Secondary | ICD-10-CM | POA: Diagnosis not present

## 2022-01-09 DIAGNOSIS — R5381 Other malaise: Secondary | ICD-10-CM | POA: Diagnosis not present

## 2022-01-09 DIAGNOSIS — N189 Chronic kidney disease, unspecified: Secondary | ICD-10-CM | POA: Diagnosis not present

## 2022-01-09 DIAGNOSIS — E039 Hypothyroidism, unspecified: Secondary | ICD-10-CM | POA: Diagnosis not present

## 2022-01-09 DIAGNOSIS — I509 Heart failure, unspecified: Secondary | ICD-10-CM | POA: Diagnosis not present

## 2022-01-09 DIAGNOSIS — I130 Hypertensive heart and chronic kidney disease with heart failure and stage 1 through stage 4 chronic kidney disease, or unspecified chronic kidney disease: Secondary | ICD-10-CM | POA: Diagnosis not present

## 2022-01-09 DIAGNOSIS — J449 Chronic obstructive pulmonary disease, unspecified: Secondary | ICD-10-CM | POA: Diagnosis not present

## 2022-01-10 DIAGNOSIS — E785 Hyperlipidemia, unspecified: Secondary | ICD-10-CM | POA: Diagnosis not present

## 2022-01-10 DIAGNOSIS — J449 Chronic obstructive pulmonary disease, unspecified: Secondary | ICD-10-CM | POA: Diagnosis not present

## 2022-01-10 DIAGNOSIS — E039 Hypothyroidism, unspecified: Secondary | ICD-10-CM | POA: Diagnosis not present

## 2022-01-10 DIAGNOSIS — I739 Peripheral vascular disease, unspecified: Secondary | ICD-10-CM | POA: Diagnosis not present

## 2022-01-10 DIAGNOSIS — N189 Chronic kidney disease, unspecified: Secondary | ICD-10-CM | POA: Diagnosis not present

## 2022-01-10 DIAGNOSIS — R5381 Other malaise: Secondary | ICD-10-CM | POA: Diagnosis not present

## 2022-01-10 DIAGNOSIS — I509 Heart failure, unspecified: Secondary | ICD-10-CM | POA: Diagnosis not present

## 2022-01-10 DIAGNOSIS — I130 Hypertensive heart and chronic kidney disease with heart failure and stage 1 through stage 4 chronic kidney disease, or unspecified chronic kidney disease: Secondary | ICD-10-CM | POA: Diagnosis not present

## 2022-01-11 DIAGNOSIS — E785 Hyperlipidemia, unspecified: Secondary | ICD-10-CM | POA: Diagnosis not present

## 2022-01-11 DIAGNOSIS — R5381 Other malaise: Secondary | ICD-10-CM | POA: Diagnosis not present

## 2022-01-11 DIAGNOSIS — I739 Peripheral vascular disease, unspecified: Secondary | ICD-10-CM | POA: Diagnosis not present

## 2022-01-11 DIAGNOSIS — I130 Hypertensive heart and chronic kidney disease with heart failure and stage 1 through stage 4 chronic kidney disease, or unspecified chronic kidney disease: Secondary | ICD-10-CM | POA: Diagnosis not present

## 2022-01-11 DIAGNOSIS — J449 Chronic obstructive pulmonary disease, unspecified: Secondary | ICD-10-CM | POA: Diagnosis not present

## 2022-01-11 DIAGNOSIS — N189 Chronic kidney disease, unspecified: Secondary | ICD-10-CM | POA: Diagnosis not present

## 2022-01-11 DIAGNOSIS — I509 Heart failure, unspecified: Secondary | ICD-10-CM | POA: Diagnosis not present

## 2022-01-11 DIAGNOSIS — E039 Hypothyroidism, unspecified: Secondary | ICD-10-CM | POA: Diagnosis not present

## 2022-01-14 DIAGNOSIS — I130 Hypertensive heart and chronic kidney disease with heart failure and stage 1 through stage 4 chronic kidney disease, or unspecified chronic kidney disease: Secondary | ICD-10-CM | POA: Diagnosis not present

## 2022-01-14 DIAGNOSIS — I509 Heart failure, unspecified: Secondary | ICD-10-CM | POA: Diagnosis not present

## 2022-01-14 DIAGNOSIS — R5381 Other malaise: Secondary | ICD-10-CM | POA: Diagnosis not present

## 2022-01-14 DIAGNOSIS — N189 Chronic kidney disease, unspecified: Secondary | ICD-10-CM | POA: Diagnosis not present

## 2022-01-14 DIAGNOSIS — I739 Peripheral vascular disease, unspecified: Secondary | ICD-10-CM | POA: Diagnosis not present

## 2022-01-14 DIAGNOSIS — J449 Chronic obstructive pulmonary disease, unspecified: Secondary | ICD-10-CM | POA: Diagnosis not present

## 2022-01-14 DIAGNOSIS — E785 Hyperlipidemia, unspecified: Secondary | ICD-10-CM | POA: Diagnosis not present

## 2022-01-14 DIAGNOSIS — E039 Hypothyroidism, unspecified: Secondary | ICD-10-CM | POA: Diagnosis not present

## 2022-01-16 DIAGNOSIS — N183 Chronic kidney disease, stage 3 unspecified: Secondary | ICD-10-CM | POA: Diagnosis not present

## 2022-01-16 DIAGNOSIS — I509 Heart failure, unspecified: Secondary | ICD-10-CM | POA: Diagnosis not present

## 2022-01-16 DIAGNOSIS — S32414D Nondisplaced fracture of anterior wall of right acetabulum, subsequent encounter for fracture with routine healing: Secondary | ICD-10-CM | POA: Diagnosis not present

## 2022-01-16 DIAGNOSIS — I130 Hypertensive heart and chronic kidney disease with heart failure and stage 1 through stage 4 chronic kidney disease, or unspecified chronic kidney disease: Secondary | ICD-10-CM | POA: Diagnosis not present

## 2022-01-16 DIAGNOSIS — S32591D Other specified fracture of right pubis, subsequent encounter for fracture with routine healing: Secondary | ICD-10-CM | POA: Diagnosis not present

## 2022-01-16 DIAGNOSIS — S3210XD Unspecified fracture of sacrum, subsequent encounter for fracture with routine healing: Secondary | ICD-10-CM | POA: Diagnosis not present

## 2022-01-16 DIAGNOSIS — S32810D Multiple fractures of pelvis with stable disruption of pelvic ring, subsequent encounter for fracture with routine healing: Secondary | ICD-10-CM | POA: Diagnosis not present

## 2022-01-16 DIAGNOSIS — M6281 Muscle weakness (generalized): Secondary | ICD-10-CM | POA: Diagnosis not present

## 2022-01-16 DIAGNOSIS — S81811D Laceration without foreign body, right lower leg, subsequent encounter: Secondary | ICD-10-CM | POA: Diagnosis not present

## 2022-01-17 DIAGNOSIS — S32592A Other specified fracture of left pubis, initial encounter for closed fracture: Secondary | ICD-10-CM | POA: Diagnosis not present

## 2022-01-17 DIAGNOSIS — S81811D Laceration without foreign body, right lower leg, subsequent encounter: Secondary | ICD-10-CM | POA: Diagnosis not present

## 2022-01-17 DIAGNOSIS — S32591D Other specified fracture of right pubis, subsequent encounter for fracture with routine healing: Secondary | ICD-10-CM | POA: Diagnosis not present

## 2022-01-17 DIAGNOSIS — S32402A Unspecified fracture of left acetabulum, initial encounter for closed fracture: Secondary | ICD-10-CM | POA: Diagnosis not present

## 2022-01-17 DIAGNOSIS — I130 Hypertensive heart and chronic kidney disease with heart failure and stage 1 through stage 4 chronic kidney disease, or unspecified chronic kidney disease: Secondary | ICD-10-CM | POA: Diagnosis not present

## 2022-01-17 DIAGNOSIS — S32414D Nondisplaced fracture of anterior wall of right acetabulum, subsequent encounter for fracture with routine healing: Secondary | ICD-10-CM | POA: Diagnosis not present

## 2022-01-17 DIAGNOSIS — S32810D Multiple fractures of pelvis with stable disruption of pelvic ring, subsequent encounter for fracture with routine healing: Secondary | ICD-10-CM | POA: Diagnosis not present

## 2022-01-17 DIAGNOSIS — S3210XD Unspecified fracture of sacrum, subsequent encounter for fracture with routine healing: Secondary | ICD-10-CM | POA: Diagnosis not present

## 2022-01-17 DIAGNOSIS — M6281 Muscle weakness (generalized): Secondary | ICD-10-CM | POA: Diagnosis not present

## 2022-01-17 DIAGNOSIS — N183 Chronic kidney disease, stage 3 unspecified: Secondary | ICD-10-CM | POA: Diagnosis not present

## 2022-01-17 DIAGNOSIS — I509 Heart failure, unspecified: Secondary | ICD-10-CM | POA: Diagnosis not present

## 2022-01-18 DIAGNOSIS — S81811D Laceration without foreign body, right lower leg, subsequent encounter: Secondary | ICD-10-CM | POA: Diagnosis not present

## 2022-01-18 DIAGNOSIS — N183 Chronic kidney disease, stage 3 unspecified: Secondary | ICD-10-CM | POA: Diagnosis not present

## 2022-01-18 DIAGNOSIS — I130 Hypertensive heart and chronic kidney disease with heart failure and stage 1 through stage 4 chronic kidney disease, or unspecified chronic kidney disease: Secondary | ICD-10-CM | POA: Diagnosis not present

## 2022-01-18 DIAGNOSIS — S3210XD Unspecified fracture of sacrum, subsequent encounter for fracture with routine healing: Secondary | ICD-10-CM | POA: Diagnosis not present

## 2022-01-18 DIAGNOSIS — S32810D Multiple fractures of pelvis with stable disruption of pelvic ring, subsequent encounter for fracture with routine healing: Secondary | ICD-10-CM | POA: Diagnosis not present

## 2022-01-18 DIAGNOSIS — M6281 Muscle weakness (generalized): Secondary | ICD-10-CM | POA: Diagnosis not present

## 2022-01-18 DIAGNOSIS — I509 Heart failure, unspecified: Secondary | ICD-10-CM | POA: Diagnosis not present

## 2022-01-18 DIAGNOSIS — S32591D Other specified fracture of right pubis, subsequent encounter for fracture with routine healing: Secondary | ICD-10-CM | POA: Diagnosis not present

## 2022-01-18 DIAGNOSIS — S32414D Nondisplaced fracture of anterior wall of right acetabulum, subsequent encounter for fracture with routine healing: Secondary | ICD-10-CM | POA: Diagnosis not present

## 2022-01-21 DIAGNOSIS — N183 Chronic kidney disease, stage 3 unspecified: Secondary | ICD-10-CM | POA: Diagnosis not present

## 2022-01-21 DIAGNOSIS — I130 Hypertensive heart and chronic kidney disease with heart failure and stage 1 through stage 4 chronic kidney disease, or unspecified chronic kidney disease: Secondary | ICD-10-CM | POA: Diagnosis not present

## 2022-01-21 DIAGNOSIS — I509 Heart failure, unspecified: Secondary | ICD-10-CM | POA: Diagnosis not present

## 2022-01-21 DIAGNOSIS — S32810D Multiple fractures of pelvis with stable disruption of pelvic ring, subsequent encounter for fracture with routine healing: Secondary | ICD-10-CM | POA: Diagnosis not present

## 2022-01-21 DIAGNOSIS — M6281 Muscle weakness (generalized): Secondary | ICD-10-CM | POA: Diagnosis not present

## 2022-01-21 DIAGNOSIS — S32591D Other specified fracture of right pubis, subsequent encounter for fracture with routine healing: Secondary | ICD-10-CM | POA: Diagnosis not present

## 2022-01-21 DIAGNOSIS — S81811D Laceration without foreign body, right lower leg, subsequent encounter: Secondary | ICD-10-CM | POA: Diagnosis not present

## 2022-01-21 DIAGNOSIS — S32414D Nondisplaced fracture of anterior wall of right acetabulum, subsequent encounter for fracture with routine healing: Secondary | ICD-10-CM | POA: Diagnosis not present

## 2022-01-21 DIAGNOSIS — S3210XD Unspecified fracture of sacrum, subsequent encounter for fracture with routine healing: Secondary | ICD-10-CM | POA: Diagnosis not present

## 2022-01-22 DIAGNOSIS — I10 Essential (primary) hypertension: Secondary | ICD-10-CM | POA: Diagnosis not present

## 2022-01-22 DIAGNOSIS — M6281 Muscle weakness (generalized): Secondary | ICD-10-CM | POA: Diagnosis not present

## 2022-01-22 DIAGNOSIS — N183 Chronic kidney disease, stage 3 unspecified: Secondary | ICD-10-CM | POA: Diagnosis not present

## 2022-01-22 DIAGNOSIS — J449 Chronic obstructive pulmonary disease, unspecified: Secondary | ICD-10-CM | POA: Diagnosis not present

## 2022-01-22 DIAGNOSIS — N189 Chronic kidney disease, unspecified: Secondary | ICD-10-CM | POA: Diagnosis not present

## 2022-01-22 DIAGNOSIS — S32414D Nondisplaced fracture of anterior wall of right acetabulum, subsequent encounter for fracture with routine healing: Secondary | ICD-10-CM | POA: Diagnosis not present

## 2022-01-22 DIAGNOSIS — S32591D Other specified fracture of right pubis, subsequent encounter for fracture with routine healing: Secondary | ICD-10-CM | POA: Diagnosis not present

## 2022-01-22 DIAGNOSIS — E039 Hypothyroidism, unspecified: Secondary | ICD-10-CM | POA: Diagnosis not present

## 2022-01-22 DIAGNOSIS — E785 Hyperlipidemia, unspecified: Secondary | ICD-10-CM | POA: Diagnosis not present

## 2022-01-22 DIAGNOSIS — I509 Heart failure, unspecified: Secondary | ICD-10-CM | POA: Diagnosis not present

## 2022-01-22 DIAGNOSIS — E538 Deficiency of other specified B group vitamins: Secondary | ICD-10-CM | POA: Diagnosis not present

## 2022-01-22 DIAGNOSIS — S3210XD Unspecified fracture of sacrum, subsequent encounter for fracture with routine healing: Secondary | ICD-10-CM | POA: Diagnosis not present

## 2022-01-22 DIAGNOSIS — S72001D Fracture of unspecified part of neck of right femur, subsequent encounter for closed fracture with routine healing: Secondary | ICD-10-CM | POA: Diagnosis not present

## 2022-01-22 DIAGNOSIS — I130 Hypertensive heart and chronic kidney disease with heart failure and stage 1 through stage 4 chronic kidney disease, or unspecified chronic kidney disease: Secondary | ICD-10-CM | POA: Diagnosis not present

## 2022-01-22 DIAGNOSIS — S81811D Laceration without foreign body, right lower leg, subsequent encounter: Secondary | ICD-10-CM | POA: Diagnosis not present

## 2022-01-22 DIAGNOSIS — S32810D Multiple fractures of pelvis with stable disruption of pelvic ring, subsequent encounter for fracture with routine healing: Secondary | ICD-10-CM | POA: Diagnosis not present

## 2022-01-23 ENCOUNTER — Emergency Department: Payer: Medicare Other

## 2022-01-23 ENCOUNTER — Other Ambulatory Visit: Payer: Self-pay

## 2022-01-23 ENCOUNTER — Inpatient Hospital Stay
Admission: EM | Admit: 2022-01-23 | Discharge: 2022-01-28 | DRG: 563 | Disposition: A | Discharge status: Skilled Nursing Facility | Payer: Medicare Other | Source: Skilled Nursing Facility | Attending: Internal Medicine | Admitting: Internal Medicine

## 2022-01-23 DIAGNOSIS — S42202A Unspecified fracture of upper end of left humerus, initial encounter for closed fracture: Principal | ICD-10-CM | POA: Diagnosis present

## 2022-01-23 DIAGNOSIS — M50323 Other cervical disc degeneration at C6-C7 level: Secondary | ICD-10-CM | POA: Diagnosis not present

## 2022-01-23 DIAGNOSIS — Z66 Do not resuscitate: Secondary | ICD-10-CM | POA: Diagnosis not present

## 2022-01-23 DIAGNOSIS — Z7982 Long term (current) use of aspirin: Secondary | ICD-10-CM | POA: Diagnosis not present

## 2022-01-23 DIAGNOSIS — S0990XA Unspecified injury of head, initial encounter: Secondary | ICD-10-CM | POA: Diagnosis not present

## 2022-01-23 DIAGNOSIS — F17200 Nicotine dependence, unspecified, uncomplicated: Secondary | ICD-10-CM | POA: Diagnosis not present

## 2022-01-23 DIAGNOSIS — R197 Diarrhea, unspecified: Secondary | ICD-10-CM | POA: Diagnosis not present

## 2022-01-23 DIAGNOSIS — I129 Hypertensive chronic kidney disease with stage 1 through stage 4 chronic kidney disease, or unspecified chronic kidney disease: Secondary | ICD-10-CM | POA: Diagnosis present

## 2022-01-23 DIAGNOSIS — S32591D Other specified fracture of right pubis, subsequent encounter for fracture with routine healing: Secondary | ICD-10-CM | POA: Diagnosis not present

## 2022-01-23 DIAGNOSIS — W010XXA Fall on same level from slipping, tripping and stumbling without subsequent striking against object, initial encounter: Secondary | ICD-10-CM | POA: Diagnosis present

## 2022-01-23 DIAGNOSIS — D696 Thrombocytopenia, unspecified: Secondary | ICD-10-CM | POA: Diagnosis not present

## 2022-01-23 DIAGNOSIS — S32414D Nondisplaced fracture of anterior wall of right acetabulum, subsequent encounter for fracture with routine healing: Secondary | ICD-10-CM | POA: Diagnosis not present

## 2022-01-23 DIAGNOSIS — N1832 Chronic kidney disease, stage 3b: Secondary | ICD-10-CM | POA: Diagnosis not present

## 2022-01-23 DIAGNOSIS — S32810D Multiple fractures of pelvis with stable disruption of pelvic ring, subsequent encounter for fracture with routine healing: Secondary | ICD-10-CM | POA: Diagnosis not present

## 2022-01-23 DIAGNOSIS — Z79899 Other long term (current) drug therapy: Secondary | ICD-10-CM | POA: Diagnosis not present

## 2022-01-23 DIAGNOSIS — H9190 Unspecified hearing loss, unspecified ear: Secondary | ICD-10-CM | POA: Diagnosis not present

## 2022-01-23 DIAGNOSIS — D649 Anemia, unspecified: Secondary | ICD-10-CM | POA: Diagnosis not present

## 2022-01-23 DIAGNOSIS — Z743 Need for continuous supervision: Secondary | ICD-10-CM | POA: Diagnosis not present

## 2022-01-23 DIAGNOSIS — Z79890 Hormone replacement therapy (postmenopausal): Secondary | ICD-10-CM

## 2022-01-23 DIAGNOSIS — E7800 Pure hypercholesterolemia, unspecified: Secondary | ICD-10-CM | POA: Diagnosis not present

## 2022-01-23 DIAGNOSIS — Z888 Allergy status to other drugs, medicaments and biological substances status: Secondary | ICD-10-CM | POA: Diagnosis not present

## 2022-01-23 DIAGNOSIS — E876 Hypokalemia: Secondary | ICD-10-CM | POA: Diagnosis present

## 2022-01-23 DIAGNOSIS — S81811D Laceration without foreign body, right lower leg, subsequent encounter: Secondary | ICD-10-CM | POA: Diagnosis not present

## 2022-01-23 DIAGNOSIS — R296 Repeated falls: Secondary | ICD-10-CM | POA: Diagnosis not present

## 2022-01-23 DIAGNOSIS — I10 Essential (primary) hypertension: Secondary | ICD-10-CM | POA: Diagnosis not present

## 2022-01-23 DIAGNOSIS — W19XXXA Unspecified fall, initial encounter: Secondary | ICD-10-CM

## 2022-01-23 DIAGNOSIS — I130 Hypertensive heart and chronic kidney disease with heart failure and stage 1 through stage 4 chronic kidney disease, or unspecified chronic kidney disease: Secondary | ICD-10-CM | POA: Diagnosis not present

## 2022-01-23 DIAGNOSIS — Z9181 History of falling: Secondary | ICD-10-CM

## 2022-01-23 DIAGNOSIS — S3210XD Unspecified fracture of sacrum, subsequent encounter for fracture with routine healing: Secondary | ICD-10-CM | POA: Diagnosis not present

## 2022-01-23 DIAGNOSIS — Z043 Encounter for examination and observation following other accident: Secondary | ICD-10-CM | POA: Diagnosis not present

## 2022-01-23 DIAGNOSIS — M6281 Muscle weakness (generalized): Secondary | ICD-10-CM | POA: Diagnosis not present

## 2022-01-23 DIAGNOSIS — I509 Heart failure, unspecified: Secondary | ICD-10-CM | POA: Diagnosis not present

## 2022-01-23 DIAGNOSIS — I714 Abdominal aortic aneurysm, without rupture, unspecified: Secondary | ICD-10-CM | POA: Diagnosis not present

## 2022-01-23 DIAGNOSIS — E871 Hypo-osmolality and hyponatremia: Secondary | ICD-10-CM | POA: Diagnosis not present

## 2022-01-23 DIAGNOSIS — S4982XA Other specified injuries of left shoulder and upper arm, initial encounter: Secondary | ICD-10-CM | POA: Diagnosis not present

## 2022-01-23 DIAGNOSIS — Y92009 Unspecified place in unspecified non-institutional (private) residence as the place of occurrence of the external cause: Secondary | ICD-10-CM

## 2022-01-23 DIAGNOSIS — S42352A Displaced comminuted fracture of shaft of humerus, left arm, initial encounter for closed fracture: Secondary | ICD-10-CM | POA: Diagnosis not present

## 2022-01-23 DIAGNOSIS — M25519 Pain in unspecified shoulder: Secondary | ICD-10-CM | POA: Diagnosis not present

## 2022-01-23 DIAGNOSIS — E039 Hypothyroidism, unspecified: Secondary | ICD-10-CM | POA: Diagnosis present

## 2022-01-23 DIAGNOSIS — N183 Chronic kidney disease, stage 3 unspecified: Secondary | ICD-10-CM | POA: Diagnosis not present

## 2022-01-23 LAB — CBC WITH DIFFERENTIAL/PLATELET
Abs Immature Granulocytes: 0.04 10*3/uL (ref 0.00–0.07)
Basophils Absolute: 0 10*3/uL (ref 0.0–0.1)
Basophils Relative: 0 %
Eosinophils Absolute: 0.2 10*3/uL (ref 0.0–0.5)
Eosinophils Relative: 2 %
HCT: 25.9 % — ABNORMAL LOW (ref 36.0–46.0)
Hemoglobin: 8.3 g/dL — ABNORMAL LOW (ref 12.0–15.0)
Immature Granulocytes: 1 %
Lymphocytes Relative: 21 %
Lymphs Abs: 1.6 10*3/uL (ref 0.7–4.0)
MCH: 32 pg (ref 26.0–34.0)
MCHC: 32 g/dL (ref 30.0–36.0)
MCV: 100 fL (ref 80.0–100.0)
Monocytes Absolute: 0.8 10*3/uL (ref 0.1–1.0)
Monocytes Relative: 11 %
Neutro Abs: 5.2 10*3/uL (ref 1.7–7.7)
Neutrophils Relative %: 65 %
Platelets: 108 10*3/uL — ABNORMAL LOW (ref 150–400)
RBC: 2.59 MIL/uL — ABNORMAL LOW (ref 3.87–5.11)
RDW: 13.2 % (ref 11.5–15.5)
WBC: 7.8 10*3/uL (ref 4.0–10.5)
nRBC: 0 % (ref 0.0–0.2)

## 2022-01-23 LAB — BASIC METABOLIC PANEL
Anion gap: 4 — ABNORMAL LOW (ref 5–15)
BUN: 18 mg/dL (ref 8–23)
CO2: 25 mmol/L (ref 22–32)
Calcium: 9.6 mg/dL (ref 8.9–10.3)
Chloride: 105 mmol/L (ref 98–111)
Creatinine, Ser: 1.33 mg/dL — ABNORMAL HIGH (ref 0.44–1.00)
GFR, Estimated: 40 mL/min — ABNORMAL LOW (ref >60)
Glucose, Bld: 111 mg/dL — ABNORMAL HIGH (ref 70–99)
Potassium: 4 mmol/L (ref 3.5–5.1)
Sodium: 134 mmol/L — ABNORMAL LOW (ref 135–145)

## 2022-01-23 MED ORDER — ACETAMINOPHEN 650 MG RE SUPP: 650.0000 mg | Freq: Four times a day (QID) | RECTAL | Status: DC | PRN

## 2022-01-23 MED ORDER — BUPROPION HCL ER (XL) 150 MG PO TB24
150.0000 mg | ORAL_TABLET | Freq: Every day | ORAL | Status: DC
  Administered 2022-01-24 – 2022-01-28 (×5): 150 mg via ORAL
  Filled 2022-01-23 (×5): qty 1

## 2022-01-23 MED ORDER — SENNOSIDES-DOCUSATE SODIUM 8.6-50 MG PO TABS
1.0000 | ORAL_TABLET | Freq: Every evening | ORAL | Status: DC | PRN
  Administered 2022-01-25: 1 via ORAL
  Filled 2022-01-23: qty 1

## 2022-01-23 MED ORDER — FENTANYL CITRATE PF 50 MCG/ML IJ SOSY: 12.5000 ug | PREFILLED_SYRINGE | INTRAMUSCULAR | Status: DC | PRN

## 2022-01-23 MED ORDER — ACETAMINOPHEN 325 MG PO TABS
650.0000 mg | ORAL_TABLET | Freq: Four times a day (QID) | ORAL | Status: DC | PRN
  Administered 2022-01-26 – 2022-01-28 (×5): 650 mg via ORAL
  Filled 2022-01-23 (×5): qty 2

## 2022-01-23 MED ORDER — CARVEDILOL 25 MG PO TABS: 25.0000 mg | ORAL_TABLET | Freq: Two times a day (BID) | ORAL | Status: DC

## 2022-01-23 MED ORDER — ASPIRIN 81 MG PO CHEW
325.0000 mg | CHEWABLE_TABLET | Freq: Every day | ORAL | Status: DC
  Administered 2022-01-24 – 2022-01-28 (×5): 325 mg via ORAL
  Filled 2022-01-23 (×5): qty 5

## 2022-01-23 MED ORDER — SIMVASTATIN 10 MG PO TABS
40.0000 mg | ORAL_TABLET | Freq: Every day | ORAL | Status: DC
  Administered 2022-01-23 – 2022-01-27 (×5): 40 mg via ORAL
  Filled 2022-01-23 (×8): qty 4

## 2022-01-23 MED ORDER — HEPARIN SODIUM (PORCINE) 5000 UNIT/ML IJ SOLN
5000.0000 [IU] | Freq: Three times a day (TID) | INTRAMUSCULAR | Status: DC
  Administered 2022-01-23 – 2022-01-28 (×15): 5000 [IU] via SUBCUTANEOUS
  Filled 2022-01-23 (×15): qty 1

## 2022-01-23 MED ORDER — ENALAPRIL MALEATE 2.5 MG PO TABS
2.5000 mg | ORAL_TABLET | Freq: Every day | ORAL | Status: DC
  Filled 2022-01-23: qty 1

## 2022-01-23 MED ORDER — OXYCODONE HCL 5 MG PO TABS
2.5000 mg | ORAL_TABLET | ORAL | Status: DC | PRN
  Administered 2022-01-23: 2.5 mg via ORAL
  Filled 2022-01-23: qty 1

## 2022-01-23 MED ORDER — CILOSTAZOL 100 MG PO TABS
100.0000 mg | ORAL_TABLET | Freq: Two times a day (BID) | ORAL | Status: DC
  Administered 2022-01-23 – 2022-01-28 (×10): 100 mg via ORAL
  Filled 2022-01-23 (×11): qty 1

## 2022-01-23 MED ORDER — LEVOTHYROXINE SODIUM 50 MCG PO TABS
125.0000 ug | ORAL_TABLET | Freq: Every day | ORAL | Status: DC
  Administered 2022-01-24 – 2022-01-28 (×5): 125 ug via ORAL
  Filled 2022-01-23 (×5): qty 3

## 2022-01-23 MED ORDER — ESCITALOPRAM OXALATE 10 MG PO TABS
10.0000 mg | ORAL_TABLET | Freq: Every day | ORAL | Status: DC
  Administered 2022-01-24 – 2022-01-28 (×5): 10 mg via ORAL
  Filled 2022-01-23 (×5): qty 1

## 2022-01-23 NOTE — ED Triage Notes (Signed)
Pt to ED via EMS from [facility]. Pt had a witnessed fall while walking with a walker. Pt states "the walker got stuck on something and through me off of it.' Pt has noticeable bruising and shoulder deformity to Left side. Per EMS patient has a hx of falls.

## 2022-01-23 NOTE — H&P (Addendum)
History and Physical    Bianca Horton [id number] DOB: April 17, 1935 DOA: 01/23/2022  PCP: Perrin Maltese, MD   Patient coming from: ALF   Chief Complaint: Left shoulder pain after fall   HPI: Bianca Horton is a pleasant 86 y.o. female with medical history significant for hypertension, hyperlipidemia, hypothyroidism, CKD 3B, chronic anemia and thrombocytopenia, AAA, and admission last month with pelvic fractures that were managed nonoperatively who now presents to the ED with left shoulder pain after a fall.  Patient had been discharged to a rehab facility just over a month ago, recently returned to her assisted living, and was in her usual state of health today when she was ambulating with her walker.  The walker hit the edge of a chair and got stuck, causing her to fall onto her left shoulder. She denies hitting her head or losing consciousness.  Aside from the shoulder pain, she has no complaints.  ED Course: Upon arrival to the ED, patient is found to be afebrile and saturating mid 90s on room air with normal heart rate and stable blood pressure.  No acute findings noted on CT head or CT cervical spine.  Plain radiographs of the left shoulder concerning for impacted angulated comminuted fractures of proximal left humerus.  Blood work notable for stable renal function and stable anemia and thrombocytopenia.  Orthopedic surgery (Dr. Karel Jarvis) was consulted by the ED physician and medical admission for PT eval was recommended (Dr. Karel Jarvis also noted that patient was recently seen by [facility] and they should be consulted in the morning).  Patient is to be placed in immobilizer.  Review of Systems:  All other systems reviewed and apart from HPI, are negative.  Past Medical History:  Diagnosis Date   High cholesterol    Hypertension    Thyroid disease     Past Surgical History:  Procedure Laterality Date   GALLBLADDER SURGERY      Social History:   reports that she has been  smoking. She has never used smokeless tobacco. She reports that she does not drink alcohol and does not use drugs.  Allergies  Allergen Reactions   Clopidogrel     Other reaction(s): brusing skin   Sertraline Hcl Nausea And Vomiting    Other reaction(s): Nausea, Vomiting, Diarrhea, sleepwalking, Nausea, Vomiting, Diarrhea, sleepwalking    History reviewed. No pertinent family history.   Prior to Admission medications   Medication Sig Start Date End Date Taking? Authorizing Provider  acetaminophen (TYLENOL) 500 MG tablet Take 1,000 mg by mouth 3 (three) times daily as needed for mild pain.    [provider]  aspirin 81 MG chewable tablet Chew 325 mg by mouth daily.    [provider]  buPROPion (WELLBUTRIN XL) 150 MG 24 hr tablet  11/27/15   [provider]  calcium carbonate (TUMS - DOSED IN MG ELEMENTAL CALCIUM) 500 MG chewable tablet Chew 1 tablet by mouth 2 (two) times daily as needed for indigestion or heartburn.    [provider]  carvedilol (COREG) 25 MG tablet Take 25 mg by mouth 2 (two) times daily with a meal. 07/22/13   [provider]  cilostazol (PLETAL) 100 MG tablet Take 100 mg by mouth 2 (two) times daily.    [provider]  enalapril (VASOTEC) 2.5 MG tablet Take 2.5 mg by mouth daily. 11/12/21   [provider]  escitalopram (LEXAPRO) 10 MG tablet Take 10 mg by mouth daily. 11/19/21   [provider]  folic acid (FOLVITE) 1 MG tablet Take 1 mg by mouth daily.    [provider]  levothyroxine (SYNTHROID) 125 MCG tablet Take 125 mcg by mouth every morning. 01/24/21   [provider]  Menthol, Topical Analgesic, (ICY HOT ADVANCED RELIEF EX) Apply 1 Application topically as needed.    [provider]  Multiple Vitamins-Minerals (PRESERVISION/LUTEIN) CAPS Take by mouth. Patient not taking: Reported on 10/18/2019    [provider]  simvastatin (ZOCOR) 40 MG tablet  07/22/13    [provider]    Physical Exam: Vitals:   01/23/22 1826 01/23/22 1827  BP: (!) 120/52   Pulse: 86   Resp: 18   Temp: 97.6 F (36.4 C)   TempSrc: Oral   SpO2: 95%   Weight:  52.2 kg  Height:  5\' 4"  (1.626 m)    Constitutional: NAD, calm  Eyes: PERTLA, lids and conjunctivae normal ENMT: Mucous membranes are moist. Posterior pharynx clear of any exudate or lesions.   Neck: supple, no masses  Respiratory:  no wheezing, no crackles. No accessory muscle use.  Cardiovascular: S1 & S2 heard, regular rate and rhythm. No extremity edema. No significant JVD. Abdomen: No distension, no tenderness, soft. Bowel sounds active.  Musculoskeletal: no clubbing / cyanosis. Left shoulder deformity; neurovascularly intact.   Skin: no significant rashes, lesions, ulcers. Warm, dry, well-perfused. Neurologic: Gross hearing deficit. Moving all extremities. Alert and oriented.  Psychiatric: Pleasant. Cooperative.    Labs and Imaging on Admission: I have personally reviewed following labs and imaging studies  CBC: Recent Labs  Lab 01/23/22 1857  WBC 7.8  NEUTROABS 5.2  HGB 8.3*  HCT 25.9*  MCV 100.0  PLT [id number]*   Basic Metabolic Panel: Recent Labs  Lab 01/23/22 1857  NA 134*  K 4.0  CL 105  CO2 25  GLUCOSE 111*  BUN 18  CREATININE 1.33*  CALCIUM 9.6   GFR: Estimated Creatinine Clearance: 26.4 mL/min (A) (by C-G formula based on SCr of 1.33 mg/dL (H)). Liver Function Tests: No results for input(s): "AST", "ALT", "ALKPHOS", "BILITOT", "PROT", "ALBUMIN" in the last 168 hours. No results for input(s): "LIPASE", "AMYLASE" in the last 168 hours. No results for input(s): "AMMONIA" in the last 168 hours. Coagulation Profile: No results for input(s): "INR", "PROTIME" in the last 168 hours. Cardiac Enzymes: No results for input(s): "CKTOTAL", "CKMB", "CKMBINDEX", "TROPONINI" in the last 168 hours. BNP (last 3 results) No results for input(s): "PROBNP" in the last 8760  hours. HbA1C: No results for input(s): "HGBA1C" in the last 72 hours. CBG: No results for input(s): "GLUCAP" in the last 168 hours. Lipid Profile: No results for input(s): "CHOL", "HDL", "LDLCALC", "TRIG", "CHOLHDL", "LDLDIRECT" in the last 72 hours. Thyroid Function Tests: No results for input(s): "TSH", "T4TOTAL", "FREET4", "T3FREE", "THYROIDAB" in the last 72 hours. Anemia Panel: No results for input(s): "VITAMINB12", "FOLATE", "FERRITIN", "TIBC", "IRON", "RETICCTPCT" in the last 72 hours. Urine analysis: No results found for: "COLORURINE", "APPEARANCEUR", "LABSPEC", "PHURINE", "GLUCOSEU", "HGBUR", "BILIRUBINUR", "KETONESUR", "PROTEINUR", "UROBILINOGEN", "NITRITE", "LEUKOCYTESUR" Sepsis Labs: @LABRCNTIP (procalcitonin:4,lacticidven:4) )No results found for this or any previous visit (from the past 240 hour(s)).   Radiological Exams on Admission: CT Head Wo Contrast  Result Date: 01/23/2022 CLINICAL DATA:  Fall head injury EXAM: CT HEAD WITHOUT CONTRAST CT CERVICAL SPINE WITHOUT CONTRAST TECHNIQUE: Multidetector CT imaging of the head and cervical spine was performed following the standard protocol without intravenous contrast. Multiplanar CT image reconstructions of the cervical spine were also generated. RADIATION DOSE REDUCTION: This exam  was performed according to the departmental dose-optimization program which includes automated exposure control, adjustment of the mA and/or kV according to patient size and/or use of iterative reconstruction technique. COMPARISON:  12/06/2021 FINDINGS: CT HEAD FINDINGS Brain: No evidence of acute infarction, hemorrhage, hydrocephalus, extra-axial collection or mass lesion/mass effect. Periventricular and deep white matter hypodensity. Mild global cerebral volume loss. Vascular: No hyperdense vessel or unexpected calcification. Skull: Normal. Negative for fracture or focal lesion. Sinuses/Orbits: No acute finding. Other: None. CT CERVICAL SPINE FINDINGS  Alignment: Degenerative straightening of the normal cervical lordosis. Skull base and vertebrae: No acute fracture. No primary bone lesion or focal pathologic process. Soft tissues and spinal canal: No prevertebral fluid or swelling. No visible canal hematoma. Disc levels: Moderate to severe multilevel disc space height loss and osteophytosis throughout the cervical spine, worst at C6-C7. Upper chest: Negative. Other: None. IMPRESSION: 1. No acute intracranial pathology. Small-vessel white matter disease and mild global cerebral volume loss. 2. No fracture or static subluxation of the cervical spine. 3. Moderate to severe multilevel disc degenerative disease throughout the cervical spine, worst at C6-C7. Electronically Signed   By: Jearld Lesch M.D.   On: 01/23/2022 18:48   CT Cervical Spine Wo Contrast  Result Date: 01/23/2022 CLINICAL DATA:  Fall head injury EXAM: CT HEAD WITHOUT CONTRAST CT CERVICAL SPINE WITHOUT CONTRAST TECHNIQUE: Multidetector CT imaging of the head and cervical spine was performed following the standard protocol without intravenous contrast. Multiplanar CT image reconstructions of the cervical spine were also generated. RADIATION DOSE REDUCTION: This exam was performed according to the departmental dose-optimization program which includes automated exposure control, adjustment of the mA and/or kV according to patient size and/or use of iterative reconstruction technique. COMPARISON:  12/06/2021 FINDINGS: CT HEAD FINDINGS Brain: No evidence of acute infarction, hemorrhage, hydrocephalus, extra-axial collection or mass lesion/mass effect. Periventricular and deep white matter hypodensity. Mild global cerebral volume loss. Vascular: No hyperdense vessel or unexpected calcification. Skull: Normal. Negative for fracture or focal lesion. Sinuses/Orbits: No acute finding. Other: None. CT CERVICAL SPINE FINDINGS Alignment: Degenerative straightening of the normal cervical lordosis. Skull base  and vertebrae: No acute fracture. No primary bone lesion or focal pathologic process. Soft tissues and spinal canal: No prevertebral fluid or swelling. No visible canal hematoma. Disc levels: Moderate to severe multilevel disc space height loss and osteophytosis throughout the cervical spine, worst at C6-C7. Upper chest: Negative. Other: None. IMPRESSION: 1. No acute intracranial pathology. Small-vessel white matter disease and mild global cerebral volume loss. 2. No fracture or static subluxation of the cervical spine. 3. Moderate to severe multilevel disc degenerative disease throughout the cervical spine, worst at C6-C7. Electronically Signed   By: Jearld Lesch M.D.   On: 01/23/2022 18:48   DG Shoulder Left  Result Date: 01/23/2022 CLINICAL DATA:  Fall, deformity EXAM: LEFT SHOULDER - 2+ VIEW COMPARISON:  None Available. FINDINGS: Impacted, angulated, comminuted fractures of the proximal left humerus, involving at least the left surgical neck. Glenohumeral and acromioclavicular joints appear to be in anatomic apposition. Partially imaged left chest is unremarkable. IMPRESSION: Impacted, angulated, comminuted fractures of the proximal left humerus, involving at least the left surgical neck. Glenohumeral and acromioclavicular joints appear to be in anatomic apposition. Consider CT to further evaluate proximal humeral fracture anatomy. Electronically Signed   By: Jearld Lesch M.D.   On: 01/23/2022 18:41    Assessment/Plan   1. Proximal left humerus fracture  - Keep shoulder immobilized and non weight-bearing, control pain, consult PT  2. HTN  - Continue Coreg and enalapril    3. CKD IIIb  - Appears to be at baseline  - Renally-dose medications    4. Anemia; thrombocytopenia  - Appears stable, no overt bleeding   5. Hypothyroidism  - Continue Synthroid    DVT prophylaxis: sq heparin  Code Status: DNR Level of Care: Level of care: Med-Surg Family Communication: Niece at bedside    Disposition Plan:  Patient is from: ALF  Anticipated d/c is to: TBD Anticipated d/c date is: 11/23 or 01/25/22  Patient currently: Pending PT eval  Consults called: none  Admission status: Observation     Vianne Bulls, MD [facility]  01/23/2022, 8:26 PM

## 2022-01-23 NOTE — ED Provider Notes (Signed)
[facility] Provider Note    Event Date/Time   First MD Initiated Contact with Patient 01/23/22 1818     (approximate)   History   Fall and Shoulder Pain   HPI  Bianca Horton is a 86 y.o. female   Past medical history of tension, hyperlipidemia, thyroid disease and a recent hospitalization for a fall and pelvic fracture discharged to rehab facility who presents to the emergency department today with another fall, her walker got stuck somewhere and she had a mechanical fall onto her left side onto her L shoulder with a deformity of the left shoulder.  Denies head strike or loss of consciousness, denies injuries elsewhere and she does not take blood thinners.  She was helped by EMS and was able to bear weight and take a few steps after her injury.  She states otherwise in her regular state of health no other medical complaints and no recent illnesses.  No presyncopal symptoms.  No other injuries or pain elsewhere other than the left shoulder.  History was obtained via the patient. EMS is also present as an independent historian who provided collateral information as above, able to bear weight, and reports neurovascular intact to the left upper extremity while in route to the hospital.  I reviewed a discharge summary from sternal medical notes dated 12/10/2021 after she was hospitalized after sustaining an.  Right pubic ramus fracture, right sacral alla fracture after a mechanical fall at home and was discharged to rehab.      Physical Exam   Triage Vital Signs: ED Triage Vitals  Enc Vitals Group     BP      Pulse      Resp      Temp      Temp src      SpO2      Weight      Height      Head Circumference      Peak Flow      Pain Score      Pain Loc      Pain Edu?      Excl. in GC?     Most recent vital signs: Vitals:   01/23/22 1826  BP: (!) 120/52  Pulse: 86  Resp: 18  Temp: 97.6 F (36.4 C)  SpO2: 95%    General: Awake, no distress.   CV:  Good peripheral perfusion.  Neurovascular intact to the left upper extremity. Resp:  Normal effort.  Abd:  No distention.  Nontender. Other:  She has an obvious deformity to the left shoulder squared off appearance but is neurovascular intact and has no bony tenderness to the elbow or wrist or hand to the side.  She has no chest, back, abdominal, head or neck or other extremity injuries or pain on palpation.  She is able to range both of her hips and lower extremities with full active range of motion.  She has no obvious head trauma.   ED Results / Procedures / Treatments   Labs (all labs ordered are listed, but only abnormal results are displayed) Labs Reviewed  BASIC METABOLIC PANEL - Abnormal; Notable for the following components:      Result Value   Sodium 134 (*)    Glucose, Bld 111 (*)    Creatinine, Ser 1.33 (*)    GFR, Estimated 40 (*)    Anion gap 4 (*)    All other components within normal limits  CBC WITH DIFFERENTIAL/PLATELET -  Abnormal; Notable for the following components:   RBC 2.59 (*)    Hemoglobin 8.3 (*)    HCT 25.9 (*)    Platelets 108 (*)    All other components within normal limits  BASIC METABOLIC PANEL  CBC      RADIOLOGY I independently reviewed and interpreted x-ray of the shoulder and see a proximal humerus fracture   PROCEDURES:  Critical Care performed: No  Procedures   MEDICATIONS ORDERED IN ED: Medications  aspirin chewable tablet 325 mg (has no administration in time range)  carvedilol (COREG) tablet 25 mg (has no administration in time range)  enalapril (VASOTEC) tablet 2.5 mg (has no administration in time range)  simvastatin (ZOCOR) tablet 40 mg (has no administration in time range)  buPROPion (WELLBUTRIN XL) 24 hr tablet 150 mg (has no administration in time range)  escitalopram (LEXAPRO) tablet 10 mg (has no administration in time range)  levothyroxine (SYNTHROID) tablet 125 mcg (has no administration in time range)   cilostazol (PLETAL) tablet 100 mg (has no administration in time range)  heparin injection 5,000 Units (has no administration in time range)  acetaminophen (TYLENOL) tablet 650 mg (has no administration in time range)    Or  acetaminophen (TYLENOL) suppository 650 mg (has no administration in time range)  oxyCODONE (Oxy IR/ROXICODONE) immediate release tablet 2.5 mg (has no administration in time range)  fentaNYL (SUBLIMAZE) injection 12.5-50 mcg (has no administration in time range)  senna-docusate (Senokot-S) tablet 1 tablet (has no administration in time range)     IMPRESSION / MDM / ASSESSMENT AND PLAN / ED COURSE  I reviewed the triage vital signs and the nursing notes.                              Differential diagnosis includes, but is not limited to, fracture or dislocation to the left shoulder, considered syncope but less likely, consider intracranial bleeding in this 86 year old with a fall but denies head strike.  Considered cervical fracture or dislocation.    MDM: Patient with a mechanical fall and left shoulder injury concerning for fracture or dislocation.  Fortunately she is neurovascular intact and reports no other injury and there is no obvious injuries elsewhere on my exam.  This was not a syncopal episode but instead a very clear mechanical slip and fall.  Defer further medical work-up and will focus on traumatic injuries specifically of the left shoulder.  Also obtain a head and neck CT given her age and distracting injury, assess for intracranial bleeding or cervical injury.  Possible humerus fracture on x-ray, neurovascular intact Aberman consulted from orthopedics. Recommendation for sling and NWB and discharged back to rehab No longer in rehab unfortunately was just discharged from rehab back to her assisted living a couple days ago. She is typically ambulatory with walker, and can no longer do this with nonweightbearing and sling.  Admit.   Inpatient doctor  informed that [facility] to be consulted while inpatient given these were the doctors who managed her prior pelvic fracture and have established care.  Patient's presentation is most consistent with acute presentation with potential threat to life or bodily function.       FINAL CLINICAL IMPRESSION(S) / ED DIAGNOSES   Final diagnoses:  Closed fracture of proximal end of left humerus, unspecified fracture morphology, initial encounter  Fall, initial encounter     Rx / DC Orders   ED Discharge Orders  None        Note:  This document was prepared using Dragon voice recognition software and may include unintentional dictation errors.    Pilar Jarvis, MD 01/23/22 2027

## 2022-01-24 DIAGNOSIS — S42202A Unspecified fracture of upper end of left humerus, initial encounter for closed fracture: Secondary | ICD-10-CM | POA: Diagnosis not present

## 2022-01-24 LAB — CBC
HCT: 22.8 % — ABNORMAL LOW (ref 36.0–46.0)
Hemoglobin: 7.3 g/dL — ABNORMAL LOW (ref 12.0–15.0)
MCH: 32 pg (ref 26.0–34.0)
MCHC: 32 g/dL (ref 30.0–36.0)
MCV: 100 fL (ref 80.0–100.0)
Platelets: 92 10*3/uL — ABNORMAL LOW (ref 150–400)
RBC: 2.28 MIL/uL — ABNORMAL LOW (ref 3.87–5.11)
RDW: 13.4 % (ref 11.5–15.5)
WBC: 6.8 10*3/uL (ref 4.0–10.5)
nRBC: 0 % (ref 0.0–0.2)

## 2022-01-24 LAB — BASIC METABOLIC PANEL
Anion gap: 5 (ref 5–15)
BUN: 19 mg/dL (ref 8–23)
CO2: 22 mmol/L (ref 22–32)
Calcium: 9.2 mg/dL (ref 8.9–10.3)
Chloride: 106 mmol/L (ref 98–111)
Creatinine, Ser: 1.22 mg/dL — ABNORMAL HIGH (ref 0.44–1.00)
GFR, Estimated: 44 mL/min — ABNORMAL LOW (ref >60)
Glucose, Bld: 102 mg/dL — ABNORMAL HIGH (ref 70–99)
Potassium: 3.4 mmol/L — ABNORMAL LOW (ref 3.5–5.1)
Sodium: 133 mmol/L — ABNORMAL LOW (ref 135–145)

## 2022-01-24 MED ORDER — SODIUM CHLORIDE 0.9 % IV SOLN: INTRAVENOUS | Status: DC

## 2022-01-24 MED ORDER — POTASSIUM CHLORIDE CRYS ER 20 MEQ PO TBCR
40.0000 meq | EXTENDED_RELEASE_TABLET | Freq: Once | ORAL | Status: AC
  Administered 2022-01-24: 40 meq via ORAL
  Filled 2022-01-24: qty 2

## 2022-01-24 NOTE — TOC Initial Note (Signed)
Transition of Care [facility]) - Initial/Assessment Note    Patient Details  Name: Bianca Horton MRN: [id number] Date of Birth: 12-23-1935  [facility] CM/SW Contact:    Marlowe Sax, RN Phone Number: 01/24/2022, 12:51 PM  Clinical Narrative:                 The patient resides at [facility] She went to [facility] in Oct and would like to return to STR She usually walks with a rolling walker Arm is now in a sling and she can not use a rolling walker as a result FL2 completed, PASSR obtained, Bedsearch sent  Expected Discharge Plan: Skilled Nursing Facility Barriers to Discharge: SNF Pending bed offer, Insurance Authorization   Patient Goals and CMS Choice        Expected Discharge Plan and Services Expected Discharge Plan: Skilled Nursing Facility       Living arrangements for the past 2 months: Assisted Living Facility                                      Prior Living Arrangements/Services Living arrangements for the past 2 months: Assisted Living Facility Lives with:: Facility Resident              Current home services: DME (rolling walker)    Activities of Daily Living Home Assistive Devices/Equipment: Environmental consultant (specify type) ADL Screening (condition at time of admission) Patient's cognitive ability adequate to safely complete daily activities?: Yes Is the patient deaf or have difficulty hearing?: Yes Does the patient have difficulty seeing, even when wearing glasses/contacts?: Yes Does the patient have difficulty concentrating, remembering, or making decisions?: No Patient able to express need for assistance with ADLs?: Yes Does the patient have difficulty dressing or bathing?: Yes Independently performs ADLs?: No Communication: Needs assistance Is this a change from baseline?: Change from baseline, expected to last <3 days Dressing (OT): Needs assistance Is this a change from baseline?: Change from baseline,  expected to last <3days Grooming: Needs assistance Is this a change from baseline?: Change from baseline, expected to last <3 days Feeding: Needs assistance Is this a change from baseline?: Change from baseline, expected to last <3 days Bathing: Needs assistance Is this a change from baseline?: Change from baseline, expected to last <3 days Toileting: Needs assistance Is this a change from baseline?: Change from baseline, expected to last <3 days In/Out Bed: Needs assistance Is this a change from baseline?: Change from baseline, expected to last <3 days Walks in Home: Independent with device (comment) Does the patient have difficulty walking or climbing stairs?: Yes Weakness of Legs: Both Weakness of Arms/Hands: Left  Permission Sought/Granted                  Emotional Assessment              Admission diagnosis:  Fall, initial encounter [W19.XXXA] Closed fracture of proximal end of left humerus, unspecified fracture morphology, initial encounter [S42.202A] Patient Active Problem List   Diagnosis Date Noted   Closed fracture of proximal end of left humerus, unspecified fracture morphology, initial encounter 01/23/2022   Pelvic fracture (HCC) 12/09/2021   Closed nondisplaced fracture of right acetabulum (HCC) 12/07/2021   Intractable pain 12/06/2021   Closed nondisplaced fracture of right acetabulum, unspecified portion of acetabulum, initial encounter (HCC) 12/06/2021   Hypothyroidism 12/06/2021   Inferior pubic  ramus fracture-right 12/06/2021   Fall at home, initial encounter 12/06/2021   Stage 3b chronic kidney disease (CKD) (HCC) 12/06/2021   Tobacco abuse 12/06/2021   Bronchitis 12/06/2021   Thrombocytopenia (HCC) 12/06/2021   Acute bronchitis 12/06/2021   Essential hypertension 10/19/2019   Hyperlipidemia 10/19/2019   DJD (degenerative joint disease) 10/19/2019   AAA (abdominal aortic aneurysm) without rupture (HCC) 10/18/2019   PCP:  Margaretann Loveless,  MD Pharmacy:   [address] [place], [place] - [address] [place] [address] Phone: [phone] Fax: [phone]  [facility] - [place], [place] - [address] [place] [address] Phone: [phone] Fax: [phone]  [id number] [place], [place] - [address] AT [facility] [address] [place] [address] Phone: [phone] Fax: [phone]     Social Determinants of Health (SDOH) Interventions    Readmission Risk Interventions     No data to display

## 2022-01-24 NOTE — Evaluation (Signed)
Physical Therapy Evaluation Patient Details Name: Bianca Horton MRN: [id number] DOB: Jul 17, 1935 Today's Date: 01/24/2022  History of Present Illness  Bianca Horton is an 19yoF who presents after mechanical fall at facility while using walker, workup revealing of Left humeral deformity. Pt was here in 2nd week October after a fall and subsequent fractures to Rt acetabulum, Rt pubic Ramus, Rt sacral Ala, most recently advanced to WBAT BLE. PMH: HTN, HLD, AAA, hypothyroidism, depression, CKD stage III, tobacco abuse. Imaging reports revealing of "impacted, angulated, comminuted fractures of the proximal left humerus, involving at least the left surgical neck. Glenohumeral and acromioclavicular joints appear to be in anatomic apposition." Ortho consult recommending conservative management in sling, NWB.  Clinical Impression  Pt in bed awake, appears calm, pleasant, but makes known her left arm is quite painful. Sling is donned appropriately. Pt requires totalA to get from reclined in bed to EOB, this despite following cues and making efforts. Pt attempts to rise to standing with maxA from author, but is unable to achieve full upright stance and bails, has presyncope upon attempt, assisted back to semi recline in bed totalA with a brief nausea wave. Pt educated on NWB status LUE. Per Dr. Sabra Heck, pt recently approved for WBAT BLE PTA. Pt will require considerable assistance with ADL performance at DC, cannot be met in her ALF, will require a higher level of care. A STR stay will help patient develop new motor strategies for transfers with unilateral arm assist.      Recommendations for follow up therapy are one component of a multi-disciplinary discharge planning process, led by the attending physician.  Recommendations may be updated based on patient status, additional functional criteria and insurance authorization.  Follow Up Recommendations Skilled nursing-short term rehab (<3 hours/day) Can patient  physically be transported by private vehicle: No    Assistance Recommended at Discharge Intermittent Supervision/Assistance  Patient can return home with the following  Two people to help with walking and/or transfers;Two people to help with bathing/dressing/bathroom;Help with stairs or ramp for entrance;Assist for transportation;Assistance with feeding    Equipment Recommendations None recommended by PT  Recommendations for Other Services       Functional Status Assessment Patient has had a recent decline in their functional status and demonstrates the ability to make significant improvements in function in a reasonable and predictable amount of time.     Precautions / Restrictions Restrictions Weight Bearing Restrictions: Yes LUE Weight Bearing: Non weight bearing RLE Weight Bearing: Weight bearing as tolerated (per verbal update from Dr. Sabra Heck; pt recently seen as outpatient regarding pelvis practures) LLE Weight Bearing: Weight bearing as tolerated (per verbal update from Dr. Sabra Heck; pt recently seen as outpatient regarding pelvis practures)      Mobility  Bed Mobility Overal bed mobility: Needs Assistance Bed Mobility: Supine to Sit, Sit to Supine     Supine to sit: Total assist, +2 for physical assistance, HOB elevated Sit to supine: Total assist, +2 for physical assistance, HOB elevated        Transfers Overall transfer level: Needs assistance Equipment used: 1 person hand held assist Transfers: Sit to/from Stand Sit to Stand: Max assist           General transfer comment: unable to achieve full erection, but much ado upon initital attempt, suspect some presyncope given her brief wave of severe nausea that followed    Ambulation/Gait                  Stairs  Wheelchair Mobility    Modified Rankin (Stroke Patients Only)       Balance                                             Pertinent Vitals/Pain Pain  Assessment Pain Assessment: Faces Faces Pain Scale: Hurts even more Pain Location: left arm, not rated when asked Pain Intervention(s): Limited activity within patient's tolerance    Home Living Family/patient expects to be discharged to:: Assisted living                        Prior Function Prior Level of Function : Patient poor historian/Family not available             Mobility Comments: Per patient she walks with a walker for short distances.       Hand Dominance        Extremity/Trunk Assessment                Communication      Cognition Arousal/Alertness: Awake/alert   Overall Cognitive Status: Difficult to assess                                          General Comments      Exercises     Assessment/Plan    PT Assessment Patient needs continued PT services  PT Problem List Decreased strength;Decreased activity tolerance;Decreased mobility       PT Treatment Interventions DME instruction;Gait training;Stair training;Functional mobility training;Therapeutic activities;Balance training;Therapeutic exercise;Neuromuscular re-education;Patient/family education    PT Goals (Current goals can be found in the Care Plan section)  Acute Rehab PT Goals Patient Stated Goal: never fall again PT Goal Formulation: With patient Time For Goal Achievement: 02/07/22 Potential to Achieve Goals: Fair    Frequency 7X/week     Co-evaluation               AM-PAC PT "6 Clicks" Mobility  Outcome Measure Help needed turning from your back to your side while in a flat bed without using bedrails?: Total Help needed moving from lying on your back to sitting on the side of a flat bed without using bedrails?: Total Help needed moving to and from a bed to a chair (including a wheelchair)?: Total Help needed standing up from a chair using your arms (e.g., wheelchair or bedside chair)?: Total Help needed to walk in hospital room?:  Total Help needed climbing 3-5 steps with a railing? : Total 6 Click Score: 6    End of Session   Activity Tolerance: Treatment limited secondary to medical complications (Comment);Patient limited by pain (presyncope/pain/weakness) Patient left: in bed;with call bell/phone within reach;with bed alarm set Nurse Communication: Mobility status PT Visit Diagnosis: Unsteadiness on feet (R26.81);Other abnormalities of gait and mobility (R26.89);Difficulty in walking, not elsewhere classified (R26.2);Muscle weakness (generalized) (M62.81)    Time: 1007-1020 PT Time Calculation (min) (ACUTE ONLY): 13 min   Charges:   PT Evaluation $PT Eval Moderate Complexity: 1 Mod         1:39 PM, 01/24/22 Etta Grandchild, PT, DPT Physical Therapist - [facility]  [phone] ([place])    Etha Stambaugh C 01/24/2022, 1:35 PM

## 2022-01-24 NOTE — Progress Notes (Signed)
PROGRESS NOTE  Bianca Horton  HLK:[id number] DOB: [birth year] DOA: 01/23/2022 PCP: Margaretann Loveless, MD   Brief Narrative: Patient is a 86 year old female with past medical history of hypertension, hyperlipidemia, hypothyroidism, stage IIIb CKD, chronic anemia/thrombocytopenia, abdominal aortic aneurysm who presented with complaint of left shoulder pain after a fall at ALF while walking with a walker.  X-ray showed displaced proximal left humeral fracture.  She just had right pelvic fracture on month ago.  Orthopedics consulted.  Recommended conservative management with sling, outpatient follow-up with orthopedics.  Awaiting PT/OT recommendation,might need SnF  Assessment & Plan:  Principal Problem:   Closed fracture of proximal end of left humerus, unspecified fracture morphology, initial encounter Active Problems:   Essential hypertension   Stage 3b chronic kidney disease (CKD) (HCC)   Hypothyroidism   Thrombocytopenia (HCC)  Left humeral fracture: She was walking with a walker, her walker got caught up and she fell.  Felt pain on the left shoulder.  X-ray showed impacted angulated commuted fracture of proximal left humerus. No head injury.  CT head, cervical spine without any acute findings. Orthopedics consulted.Recommended conservative management with sling, outpatient follow-up with orthopedics.  Awaiting PT/OT recommendation  Hypertension: Blood pressure has been soft this morning.  Home Coreg and enalapril on hold..Monitor blood pressure  CKD stage IIIb: Currently kidney function at baseline.  Chronic normocytic anemia/thrombocytopenia: Has chronic normocytic anemia with baseline hemoglobin ranges from 7-8.  Has chronic thrombocytopenia.  Continue to monitor  Hypothyroidism: Continue Synthyroid  Hypokalemia: Supplemented with potassium  Frequent falls/deconditioning: She is staying at ALF .Ambulates with walker.  She has a history of frequent falls.  Needs PT/OT evaluation and  possible care at SNF        DVT prophylaxis:heparin injection 5,000 Units Start: 01/23/22 2200     Code Status: DNR  Family Communication: Niece French Ana on phone today  Patient status:Obs  Patient is from :ALF  Anticipated discharge to:SnF  Estimated DC date:1-2 days   Consultants: Ortho  Procedures:None  Antimicrobials:  Anti-infectives (From admission, onward)    None       Subjective: Patient seen and examined at the bedside this morning.  Overall looks comfortable, hemodynamically stable.  Lying in bed.  Very hard of hearing.  Denies any significant pain on the left arm.  Alert, awake, obeys commands ,might be slightly confused to time, oriented to place  Objective: Vitals:   01/23/22 2105 01/23/22 2147 01/24/22 0031 01/24/22 0838  BP: 124/60 (!) 151/71 (!) 98/53 (!) 98/51  Pulse: 82 75 89 89  Resp: 18 16 16 16   Temp: 97.8 F (36.6 C) 97.8 F (36.6 C) (!) 97.5 F (36.4 C) 98.5 F (36.9 C)  TempSrc: Oral  Oral   SpO2: 94% 95% 93% 91%  Weight:      Height:        Intake/Output Summary (Last 24 hours) at 01/24/2022 1252 Last data filed at 01/23/2022 2205 Gross per 24 hour  Intake 240 ml  Output --  Net 240 ml   Filed Weights   01/23/22 1827  Weight: 52.2 kg    Examination:  General exam: Overall comfortable, not in distress, pleasant elderly female HEENT: PERRL, very hard of hearing Respiratory system:  no wheezes or crackles  Cardiovascular system: S1 & S2 heard, RRR.  Gastrointestinal system: Abdomen is nondistended, soft and nontender. Central nervous system: Alert and oriented to place Extremities: No edema, no clubbing ,no cyanosis, sling on the left arm Skin: No rashes, no ulcers,no icterus  Data Reviewed: I have personally reviewed following labs and imaging studies  CBC: Recent Labs  Lab 01/23/22 1857 01/24/22 0556  WBC 7.8 6.8  NEUTROABS 5.2  --   HGB 8.3* 7.3*  HCT 25.9* 22.8*  MCV 100.0 100.0  PLT 108* 92*    Basic Metabolic Panel: Recent Labs  Lab 01/23/22 1857 01/24/22 0556  NA 134* 133*  K 4.0 3.4*  CL 105 106  CO2 25 22  GLUCOSE 111* 102*  BUN 18 19  CREATININE 1.33* 1.22*  CALCIUM 9.6 9.2     No results found for this or any previous visit (from the past 240 hour(s)).   Radiology Studies: CT Head Wo Contrast  Result Date: 01/23/2022 CLINICAL DATA:  Fall head injury EXAM: CT HEAD WITHOUT CONTRAST CT CERVICAL SPINE WITHOUT CONTRAST TECHNIQUE: Multidetector CT imaging of the head and cervical spine was performed following the standard protocol without intravenous contrast. Multiplanar CT image reconstructions of the cervical spine were also generated. RADIATION DOSE REDUCTION: This exam was performed according to the departmental dose-optimization program which includes automated exposure control, adjustment of the mA and/or kV according to patient size and/or use of iterative reconstruction technique. COMPARISON:  12/06/2021 FINDINGS: CT HEAD FINDINGS Brain: No evidence of acute infarction, hemorrhage, hydrocephalus, extra-axial collection or mass lesion/mass effect. Periventricular and deep white matter hypodensity. Mild global cerebral volume loss. Vascular: No hyperdense vessel or unexpected calcification. Skull: Normal. Negative for fracture or focal lesion. Sinuses/Orbits: No acute finding. Other: None. CT CERVICAL SPINE FINDINGS Alignment: Degenerative straightening of the normal cervical lordosis. Skull base and vertebrae: No acute fracture. No primary bone lesion or focal pathologic process. Soft tissues and spinal canal: No prevertebral fluid or swelling. No visible canal hematoma. Disc levels: Moderate to severe multilevel disc space height loss and osteophytosis throughout the cervical spine, worst at C6-C7. Upper chest: Negative. Other: None. IMPRESSION: 1. No acute intracranial pathology. Small-vessel white matter disease and mild global cerebral volume loss. 2. No fracture or  static subluxation of the cervical spine. 3. Moderate to severe multilevel disc degenerative disease throughout the cervical spine, worst at C6-C7. Electronically Signed   By: Jearld Lesch M.D.   On: 01/23/2022 18:48   CT Cervical Spine Wo Contrast  Result Date: 01/23/2022 CLINICAL DATA:  Fall head injury EXAM: CT HEAD WITHOUT CONTRAST CT CERVICAL SPINE WITHOUT CONTRAST TECHNIQUE: Multidetector CT imaging of the head and cervical spine was performed following the standard protocol without intravenous contrast. Multiplanar CT image reconstructions of the cervical spine were also generated. RADIATION DOSE REDUCTION: This exam was performed according to the departmental dose-optimization program which includes automated exposure control, adjustment of the mA and/or kV according to patient size and/or use of iterative reconstruction technique. COMPARISON:  12/06/2021 FINDINGS: CT HEAD FINDINGS Brain: No evidence of acute infarction, hemorrhage, hydrocephalus, extra-axial collection or mass lesion/mass effect. Periventricular and deep white matter hypodensity. Mild global cerebral volume loss. Vascular: No hyperdense vessel or unexpected calcification. Skull: Normal. Negative for fracture or focal lesion. Sinuses/Orbits: No acute finding. Other: None. CT CERVICAL SPINE FINDINGS Alignment: Degenerative straightening of the normal cervical lordosis. Skull base and vertebrae: No acute fracture. No primary bone lesion or focal pathologic process. Soft tissues and spinal canal: No prevertebral fluid or swelling. No visible canal hematoma. Disc levels: Moderate to severe multilevel disc space height loss and osteophytosis throughout the cervical spine, worst at C6-C7. Upper chest: Negative. Other: None. IMPRESSION: 1. No acute intracranial pathology. Small-vessel white matter disease and mild global cerebral  volume loss. 2. No fracture or static subluxation of the cervical spine. 3. Moderate to severe multilevel disc  degenerative disease throughout the cervical spine, worst at C6-C7. Electronically Signed   By: Jearld Lesch M.D.   On: 01/23/2022 18:48   DG Shoulder Left  Result Date: 01/23/2022 CLINICAL DATA:  Fall, deformity EXAM: LEFT SHOULDER - 2+ VIEW COMPARISON:  None Available. FINDINGS: Impacted, angulated, comminuted fractures of the proximal left humerus, involving at least the left surgical neck. Glenohumeral and acromioclavicular joints appear to be in anatomic apposition. Partially imaged left chest is unremarkable. IMPRESSION: Impacted, angulated, comminuted fractures of the proximal left humerus, involving at least the left surgical neck. Glenohumeral and acromioclavicular joints appear to be in anatomic apposition. Consider CT to further evaluate proximal humeral fracture anatomy. Electronically Signed   By: Jearld Lesch M.D.   On: 01/23/2022 18:41    Scheduled Meds:  aspirin  325 mg Oral Daily   buPROPion  150 mg Oral Daily   cilostazol  100 mg Oral BID   escitalopram  10 mg Oral Daily   heparin  5,000 Units Subcutaneous Q8H   levothyroxine  125 mcg Oral Q0600   simvastatin  40 mg Oral q1800   Continuous Infusions:   LOS: 0 days   Burnadette Pop, MD [facility] P11/23/2023, 12:52 PM

## 2022-01-24 NOTE — Plan of Care (Signed)

## 2022-01-24 NOTE — [place] FL2 (Signed)
[place] MEDICAID FL2 LEVEL OF CARE SCREENING TOOL     IDENTIFICATION  Patient Name: Bianca Horton Birthdate: [birth year] Sex: female Admission Date (Current Location): 01/23/2022  [facility] and [place] Number:  Chiropodist and Address:  [facility], [address], [place], [place] [address]      Provider Number: [id number]  Attending Physician Name and Address:  Burnadette Pop, MD  Relative Name and Phone Number:  Sheralyn Boatman Niece    Current Level of Care: Hospital Recommended Level of Care: Skilled Nursing Facility Prior Approval Number:    Date Approved/Denied:   PASRR Number: [id number]  Discharge Plan: SNF    Current Diagnoses: Patient Active Problem List   Diagnosis Date Noted   Closed fracture of proximal end of left humerus, unspecified fracture morphology, initial encounter 01/23/2022   Pelvic fracture (HCC) 12/09/2021   Closed nondisplaced fracture of right acetabulum (HCC) 12/07/2021   Intractable pain 12/06/2021   Closed nondisplaced fracture of right acetabulum, unspecified portion of acetabulum, initial encounter (HCC) 12/06/2021   Hypothyroidism 12/06/2021   Inferior pubic ramus fracture-right 12/06/2021   Fall at home, initial encounter 12/06/2021   Stage 3b chronic kidney disease (CKD) (HCC) 12/06/2021   Tobacco abuse 12/06/2021   Bronchitis 12/06/2021   Thrombocytopenia (HCC) 12/06/2021   Acute bronchitis 12/06/2021   Essential hypertension 10/19/2019   Hyperlipidemia 10/19/2019   DJD (degenerative joint disease) 10/19/2019   AAA (abdominal aortic aneurysm) without rupture (HCC) 10/18/2019    Orientation RESPIRATION BLADDER Height & Weight     Self, Time, Situation, Place  Normal Continent, External catheter Weight: 52.2 kg Height:  5\' 4"  (162.6 cm)  BEHAVIORAL SYMPTOMS/MOOD NEUROLOGICAL BOWEL NUTRITION STATUS      Continent Diet (See DC Summary)  AMBULATORY STATUS COMMUNICATION OF NEEDS Skin   Extensive  Assist   Normal                       Personal Care Assistance Level of Assistance  Bathing, Feeding, Dressing Bathing Assistance: Maximum assistance Feeding assistance: Limited assistance Dressing Assistance: Maximum assistance     Functional Limitations Info             SPECIAL CARE FACTORS FREQUENCY  PT (By licensed PT), OT (By licensed OT)     PT Frequency: 5 times per week OT Frequency: 5 times per week            Contractures Contractures Info: Not present    Additional Factors Info  Code Status, Allergies Code Status Info: DNR Allergies Info: Clopidogrel, Sertraline Hcl           Current Medications (01/24/2022):  This is the current hospital active medication list Current Facility-Administered Medications  Medication Dose Route Frequency Provider Last Rate Last Admin   acetaminophen (TYLENOL) tablet 650 mg  650 mg Oral Q6H PRN Opyd, 01/26/2022, MD       Or   acetaminophen (TYLENOL) suppository 650 mg  650 mg Rectal Q6H PRN Opyd, Lavone Neri, MD       aspirin chewable tablet 325 mg  325 mg Oral Daily Opyd, Lavone Neri, MD   325 mg at 01/24/22 01/26/22   buPROPion (WELLBUTRIN XL) 24 hr tablet 150 mg  150 mg Oral Daily Opyd, 7353, MD   150 mg at 01/24/22 01/26/22   cilostazol (PLETAL) tablet 100 mg  100 mg Oral BID 2992, MD   100 mg at 01/24/22 0823   escitalopram (LEXAPRO) tablet  10 mg  10 mg Oral Daily Opyd, Lavone Neri, MD   10 mg at 01/24/22 5397   fentaNYL (SUBLIMAZE) injection 12.5-50 mcg  12.5-50 mcg Intravenous Q2H PRN Opyd, Lavone Neri, MD       heparin injection 5,000 Units  5,000 Units Subcutaneous Q8H Opyd, Lavone Neri, MD   5,000 Units at 01/24/22 0526   levothyroxine (SYNTHROID) tablet 125 mcg  125 mcg Oral Q0600 Briscoe Deutscher, MD   125 mcg at 01/24/22 6734   oxyCODONE (Oxy IR/ROXICODONE) immediate release tablet 2.5 mg  2.5 mg Oral Q4H PRN Opyd, Lavone Neri, MD   2.5 mg at 01/23/22 2205   senna-docusate (Senokot-S) tablet 1 tablet  1 tablet  Oral QHS PRN Opyd, Lavone Neri, MD       simvastatin (ZOCOR) tablet 40 mg  40 mg Oral q1800 Opyd, Lavone Neri, MD   40 mg at 01/23/22 2205     Discharge Medications: Please see discharge summary for a list of discharge medications.  Relevant Imaging Results:  Relevant Lab Results:   Additional Information SS# [id number]  Marlowe Sax, RN

## 2022-01-24 NOTE — Consult Note (Signed)
ORTHOPAEDIC CONSULTATION  REQUESTING PHYSICIAN: Burnadette Pop, MD  Chief Complaint: Pain left shoulder  HPI: Bianca Horton is a 86 y.o. female who complains of   left shoulder pain after a fall at home last night.  She was brought to the emergency room where exam and x-rays revealed a displaced proximal left humerus fracture.  The patient is recovering from right pelvic fractures a month or so ago.  She was at rehab but had return to assisted living and was ambulating with a walker.  She is admitted for stabilization and skilled nursing rehab.  Past Medical History:  Diagnosis Date   High cholesterol    Hypertension    Thyroid disease    Past Surgical History:  Procedure Laterality Date   GALLBLADDER SURGERY     Social History   Socioeconomic History   Marital status: Single    Spouse name: Not on file   Number of children: Not on file   Years of education: Not on file   Highest education level: Not on file  Occupational History   Not on file  Tobacco Use   Smoking status: Light Smoker   Smokeless tobacco: Never   Tobacco comments:    process of quitting   Substance and Sexual Activity   Alcohol use: Never   Drug use: Never   Sexual activity: Not on file  Other Topics Concern   Not on file  Social History Narrative   Not on file   Social Determinants of Health   Financial Resource Strain: Not on file  Food Insecurity: No Food Insecurity (01/23/2022)   Hunger Vital Sign    Worried About Running Out of Food in the Last Year: Never true    Ran Out of Food in the Last Year: Never true  Transportation Needs: No Transportation Needs (01/23/2022)   PRAPARE - Administrator, Civil Service (Medical): No    Lack of Transportation (Non-Medical): No  Physical Activity: Not on file  Stress: Not on file  Social Connections: Not on file   History reviewed. No pertinent family history. Allergies  Allergen Reactions   Clopidogrel     Other reaction(s):  brusing skin   Sertraline Hcl Nausea And Vomiting    Other reaction(s): Nausea, Vomiting, Diarrhea, sleepwalking, Nausea, Vomiting, Diarrhea, sleepwalking   Prior to Admission medications   Medication Sig Start Date End Date Taking? Authorizing Provider  aspirin 81 MG chewable tablet Chew 81 mg by mouth daily.   Yes [provider]  buPROPion (WELLBUTRIN XL) 150 MG 24 hr tablet Take 150 mg by mouth daily. 11/27/15  Yes [provider]  carvedilol (COREG) 25 MG tablet Take 25 mg by mouth 2 (two) times daily with a meal. 07/22/13  Yes [provider]  chlorhexidine (PERIDEX) 0.12 % solution Use as directed 15 mLs in the mouth or throat 2 (two) times daily. 01/01/22  Yes [provider]  cilostazol (PLETAL) 100 MG tablet Take 100 mg by mouth 2 (two) times daily.   Yes [provider]  enalapril (VASOTEC) 2.5 MG tablet Take 2.5 mg by mouth daily. 11/12/21  Yes [provider]  escitalopram (LEXAPRO) 10 MG tablet Take 10 mg by mouth daily. 11/19/21  Yes [provider]  folic acid (FOLVITE) 1 MG tablet Take 1 mg by mouth daily.   Yes [provider]  levothyroxine (SYNTHROID) 125 MCG tablet Take 125 mcg by mouth every morning. 01/24/21  Yes [provider]  oxyCODONE-acetaminophen (PERCOCET/ROXICET)  5-325 MG tablet Take 1 tablet by mouth 4 (four) times daily as needed for moderate pain. 01/02/22  Yes [provider]  risperiDONE (RISPERDAL) 0.25 MG tablet Take 0.25 mg by mouth 2 (two) times daily as needed. 10/26/21  Yes [provider]  simvastatin (ZOCOR) 40 MG tablet Take 40 mg by mouth at bedtime. 07/22/13  Yes [provider]  acetaminophen (TYLENOL) 500 MG tablet Take 1,000 mg by mouth 3 (three) times daily as needed for mild pain.    [provider]  calcium carbonate (TUMS - DOSED IN MG ELEMENTAL CALCIUM) 500 MG chewable tablet Chew 1 tablet by mouth 2 (two) times daily as needed for  indigestion or heartburn.    [provider]  Menthol, Topical Analgesic, (ICY HOT ADVANCED RELIEF EX) Apply 1 Application topically as needed.    [provider]  Multiple Vitamins-Minerals (PRESERVISION/LUTEIN) CAPS Take by mouth. Patient not taking: Reported on 10/18/2019    [provider]   CT Head Wo Contrast  Result Date: 01/23/2022 CLINICAL DATA:  Fall head injury EXAM: CT HEAD WITHOUT CONTRAST CT CERVICAL SPINE WITHOUT CONTRAST TECHNIQUE: Multidetector CT imaging of the head and cervical spine was performed following the standard protocol without intravenous contrast. Multiplanar CT image reconstructions of the cervical spine were also generated. RADIATION DOSE REDUCTION: This exam was performed according to the departmental dose-optimization program which includes automated exposure control, adjustment of the mA and/or kV according to patient size and/or use of iterative reconstruction technique. COMPARISON:  12/06/2021 FINDINGS: CT HEAD FINDINGS Brain: No evidence of acute infarction, hemorrhage, hydrocephalus, extra-axial collection or mass lesion/mass effect. Periventricular and deep white matter hypodensity. Mild global cerebral volume loss. Vascular: No hyperdense vessel or unexpected calcification. Skull: Normal. Negative for fracture or focal lesion. Sinuses/Orbits: No acute finding. Other: None. CT CERVICAL SPINE FINDINGS Alignment: Degenerative straightening of the normal cervical lordosis. Skull base and vertebrae: No acute fracture. No primary bone lesion or focal pathologic process. Soft tissues and spinal canal: No prevertebral fluid or swelling. No visible canal hematoma. Disc levels: Moderate to severe multilevel disc space height loss and osteophytosis throughout the cervical spine, worst at C6-C7. Upper chest: Negative. Other: None. IMPRESSION: 1. No acute intracranial pathology. Small-vessel white matter disease and mild global cerebral volume loss. 2. No  fracture or static subluxation of the cervical spine. 3. Moderate to severe multilevel disc degenerative disease throughout the cervical spine, worst at C6-C7. Electronically Signed   By: Delanna Ahmadi M.D.   On: 01/23/2022 18:48   CT Cervical Spine Wo Contrast  Result Date: 01/23/2022 CLINICAL DATA:  Fall head injury EXAM: CT HEAD WITHOUT CONTRAST CT CERVICAL SPINE WITHOUT CONTRAST TECHNIQUE: Multidetector CT imaging of the head and cervical spine was performed following the standard protocol without intravenous contrast. Multiplanar CT image reconstructions of the cervical spine were also generated. RADIATION DOSE REDUCTION: This exam was performed according to the departmental dose-optimization program which includes automated exposure control, adjustment of the mA and/or kV according to patient size and/or use of iterative reconstruction technique. COMPARISON:  12/06/2021 FINDINGS: CT HEAD FINDINGS Brain: No evidence of acute infarction, hemorrhage, hydrocephalus, extra-axial collection or mass lesion/mass effect. Periventricular and deep white matter hypodensity. Mild global cerebral volume loss. Vascular: No hyperdense vessel or unexpected calcification. Skull: Normal. Negative for fracture or focal lesion. Sinuses/Orbits: No acute finding. Other: None. CT CERVICAL SPINE FINDINGS Alignment: Degenerative straightening of the normal cervical lordosis. Skull base and vertebrae: No acute fracture. No primary bone lesion or  focal pathologic process. Soft tissues and spinal canal: No prevertebral fluid or swelling. No visible canal hematoma. Disc levels: Moderate to severe multilevel disc space height loss and osteophytosis throughout the cervical spine, worst at C6-C7. Upper chest: Negative. Other: None. IMPRESSION: 1. No acute intracranial pathology. Small-vessel white matter disease and mild global cerebral volume loss. 2. No fracture or static subluxation of the cervical spine. 3. Moderate to severe  multilevel disc degenerative disease throughout the cervical spine, worst at C6-C7. Electronically Signed   By: Delanna Ahmadi M.D.   On: 01/23/2022 18:48   DG Shoulder Left  Result Date: 01/23/2022 CLINICAL DATA:  Fall, deformity EXAM: LEFT SHOULDER - 2+ VIEW COMPARISON:  None Available. FINDINGS: Impacted, angulated, comminuted fractures of the proximal left humerus, involving at least the left surgical neck. Glenohumeral and acromioclavicular joints appear to be in anatomic apposition. Partially imaged left chest is unremarkable. IMPRESSION: Impacted, angulated, comminuted fractures of the proximal left humerus, involving at least the left surgical neck. Glenohumeral and acromioclavicular joints appear to be in anatomic apposition. Consider CT to further evaluate proximal humeral fracture anatomy. Electronically Signed   By: Delanna Ahmadi M.D.   On: 01/23/2022 18:41    Positive ROS: All other systems have been reviewed and were otherwise negative with the exception of those mentioned in the HPI and as above.  Physical Exam: General: Alert, no acute distress Cardiovascular: No pedal edema Respiratory: No cyanosis, no use of accessory musculature GI: No organomegaly, abdomen is soft and non-tender Skin: No lesions in the area of chief complaint Neurologic: Sensation intact distally Psychiatric: Patient is competent for consent with normal mood and affect Lymphatic: No axillary or cervical lymphadenopathy  MUSCULOSKELETAL: The patient is alert and awake.  She is semisitting in bed.  She has swelling and some bruising around the proximal left shoulder.  Small skin tear at the elbow.  This is bandaged.  Neurovascular status is good distally.  Both legs have good motion without pain today.  The right upper extremity is normal.  Back is nontender.  Assessment: Proximal left humerus fracture  Plan: Continue with sling.   Surgery usually not necessary. She will need skilled nursing rehab  again. She may weight-bear as tolerated on right.  If she is unable to then she may need to be bed to chair for a while.    Park Breed, MD [phone]   01/24/2022 11:32 AM

## 2022-01-25 DIAGNOSIS — E039 Hypothyroidism, unspecified: Secondary | ICD-10-CM | POA: Diagnosis not present

## 2022-01-25 DIAGNOSIS — D649 Anemia, unspecified: Secondary | ICD-10-CM | POA: Diagnosis present

## 2022-01-25 DIAGNOSIS — K5900 Constipation, unspecified: Secondary | ICD-10-CM | POA: Diagnosis not present

## 2022-01-25 DIAGNOSIS — Z79890 Hormone replacement therapy (postmenopausal): Secondary | ICD-10-CM | POA: Diagnosis not present

## 2022-01-25 DIAGNOSIS — S3289XA Fracture of other parts of pelvis, initial encounter for closed fracture: Secondary | ICD-10-CM | POA: Diagnosis not present

## 2022-01-25 DIAGNOSIS — Z888 Allergy status to other drugs, medicaments and biological substances status: Secondary | ICD-10-CM | POA: Diagnosis not present

## 2022-01-25 DIAGNOSIS — E876 Hypokalemia: Secondary | ICD-10-CM | POA: Diagnosis present

## 2022-01-25 DIAGNOSIS — Z66 Do not resuscitate: Secondary | ICD-10-CM | POA: Diagnosis present

## 2022-01-25 DIAGNOSIS — R197 Diarrhea, unspecified: Secondary | ICD-10-CM | POA: Diagnosis present

## 2022-01-25 DIAGNOSIS — S42202D Unspecified fracture of upper end of left humerus, subsequent encounter for fracture with routine healing: Secondary | ICD-10-CM | POA: Diagnosis not present

## 2022-01-25 DIAGNOSIS — S42202A Unspecified fracture of upper end of left humerus, initial encounter for closed fracture: Secondary | ICD-10-CM | POA: Diagnosis present

## 2022-01-25 DIAGNOSIS — Z79899 Other long term (current) drug therapy: Secondary | ICD-10-CM | POA: Diagnosis not present

## 2022-01-25 DIAGNOSIS — Z7982 Long term (current) use of aspirin: Secondary | ICD-10-CM | POA: Diagnosis not present

## 2022-01-25 DIAGNOSIS — D696 Thrombocytopenia, unspecified: Secondary | ICD-10-CM | POA: Diagnosis present

## 2022-01-25 DIAGNOSIS — E7800 Pure hypercholesterolemia, unspecified: Secondary | ICD-10-CM | POA: Diagnosis not present

## 2022-01-25 DIAGNOSIS — E871 Hypo-osmolality and hyponatremia: Secondary | ICD-10-CM | POA: Diagnosis present

## 2022-01-25 DIAGNOSIS — Z743 Need for continuous supervision: Secondary | ICD-10-CM | POA: Diagnosis not present

## 2022-01-25 DIAGNOSIS — Z9181 History of falling: Secondary | ICD-10-CM | POA: Diagnosis not present

## 2022-01-25 DIAGNOSIS — Z720 Tobacco use: Secondary | ICD-10-CM | POA: Diagnosis not present

## 2022-01-25 DIAGNOSIS — I129 Hypertensive chronic kidney disease with stage 1 through stage 4 chronic kidney disease, or unspecified chronic kidney disease: Secondary | ICD-10-CM | POA: Diagnosis not present

## 2022-01-25 DIAGNOSIS — W19XXXA Unspecified fall, initial encounter: Secondary | ICD-10-CM | POA: Diagnosis not present

## 2022-01-25 DIAGNOSIS — R296 Repeated falls: Secondary | ICD-10-CM | POA: Diagnosis present

## 2022-01-25 DIAGNOSIS — S32401D Unspecified fracture of right acetabulum, subsequent encounter for fracture with routine healing: Secondary | ICD-10-CM | POA: Diagnosis not present

## 2022-01-25 DIAGNOSIS — N1832 Chronic kidney disease, stage 3b: Secondary | ICD-10-CM | POA: Diagnosis not present

## 2022-01-25 DIAGNOSIS — W19XXXD Unspecified fall, subsequent encounter: Secondary | ICD-10-CM | POA: Diagnosis not present

## 2022-01-25 DIAGNOSIS — D631 Anemia in chronic kidney disease: Secondary | ICD-10-CM | POA: Diagnosis not present

## 2022-01-25 DIAGNOSIS — W010XXA Fall on same level from slipping, tripping and stumbling without subsequent striking against object, initial encounter: Secondary | ICD-10-CM | POA: Diagnosis present

## 2022-01-25 DIAGNOSIS — H9190 Unspecified hearing loss, unspecified ear: Secondary | ICD-10-CM | POA: Diagnosis present

## 2022-01-25 DIAGNOSIS — F17200 Nicotine dependence, unspecified, uncomplicated: Secondary | ICD-10-CM | POA: Diagnosis present

## 2022-01-25 DIAGNOSIS — I714 Abdominal aortic aneurysm, without rupture, unspecified: Secondary | ICD-10-CM | POA: Diagnosis not present

## 2022-01-25 DIAGNOSIS — Z7401 Bed confinement status: Secondary | ICD-10-CM | POA: Diagnosis not present

## 2022-01-25 DIAGNOSIS — Y92009 Unspecified place in unspecified non-institutional (private) residence as the place of occurrence of the external cause: Secondary | ICD-10-CM | POA: Diagnosis not present

## 2022-01-25 LAB — IRON AND TIBC
Iron: 26 ug/dL — ABNORMAL LOW (ref 28–170)
Saturation Ratios: 14 % (ref 10.4–31.8)
TIBC: 186 ug/dL — ABNORMAL LOW (ref 250–450)
UIBC: 160 ug/dL

## 2022-01-25 LAB — C DIFFICILE QUICK SCREEN W PCR REFLEX
C Diff antigen: NEGATIVE
C Diff interpretation: NOT DETECTED
C Diff toxin: NEGATIVE

## 2022-01-25 LAB — CBC
HCT: 19 % — ABNORMAL LOW (ref 36.0–46.0)
Hemoglobin: 6.3 g/dL — ABNORMAL LOW (ref 12.0–15.0)
MCH: 32.3 pg (ref 26.0–34.0)
MCHC: 33.2 g/dL (ref 30.0–36.0)
MCV: 97.4 fL (ref 80.0–100.0)
Platelets: 88 10*3/uL — ABNORMAL LOW (ref 150–400)
RBC: 1.95 MIL/uL — ABNORMAL LOW (ref 3.87–5.11)
RDW: 13.2 % (ref 11.5–15.5)
WBC: 8.8 10*3/uL (ref 4.0–10.5)
nRBC: 0 % (ref 0.0–0.2)

## 2022-01-25 LAB — BASIC METABOLIC PANEL
Anion gap: 5 (ref 5–15)
BUN: 23 mg/dL (ref 8–23)
CO2: 22 mmol/L (ref 22–32)
Calcium: 9 mg/dL (ref 8.9–10.3)
Chloride: 107 mmol/L (ref 98–111)
Creatinine, Ser: 1.36 mg/dL — ABNORMAL HIGH (ref 0.44–1.00)
GFR, Estimated: 39 mL/min — ABNORMAL LOW (ref >60)
Glucose, Bld: 75 mg/dL (ref 70–99)
Potassium: 3.7 mmol/L (ref 3.5–5.1)
Sodium: 134 mmol/L — ABNORMAL LOW (ref 135–145)

## 2022-01-25 LAB — FOLATE: Folate: 15 ng/mL (ref >5.9)

## 2022-01-25 LAB — RETICULOCYTES
Immature Retic Fract: 21.7 % — ABNORMAL HIGH (ref 2.3–15.9)
RBC.: 1.94 MIL/uL — ABNORMAL LOW (ref 3.87–5.11)
Retic Count, Absolute: 46 10*3/uL (ref 19.0–186.0)
Retic Ct Pct: 2.4 % (ref 0.4–3.1)

## 2022-01-25 LAB — VITAMIN B12: Vitamin B-12: 104 pg/mL — ABNORMAL LOW (ref 180–914)

## 2022-01-25 LAB — HEMOGLOBIN AND HEMATOCRIT, BLOOD
HCT: 23.7 % — ABNORMAL LOW (ref 36.0–46.0)
Hemoglobin: 7.9 g/dL — ABNORMAL LOW (ref 12.0–15.0)

## 2022-01-25 LAB — OCCULT BLOOD X 1 CARD TO LAB, STOOL: Fecal Occult Bld: NEGATIVE

## 2022-01-25 LAB — PREPARE RBC (CROSSMATCH)

## 2022-01-25 LAB — FERRITIN: Ferritin: 111 ng/mL (ref 11–307)

## 2022-01-25 MED ORDER — SODIUM CHLORIDE 0.9% IV SOLUTION: Freq: Once | INTRAVENOUS | Status: DC

## 2022-01-25 MED ORDER — DIPHENHYDRAMINE HCL 25 MG PO CAPS
25.0000 mg | ORAL_CAPSULE | Freq: Once | ORAL | Status: AC
  Administered 2022-01-25: 25 mg via ORAL
  Filled 2022-01-25: qty 1

## 2022-01-25 MED ORDER — ACETAMINOPHEN 325 MG PO TABS
650.0000 mg | ORAL_TABLET | Freq: Once | ORAL | Status: AC
  Administered 2022-01-25: 650 mg via ORAL
  Filled 2022-01-25: qty 2

## 2022-01-25 NOTE — Progress Notes (Signed)
Subjective:    The patient is awake and alert.  Her nieces are at the bedside and we discussed her progress.  Her hemoglobin is down to 6.3 today and she will receive a unit of blood.  Plans are for her to go back to [facility] skilled nursing.  Patient reports pain as moderate.  Objective:   VITALS:   Vitals:   01/25/22 1000 01/25/22 1127  BP: 118/82 109/77  Pulse: 88 91  Resp: 20 16  Temp: 98.9 F (37.2 C)   SpO2: 95% 96%    Neurologically intact She has a fair amount of ecchymosis in the arm.  These fractures do bleed fairly freely.  LABS Recent Labs    01/23/22 1857 01/24/22 0556 01/25/22 0426  HGB 8.3* 7.3* 6.3*  HCT 25.9* 22.8* 19.0*  WBC 7.8 6.8 8.8  PLT 108* 92* 88*    Recent Labs    01/23/22 1857 01/24/22 0556 01/25/22 0426  NA 134* 133* 134*  K 4.0 3.4* 3.7  BUN 18 19 23   CREATININE 1.33* 1.22* 1.36*  GLUCOSE 111* 102* 75    No results for input(s): "LABPT", "INR" in the last 72 hours.   Assessment/Plan:      Up with therapy Transfer to skilled nursing facility when available.   May weight-bear as tolerated right side. No weight on left arm. Follow-up in my office in 10 days from discharge.

## 2022-01-25 NOTE — TOC Progression Note (Addendum)
Transition of Care [facility]) - Progression Note    Patient Details  Name: Bianca Horton MRN: [id number] Date of Birth: Apr 01, 1935  Transition of Care [facility]) CM/SW [place], RN Phone Number: 01/25/2022, 8:32 AM  Clinical Narrative:   Patient left SNF on Oct 27  and is in Copay days, her copay to go to [facility] is $196 per day, 14 days has to be paid up froNT, Equaling $2744   Met with the patient and explained to her about the cost,  She stated that she is not able to pay that up front and would need to find a facility that can take payments I Notified Magda Paganini at [facility] that she can not pay the copay up front  Expected Discharge Plan: [place] Barriers to Discharge: SNF Pending bed offer, Insurance Authorization  Expected Discharge Plan and Services Expected Discharge Plan: [place] arrangements for the past 2 months: Assisted Living Facility                                       Social Determinants of Health (SDOH) Interventions    Readmission Risk Interventions     No data to display

## 2022-01-25 NOTE — Progress Notes (Signed)
PROGRESS NOTE Bianca INGBER  PPJ:[id number] DOB: 29-Aug-1935 DOA: 01/23/2022 PCP: Margaretann Loveless, MD   Brief Narrative/Hospital Course: 86 year old female with past medical history of hypertension, hyperlipidemia, hypothyroidism, stage IIIb CKD, chronic anemia/thrombocytopenia, abdominal aortic aneurysm who presented with complaint of left shoulder pain after a fall at ALF while walking with a walker.  X-ray showed displaced proximal left humeral fracture.  She just had right pelvic fracture on month ago.  Orthopedics consulted.  Recommended conservative management with sling, outpatient follow-up with orthopedics    Subjective: Seen and examined this morning. Alert awake oriented not in distress H/h low 6.3< 7.3<8.3-  has been drifting Platelet count at 88<92<108 Afebrile overnight Creat ~ 1.3 holding there  BP 80-90s yesterday afternoon   Assessment and Plan: Principal Problem:   Closed fracture of proximal end of left humerus, unspecified fracture morphology, initial encounter Active Problems:   Essential hypertension   Stage 3b chronic kidney disease (CKD) (HCC)   Hypothyroidism   Thrombocytopenia (HCC)   Closed fracture of left proximal humerus   Left humeral fracture: Secondary to fall after her walker got caught up.  Seen by Dr. Hyacinth Meeker from orthopedics advised nonoperative management continue sling.  Continue pain control, continue PT OT. In the ED underwent extensive x-ray, CT head C-spine without other injuries.  Patient family would like to discuss with orthopedic regarding surgery today  Hypertension: Soft overnight.  Continue to hold home Coreg and enalapril.   CKD stage IIIb: Remains stable at baseline   Chronic normocytic anemia/thrombocytopenia: Has chronic normocytic anemia with baseline hemoglobin ranges from 7-8.  This morning hemoglobin drifted to 6.3 g, discussed with the patient risk benefits alternative for blood transfusion and agreeable, ordered 1 unit PRBC.   Ordered anemia panel and FOBT   Hypothyroidism:cont Synthyroid Hypokalemia: Resolved.   Frequent falls/deconditioning: Currently in ALF, continue PT OT recommending skilled nursing facility  Diarrhea check for C. difficile Goals of care currently DNR.  DVT prophylaxis: heparin injection 5,000 Units Start: 01/23/22 2200 Code Status:   Code Status: DNR Family Communication: plan of care discussed with patient/ I updated her nieces at bedside> they had some questions for discharge home.  Patient status is: Inpatient because of fall with humerus fracture now with anemia Level of care: Med-Surg  Dispo: The patient is from: ALF            Anticipated disposition: snf once hemoglobin stable and orthopedics CLEARS  Objective: Vitals last 24 hrs: Vitals:   01/24/22 2311 01/25/22 0945 01/25/22 1000 01/25/22 1127  BP: 99/60 99/81 118/82 109/77  Pulse: 99 99 88 91  Resp: 18 20 20 16   Temp: 98.9 F (37.2 C) 98.8 F (37.1 C) 98.9 F (37.2 C)   TempSrc: Oral Oral Oral   SpO2: 94% 96% 95% 96%  Weight:      Height:       Weight change:   Physical Examination: General exam: alert awake, older than stated age HEENT:Oral mucosa moist, Ear/Nose WNL grossly Respiratory system: bilaterally CLEAR BS, no use of accessory muscle Cardiovascular system: S1 & S2 +,No JVD. Gastrointestinal system: Abdomen soft,NT,ND,BS+ Nervous System:Alert, awake, moving extremities. Extremities: Bruising her lower extremities, LEFT ARM IN SLING Skin: No rashes,no icterus. MSK: Normal muscle bulk,tone, power  Medications reviewed:  Scheduled Meds:  sodium chloride   Intravenous Once   aspirin  325 mg Oral Daily   buPROPion  150 mg Oral Daily   cilostazol  100 mg Oral BID   escitalopram  10 mg  Oral Daily   heparin  5,000 Units Subcutaneous Q8H   levothyroxine  125 mcg Oral Q0600   simvastatin  40 mg Oral q1800  Continuous Infusions:  sodium chloride 75 mL/hr at 01/25/22 0701    Diet Order              Diet regular Room service appropriate? Yes; Fluid consistency: Thin  Diet effective now                  Intake/Output Summary (Last 24 hours) at 01/25/2022 1147 Last data filed at 01/25/2022 0701 Gross per 24 hour  Intake 726.5 ml  Output --  Net 726.5 ml   Net IO Since Admission: 966.5 mL [01/25/22 1147]  Wt Readings from Last 3 Encounters:  01/23/22 52.2 kg  12/07/21 56.2 kg  06/04/21 61.5 kg     Unresulted Labs (From admission, onward)     Start     Ordered   01/25/22 1024  C Difficile Quick Screen w PCR reflex  (C Difficile quick screen w PCR reflex panel )  Once, for 24 hours,   TIMED       References:    CDiff Information Tool   01/25/22 1025   01/25/22 0738  Vitamin B12  (Anemia Panel (PNL))  Add-on,   AD       Question:  Specimen collection method  Answer:  Lab=Lab collect   01/25/22 0737          Data Reviewed: I have personally reviewed following labs and imaging studies CBC: Recent Labs  Lab 01/23/22 1857 01/24/22 0556 01/25/22 0426  WBC 7.8 6.8 8.8  NEUTROABS 5.2  --   --   HGB 8.3* 7.3* 6.3*  HCT 25.9* 22.8* 19.0*  MCV 100.0 100.0 97.4  PLT 108* 92* 88*   Basic Metabolic Panel: Recent Labs  Lab 01/23/22 1857 01/24/22 0556 01/25/22 0426  NA 134* 133* 134*  K 4.0 3.4* 3.7  CL 105 106 107  CO2 25 22 22   GLUCOSE 111* 102* 75  BUN 18 19 23   CREATININE 1.33* 1.22* 1.36*  CALCIUM 9.6 9.2 9.0  No results found for this or any previous visit (from the past 240 hour(s)).   Culture/Microbiology No results found for: "SDES", "SPECREQUEST", "CULT", "REPTSTATUS"  Radiology Studies: CT Head Wo Contrast  Result Date: 01/23/2022 CLINICAL DATA:  Fall head injury EXAM: CT HEAD WITHOUT CONTRAST CT CERVICAL SPINE WITHOUT CONTRAST TECHNIQUE: Multidetector CT imaging of the head and cervical spine was performed following the standard protocol without intravenous contrast. Multiplanar CT image reconstructions of the cervical spine were also generated.  RADIATION DOSE REDUCTION: This exam was performed according to the departmental dose-optimization program which includes automated exposure control, adjustment of the mA and/or kV according to patient size and/or use of iterative reconstruction technique. COMPARISON:  12/06/2021 FINDINGS: CT HEAD FINDINGS Brain: No evidence of acute infarction, hemorrhage, hydrocephalus, extra-axial collection or mass lesion/mass effect. Periventricular and deep white matter hypodensity. Mild global cerebral volume loss. Vascular: No hyperdense vessel or unexpected calcification. Skull: Normal. Negative for fracture or focal lesion. Sinuses/Orbits: No acute finding. Other: None. CT CERVICAL SPINE FINDINGS Alignment: Degenerative straightening of the normal cervical lordosis. Skull base and vertebrae: No acute fracture. No primary bone lesion or focal pathologic process. Soft tissues and spinal canal: No prevertebral fluid or swelling. No visible canal hematoma. Disc levels: Moderate to severe multilevel disc space height loss and osteophytosis throughout the cervical spine, worst at C6-C7. Upper chest: Negative. Other:  None. IMPRESSION: 1. No acute intracranial pathology. Small-vessel white matter disease and mild global cerebral volume loss. 2. No fracture or static subluxation of the cervical spine. 3. Moderate to severe multilevel disc degenerative disease throughout the cervical spine, worst at C6-C7. Electronically Signed   By: Jearld Lesch M.D.   On: 01/23/2022 18:48   CT Cervical Spine Wo Contrast  Result Date: 01/23/2022 CLINICAL DATA:  Fall head injury EXAM: CT HEAD WITHOUT CONTRAST CT CERVICAL SPINE WITHOUT CONTRAST TECHNIQUE: Multidetector CT imaging of the head and cervical spine was performed following the standard protocol without intravenous contrast. Multiplanar CT image reconstructions of the cervical spine were also generated. RADIATION DOSE REDUCTION: This exam was performed according to the departmental  dose-optimization program which includes automated exposure control, adjustment of the mA and/or kV according to patient size and/or use of iterative reconstruction technique. COMPARISON:  12/06/2021 FINDINGS: CT HEAD FINDINGS Brain: No evidence of acute infarction, hemorrhage, hydrocephalus, extra-axial collection or mass lesion/mass effect. Periventricular and deep white matter hypodensity. Mild global cerebral volume loss. Vascular: No hyperdense vessel or unexpected calcification. Skull: Normal. Negative for fracture or focal lesion. Sinuses/Orbits: No acute finding. Other: None. CT CERVICAL SPINE FINDINGS Alignment: Degenerative straightening of the normal cervical lordosis. Skull base and vertebrae: No acute fracture. No primary bone lesion or focal pathologic process. Soft tissues and spinal canal: No prevertebral fluid or swelling. No visible canal hematoma. Disc levels: Moderate to severe multilevel disc space height loss and osteophytosis throughout the cervical spine, worst at C6-C7. Upper chest: Negative. Other: None. IMPRESSION: 1. No acute intracranial pathology. Small-vessel white matter disease and mild global cerebral volume loss. 2. No fracture or static subluxation of the cervical spine. 3. Moderate to severe multilevel disc degenerative disease throughout the cervical spine, worst at C6-C7. Electronically Signed   By: Jearld Lesch M.D.   On: 01/23/2022 18:48   DG Shoulder Left  Result Date: 01/23/2022 CLINICAL DATA:  Fall, deformity EXAM: LEFT SHOULDER - 2+ VIEW COMPARISON:  None Available. FINDINGS: Impacted, angulated, comminuted fractures of the proximal left humerus, involving at least the left surgical neck. Glenohumeral and acromioclavicular joints appear to be in anatomic apposition. Partially imaged left chest is unremarkable. IMPRESSION: Impacted, angulated, comminuted fractures of the proximal left humerus, involving at least the left surgical neck. Glenohumeral and  acromioclavicular joints appear to be in anatomic apposition. Consider CT to further evaluate proximal humeral fracture anatomy. Electronically Signed   By: Jearld Lesch M.D.   On: 01/23/2022 18:41     LOS: 0 days   Lanae Boast, MD [facility]  01/25/2022, 11:47 AM

## 2022-01-25 NOTE — Progress Notes (Signed)
OT Cancellation Note  Patient Details Name: Bianca Horton MRN: [id number] DOB: Feb 15, 1936   Cancelled Treatment:    Reason Eval/Treat Not Completed: Medical issues which prohibited therapy. Consult received, chart reviewed. Pt noted with drop in Hgb to 6.3, pending blood transfusion. Will re-attempt OT evaluation at later date/time as pt is medically appropriate.   Arman Filter., MPH, MS, OTR/L ascom [phone] 01/25/22, 8:24 AM

## 2022-01-25 NOTE — Progress Notes (Signed)
Consent obtained for blood product transfusion and placed in Pt's paper chart.

## 2022-01-25 NOTE — TOC Progression Note (Signed)
Transition of Care [facility]) - Progression Note    Patient Details  Name: Bianca Horton MRN: [id number] Date of Birth: 05-31-1935  [facility]) CM/SW Contact  Marlowe Sax, RN Phone Number: 01/25/2022, 12:42 PM  Clinical Narrative:    The patient will go to [facility] at [place], once she is able to work with PT, Ins process will need to be started to get approval   Expected Discharge Plan: Skilled Nursing Facility Barriers to Discharge: SNF Pending bed offer, Insurance Authorization  Expected Discharge Plan and Services Expected Discharge Plan: Skilled Nursing Facility       Living arrangements for the past 2 months: Assisted Living Facility                                       Social Determinants of Health (SDOH) Interventions    Readmission Risk Interventions     No data to display

## 2022-01-25 NOTE — Plan of Care (Signed)

## 2022-01-25 NOTE — Progress Notes (Signed)
PT Cancellation Note  Patient Details Name: Bianca Horton MRN: [id number] DOB: May 23, 1935   Cancelled Treatment:    Reason Eval/Treat Not Completed: Medical issues which prohibited therapy (Hb drop to 6.3 today. Will defer treatment until later date/time.)   8:22 AM, 01/25/22 Bianca Horton, PT, DPT Physical Therapist - [facility]  [phone] (ASCOM)    Bianca Horton 01/25/2022, 8:22 AM

## 2022-01-25 NOTE — TOC Progression Note (Signed)
Transition of Care [facility]) - Progression Note    Patient Details  Name: Bianca Horton MRN: [id number] Date of Birth: 28-Apr-1935  Transition of Care [facility]) CM/SW Contact  Marlowe Sax, RN Phone Number: 01/25/2022, 9:34 AM  Clinical Narrative:   Spoke with the patient;s Niece Bianca Horton asked if [facility] could take a credit card for payment, I provided her with [facility] number  She will call [facility] and speak to [place] and let me know what is decided Due to HgB being low she will not likely to [place] until Monday, Will need to confirm bed offer with the copay and get Ins approval Also waiting on being medically ready in order to start Ins auth process, must work with PT prior to starting    Expected Discharge Plan: Skilled Nursing Facility Barriers to Discharge: SNF Pending bed offer, Insurance Authorization  Expected Discharge Plan and Services Expected Discharge Plan: Skilled Nursing Facility       Living arrangements for the past 2 months: Assisted Living Facility                                       Social Determinants of Health (SDOH) Interventions    Readmission Risk Interventions     No data to display

## 2022-01-26 LAB — CBC
HCT: 31.1 % — ABNORMAL LOW (ref 36.0–46.0)
Hemoglobin: 10.5 g/dL — ABNORMAL LOW (ref 12.0–15.0)
MCH: 31.7 pg (ref 26.0–34.0)
MCHC: 33.8 g/dL (ref 30.0–36.0)
MCV: 94 fL (ref 80.0–100.0)
Platelets: 92 10*3/uL — ABNORMAL LOW (ref 150–400)
RBC: 3.31 MIL/uL — ABNORMAL LOW (ref 3.87–5.11)
RDW: 14.8 % (ref 11.5–15.5)
WBC: 9.7 10*3/uL (ref 4.0–10.5)
nRBC: 0 % (ref 0.0–0.2)

## 2022-01-26 LAB — BPAM RBC
Blood Product Expiration Date: 202312012359
ISSUE DATE / TIME: 202311240954
Unit Type and Rh: 600

## 2022-01-26 LAB — TYPE AND SCREEN
ABO/RH(D): A POS
Antibody Screen: NEGATIVE
Unit division: 0

## 2022-01-26 LAB — BASIC METABOLIC PANEL
Anion gap: 9 (ref 5–15)
BUN: 19 mg/dL (ref 8–23)
CO2: 20 mmol/L — ABNORMAL LOW (ref 22–32)
Calcium: 9.5 mg/dL (ref 8.9–10.3)
Chloride: 107 mmol/L (ref 98–111)
Creatinine, Ser: 1.06 mg/dL — ABNORMAL HIGH (ref 0.44–1.00)
GFR, Estimated: 52 mL/min — ABNORMAL LOW (ref >60)
Glucose, Bld: 114 mg/dL — ABNORMAL HIGH (ref 70–99)
Potassium: 3.6 mmol/L (ref 3.5–5.1)
Sodium: 136 mmol/L (ref 135–145)

## 2022-01-26 MED ORDER — HYDRALAZINE HCL 20 MG/ML IJ SOLN
10.0000 mg | Freq: Four times a day (QID) | INTRAMUSCULAR | Status: DC | PRN
  Administered 2022-01-26 – 2022-01-27 (×2): 10 mg via INTRAVENOUS
  Filled 2022-01-26 (×2): qty 1

## 2022-01-26 NOTE — Progress Notes (Signed)
PROGRESS NOTE    Bianca Horton  HMC:[id number] DOB: Dec 11, 1935 DOA: 01/23/2022 PCP: Margaretann Loveless, MD   Brief Narrative:  86 year old female with past medical history of hypertension, hyperlipidemia, hypothyroidism, stage IIIb CKD, chronic anemia/thrombocytopenia, abdominal aortic aneurysm who presented with complaint of left shoulder pain after a fall at ALF while walking with a walker.  X-ray showed displaced proximal left humeral fracture.  She just had right pelvic fracture on month ago.  Orthopedics consulted.  Recommended conservative management with sling, outpatient follow-up with orthopedics    Assessment & Plan:   Expand All Collapse All   PROGRESS NOTE [name]           MOQ:[id number] DOB: [birth year] DOA: 01/23/2022 PCP: Margaretann Loveless, MD       Brief Narrative/Hospital Course: 86 year old female with past medical history of hypertension, hyperlipidemia, hypothyroidism, stage IIIb CKD, chronic anemia/thrombocytopenia, abdominal aortic aneurysm who presented with complaint of left shoulder pain after a fall at ALF while walking with a walker.  X-ray showed displaced proximal left humeral fracture.  She just had right pelvic fracture on month ago.  Orthopedics consulted.  Recommended conservative management with sling, outpatient follow-up with orthopedics    Subjective: Seen and examined this morning. Alert awake oriented not in distress H/h low 6.3< 7.3<8.3-  has been drifting Platelet count at 88<92<108 Afebrile overnight Creat ~ 1.3 holding there  BP 80-90s yesterday afternoon   Assessment and Plan:   Left humeral fracture:  -Secondary to fall after her walker got caught up.  Seen by Dr. Hyacinth Meeker from orthopedics advised nonoperative management.  Continue sling.   -Continue as needed pain control -In the ED underwent extensive x-ray, CT head C-spine without other injuries.  -Evaluated by OT recommended SNF.   -Hemoglobin remained stable.  Neurovascular  status improved. Discharged to SNF when available.  Follow-up with orthopedic in 10 days to 2 weeks outpatient.    Hypertension: Blood pressure is labile.  Continue to hold home Coreg and enalapril.  -DC IV fluids.  Hydralazine as needed.  CKD stage IIIb: Remains stable at baseline    Chronic normocytic anemia/thrombocytopenia: Has chronic normocytic anemia with baseline hemoglobin ranges from 7-8.   -Hemoglobin dropped to 6.3.  Status post 1 unit PRBC transfusion on 01/25/2022.  Hemoglobin improved to 10.5.  Platelet count remained at baseline.  Hypothyroidism:cont Synthyroid  Hypokalemia: Resolved.    Frequent falls/deconditioning: Currently in ALF, continue PT OT recommending skilled nursing facility.  TOC consulted.  Diarrhea check for C. Difficile  HLD: Continue statins  Goals of care currently DNR.   DVT prophylaxis: heparin injection 5,000 Units Start: 01/23/22 2200 Code Status:   Code Status: DNR Family Communication: plan of care discussed with patient   Dispo: The patient is from: ALF          Anticipated disposition: snf  when bed available.    DVT prophylaxis: heparin Code Status: DNR Family Communication:  None present at bedside.  Plan of care discussed with patient in length and she verbalized understanding and agreed with it. Disposition Plan: SNF  Consultants:  Ortho surgery  Procedures:  None  Antimicrobials:  None  Status is: Inpatient     Subjective: Patient seen and examined.  Resting comfortably on the bed.  Hard of hearing.  Denies any new complaints today.  Remained afebrile.  No acute events overnight.  Objective: Vitals:   01/26/22 0032 01/26/22 0045 01/26/22 0842 01/26/22 1008  BP: (!) 176/82 135/75 (!) 182/86 Marland Kitchen)  113/55  Pulse: 98 85 (!) 101 98  Resp: 16 17 18    Temp: 98 F (36.7 C) 98 F (36.7 C) 98.2 F (36.8 C)   TempSrc:   Oral   SpO2: 95%  98%   Weight:      Height:        Intake/Output Summary (Last 24 hours) at  01/26/2022 1342 Last data filed at 01/26/2022 01/28/2022 Gross per 24 hour  Intake 223 ml  Output 2880 ml  Net -2657 ml   Filed Weights   01/23/22 1827  Weight: 52.2 kg    Examination:  General exam: Appears calm and comfortable, elderly white female, lying comfortably on the bed.  On room air, hard of hearing Respiratory system: Clear to auscultation. Respiratory effort normal. Cardiovascular system: S1 & S2 heard, RRR. No JVD, murmurs, rubs, gallops or clicks. No pedal edema. Gastrointestinal system: Abdomen is nondistended, soft and nontender. No organomegaly or masses felt. Normal bowel sounds heard. Central nervous system: Alert and oriented. No focal neurological deficits. Extremities: Bruising on both lower extremities and left arm in sling.  Able to move left hand without any issues. Skin: No rashes, lesions or ulcers Psychiatry: Judgement and insight appear normal. Mood & affect appropriate.    Data Reviewed: I have personally reviewed following labs and imaging studies  CBC: Recent Labs  Lab 01/23/22 1857 01/24/22 0556 01/25/22 0426 01/25/22 1452 01/26/22 0923  WBC 7.8 6.8 8.8  --  9.7  NEUTROABS 5.2  --   --   --   --   HGB 8.3* 7.3* 6.3* 7.9* 10.5*  HCT 25.9* 22.8* 19.0* 23.7* 31.1*  MCV 100.0 100.0 97.4  --  94.0  PLT 108* 92* 88*  --  92*   Basic Metabolic Panel: Recent Labs  Lab 01/23/22 1857 01/24/22 0556 01/25/22 0426 01/26/22 0923  NA 134* 133* 134* 136  K 4.0 3.4* 3.7 3.6  CL 105 106 107 107  CO2 25 22 22  20*  GLUCOSE 111* 102* 75 114*  BUN 18 19 23 19   CREATININE 1.33* 1.22* 1.36* 1.06*  CALCIUM 9.6 9.2 9.0 9.5   GFR: Estimated Creatinine Clearance: 33.1 mL/min (A) (by C-G formula based on SCr of 1.06 mg/dL (H)). Liver Function Tests: No results for input(s): "AST", "ALT", "ALKPHOS", "BILITOT", "PROT", "ALBUMIN" in the last 168 hours. No results for input(s): "LIPASE", "AMYLASE" in the last 168 hours. No results for input(s): "AMMONIA" in  the last 168 hours. Coagulation Profile: No results for input(s): "INR", "PROTIME" in the last 168 hours. Cardiac Enzymes: No results for input(s): "CKTOTAL", "CKMB", "CKMBINDEX", "TROPONINI" in the last 168 hours. BNP (last 3 results) No results for input(s): "PROBNP" in the last 8760 hours. HbA1C: No results for input(s): "HGBA1C" in the last 72 hours. CBG: No results for input(s): "GLUCAP" in the last 168 hours. Lipid Profile: No results for input(s): "CHOL", "HDL", "LDLCALC", "TRIG", "CHOLHDL", "LDLDIRECT" in the last 72 hours. Thyroid Function Tests: No results for input(s): "TSH", "T4TOTAL", "FREET4", "T3FREE", "THYROIDAB" in the last 72 hours. Anemia Panel: Recent Labs    01/25/22 0426 01/25/22 0756  VITAMINB12  --  104*  FOLATE 15.0  --   FERRITIN 111  --   TIBC 186*  --   IRON 26*  --   RETICCTPCT 2.4  --    Sepsis Labs: No results for input(s): "PROCALCITON", "LATICACIDVEN" in the last 168 hours.  Recent Results (from the past 240 hour(s))  C Difficile Quick Screen w PCR reflex  Status: None   Collection Time: 01/25/22  9:32 PM   Specimen: STOOL  Result Value Ref Range Status   C Diff antigen NEGATIVE NEGATIVE Final   C Diff toxin NEGATIVE NEGATIVE Final   C Diff interpretation No C. difficile detected.  Final    Comment: Performed at [facility], [address] [place], [place] [address]      Radiology Studies: No results found.  Scheduled Meds:  sodium chloride   Intravenous Once   aspirin  325 mg Oral Daily   buPROPion  150 mg Oral Daily   cilostazol  100 mg Oral BID   escitalopram  10 mg Oral Daily   heparin  5,000 Units Subcutaneous Q8H   levothyroxine  125 mcg Oral Q0600   simvastatin  40 mg Oral q1800   Continuous Infusions:  sodium chloride 75 mL/hr at 01/26/22 0031     LOS: 1 day   Time spent: 35 minutes   Brendalyn Vallely Estill Cotta, MD [facility]  If 7PM-7AM, please contact night-coverage [email or web address] 01/26/2022,  1:42 PM

## 2022-01-26 NOTE — Evaluation (Signed)
Occupational Therapy Evaluation Patient Details Name: Bianca Horton MRN: [id number] DOB: 10/22/1935 Today's Date: 01/26/2022   History of Present Illness Bianca Horton is an 85yoF who presents after mechanical fall at facility while using walker, workup revealing of Left humeral deformity. Pt was here in 2nd week October after a fall and subsequent fractures to Rt acetabulum, Rt pubic Ramus, Rt sacral Ala, most recently advanced to WBAT BLE. PMH: HTN, HLD, AAA, hypothyroidism, depression, CKD stage III, tobacco abuse. Imaging reports revealing of "impacted, angulated, comminuted fractures of the proximal left humerus, involving at least the left surgical neck. Glenohumeral and acromioclavicular joints appear to be in anatomic apposition." Ortho consult recommending conservative management in sling, NWB.   Clinical Impression   Bianca Horton presents with generalized weakness, limited endurance, pain, and impaired LUE use. She comes to [facility] from [facility]. During today's evaluation, pt reports that she has no pain with laying immobile in bed, but mod-several LUE pain with any movement. She has extensive bruising on LUE, L upper back, and on b/l LE. Pt is able to perform self-feeding and oral care INDly using only her RUE. Pt able to get to EOB with Max A, unable to attempt standing. Provided educ re: NWB status, pt verbalizes understanding. Anticipate that pt's significant care needs cannot be meet at her ALF, recommend DC to SNF.    Recommendations for follow up therapy are one component of a multi-disciplinary discharge planning process, led by the attending physician.  Recommendations may be updated based on patient status, additional functional criteria and insurance authorization.   Follow Up Recommendations  Skilled nursing-short term rehab (<3 hours/day)     Assistance Recommended at Discharge Frequent or constant Supervision/Assistance  Patient can return home with the following A lot of  help with walking and/or transfers;A lot of help with bathing/dressing/bathroom;Assistance with feeding;Direct supervision/assist for medications management;Assist for transportation;Assistance with cooking/housework    Functional Status Assessment  Patient has had a recent decline in their functional status and demonstrates the ability to make significant improvements in function in a reasonable and predictable amount of time.  Equipment Recommendations  None recommended by OT    Recommendations for Other Services       Precautions / Restrictions Precautions Precautions: Fall Restrictions Weight Bearing Restrictions: Yes LUE Weight Bearing: Non weight bearing RLE Weight Bearing: Weight bearing as tolerated LLE Weight Bearing: Weight bearing as tolerated      Mobility Bed Mobility Overal bed mobility: Needs Assistance Bed Mobility: Supine to Sit, Sit to Supine     Supine to sit: Max assist, HOB elevated Sit to supine: Max assist, HOB elevated        Transfers Overall transfer level: Needs assistance                 General transfer comment: Pt unable/unwilling to attempt standing      Balance Overall balance assessment: Needs assistance Sitting-balance support: Single extremity supported, Feet supported Sitting balance-Leahy Scale: Fair Sitting balance - Comments: Pt complains of pain with sitting, 2/2 small movements of LUE     Standing balance-Leahy Scale: Zero                             ADL either performed or assessed with clinical judgement   ADL Overall ADL's : Needs assistance/impaired Eating/Feeding: Minimal assistance Eating/Feeding Details (indicate cue type and reason): Requires assistance for openin containers, able to feed self INDly using RUE Grooming:  Oral care Grooming Details (indicate cue type and reason): Pt is able to [facility] manage oral care with RUE         Upper Body Dressing : Maximal assistance                            Vision         Perception     Praxis      Pertinent Vitals/Pain Pain Assessment Faces Pain Scale: Hurts little more Pain Location: L arm, w/ movement Pain Descriptors / Indicators: Guarding, Grimacing Pain Intervention(s): Limited activity within patient's tolerance, Premedicated before session     Hand Dominance     Extremity/Trunk Assessment     Lower Extremity Assessment Lower Extremity Assessment: LLE deficits/detail;RLE deficits/detail RLE Deficits / Details: RLE WFL LLE Deficits / Details: s/p L humeral fx, in sling, NWB, extensive bruising. Finger ROM WFL       Communication Communication Communication: HOH   Cognition Arousal/Alertness: Awake/alert   Overall Cognitive Status: Within Functional Limits for tasks assessed                                 General Comments: Pt oriented to self, place, time, situation. Conversation difficult, 2/2 pt's impaired hearing     General Comments       Exercises Other Exercises Other Exercises: Educ re: WB precautions, opening containers/packages with single UE   Shoulder Instructions      Home Living Family/patient expects to be discharged to:: Assisted living                                 Additional Comments: Pt comes from [facility]      Prior Functioning/Environment Prior Level of Function : Needs assist             Mobility Comments: Per patient she walks with a walker for short distances. She reports 2 recent falls. ADLs Comments: Pt reports that until "recently," she was able to do most ADL INDly. Since moving to [facility], she has required assistance with bathing and with IADL        OT Problem List: Decreased strength;Decreased range of motion;Decreased activity tolerance;Impaired balance (sitting and/or standing);Pain;Impaired UE functional use      OT Treatment/Interventions: Self-care/ADL training;Therapeutic exercise;Patient/family  education;Balance training;Therapeutic activities;Energy conservation;DME and/or AE instruction    OT Goals(Current goals can be found in the care plan section) Acute Rehab OT Goals Patient Stated Goal: to not have to move from facility to facility OT Goal Formulation: With patient Time For Goal Achievement: 02/09/22 Potential to Achieve Goals: Good ADL Goals Pt Will Perform Eating: with min assist;sitting Pt Will Perform Grooming: with min assist;sitting Pt Will Perform Upper Body Dressing: sitting;with mod assist  OT Frequency: Min 2X/week    Co-evaluation              AM-PAC OT "6 Clicks" Daily Activity     Outcome Measure Help from another person eating meals?: A Little Help from another person taking care of personal grooming?: A Lot Help from another person toileting, which includes using toliet, bedpan, or urinal?: A Lot Help from another person bathing (including washing, rinsing, drying)?: A Lot Help from another person to put on and taking off regular upper body clothing?: A Lot Help from another person to put on  and taking off regular lower body clothing?: A Lot 6 Click Score: 13   End of Session    Activity Tolerance: Patient limited by pain Patient left: in bed;with nursing/sitter in room;with bed alarm set;with call bell/phone within reach  OT Visit Diagnosis: Unsteadiness on feet (R26.81);Muscle weakness (generalized) (M62.81);Pain Pain - Right/Left: Left Pain - part of body: Arm                Time: 5188-4166 OT Time Calculation (min): 17 min Charges:  OT General Charges $OT Visit: 1 Visit OT Evaluation $OT Eval Low Complexity: 1 Low OT Treatments $Self Care/Home Management : 8-22 mins Latina Craver, PhD, MS, OTR/L 01/26/22, 9:45 AM

## 2022-01-26 NOTE — Progress Notes (Signed)
Physical Therapy Treatment Patient Details Name: Bianca Horton MRN: [id number] DOB: Aug 06, 1935 Today's Date: 01/26/2022   History of Present Illness Bianca Horton is an 85yoF who presents after mechanical fall at facility while using walker, workup revealing of Left humeral deformity. Pt was here in 2nd week October after a fall and subsequent fractures to Rt acetabulum, Rt pubic Ramus, Rt sacral Ala, most recently advanced to WBAT BLE. PMH: HTN, HLD, AAA, hypothyroidism, depression, CKD stage III, tobacco abuse. Imaging reports revealing of "impacted, angulated, comminuted fractures of the proximal left humerus, involving at least the left surgical neck. Glenohumeral and acromioclavicular joints appear to be in anatomic apposition." Ortho consult recommending conservative management in sling, NWB.    PT Comments    Pt was long sitting, asleep, in bed upon arriving. She easily awakes and agrees to session. Pt is alert but has cognition deficits come to light throughout session. Pt is very HOH. Was able to exit bed and stand with +1 HHA. Unfortunately session was greatly impacted by pt experiencing several Bms in standing. RN tech (x 2) present to assist with hygiene care. Pt is far from baseline abilities and requires increased time and assistance to perform all ADLs. Highly recommend Dc to SNF to maximize independence while assist pt to PLOF.   Recommendations for follow up therapy are one component of a multi-disciplinary discharge planning process, led by the attending physician.  Recommendations may be updated based on patient status, additional functional criteria and insurance authorization.  Follow Up Recommendations  Skilled nursing-short term rehab (<3 hours/day)     Assistance Recommended at Discharge Intermittent Supervision/Assistance  Patient can return home with the following A lot of help with walking and/or transfers;A lot of help with bathing/dressing/bathroom;Assistance with  cooking/housework;Assistance with feeding;Direct supervision/assist for medications management;Assist for transportation;Direct supervision/assist for financial management;Help with stairs or ramp for entrance   Equipment Recommendations  None recommended by PT       Precautions / Restrictions Precautions Precautions: Fall Restrictions Weight Bearing Restrictions: Yes LUE Weight Bearing: Non weight bearing RLE Weight Bearing: Weight bearing as tolerated LLE Weight Bearing: Weight bearing as tolerated     Mobility  Bed Mobility Overal bed mobility: Needs Assistance Bed Mobility: Supine to Sit, Sit to Supine  Supine to sit: Max assist  General bed mobility comments: Pt required max assist to exit.    Transfers Overall transfer level: Needs assistance Equipment used: 1 person hand held assist Transfers: Sit to/from Stand Sit to Stand: Mod assist, From elevated surface  General transfer comment: pt was able to stand from elevated bed height with HHA +1. Vcs for fwd wt shift.    Ambulation/Gait Ambulation/Gait assistance: Min assist Gait Distance (Feet): 3 Feet Assistive device: Rolling walker (2 wheels) Gait Pattern/deviations: Step-to pattern Gait velocity: decreased  General Gait Details: pt was able to take several steps from EOB to [facility]. pt has several Bms during 3 standing trials. She was left on BSC at conclusion of session with RN tech x 2 presents    Balance Overall balance assessment: Needs assistance Sitting-balance support: Single extremity supported, Feet supported Sitting balance-Leahy Scale: Fair     Standing balance support: Single extremity supported, During functional activity, Reliant on assistive device for balance Standing balance-Leahy Scale: Poor       Cognition Arousal/Alertness: Awake/alert Behavior During Therapy: WFL for tasks assessed/performed Overall Cognitive Status: Within Functional Limits for tasks assessed      General Comments: Pt   A and O x 2.  very pleasnat and cooperative but severely limited by BMs          PT Goals (current goals can now be found in the care plan section) Acute Rehab PT Goals Patient Stated Goal: never fall again Progress towards PT goals: Progressing toward goals    Frequency    7X/week      PT Plan Current plan remains appropriate       AM-PAC PT "6 Clicks" Mobility   Outcome Measure  Help needed turning from your back to your side while in a flat bed without using bedrails?: A Lot Help needed moving from lying on your back to sitting on the side of a flat bed without using bedrails?: A Lot Help needed moving to and from a bed to a chair (including a wheelchair)?: A Lot Help needed standing up from a chair using your arms (e.g., wheelchair or bedside chair)?: A Lot Help needed to walk in hospital room?: A Lot Help needed climbing 3-5 steps with a railing? : Total 6 Click Score: 11    End of Session   Activity Tolerance: Patient tolerated treatment well;Other (comment) (limited by frequent BMs) Patient left: Other (comment) (on [facility] with RN techs x 2 in room) Nurse Communication: Mobility status PT Visit Diagnosis: Unsteadiness on feet (R26.81);Other abnormalities of gait and mobility (R26.89);Difficulty in walking, not elsewhere classified (R26.2);Muscle weakness (generalized) (M62.81)     Time: 8828-0034 PT Time Calculation (min) (ACUTE ONLY): 15 min  Charges:  $Therapeutic Activity: 8-22 mins                    Jetta Lout PTA 01/26/22, 3:29 PM

## 2022-01-26 NOTE — Plan of Care (Signed)

## 2022-01-26 NOTE — TOC Progression Note (Addendum)
Transition of Care [facility]) - Progression Note    Patient Details  Name: Bianca Horton MRN: [id number] Date of Birth: 03/14/1935  [facility] CM/SW Contact  Bing Quarry, RN Phone Number: 01/26/2022, 2:31 PM  Clinical Narrative: 11/25: OT evaluation in. PT evaluation/session needed prior to starting insurance authorization per prior CM. PT aware and will see today. Gabriel Cirri RN CM       Expected Discharge Plan: Skilled Nursing Facility Barriers to Discharge: SNF Pending bed offer, Insurance Authorization  Expected Discharge Plan and Services Expected Discharge Plan: Skilled Nursing Facility       Living arrangements for the past 2 months: Assisted Living Facility                                       Social Determinants of Health (SDOH) Interventions    Readmission Risk Interventions     No data to display

## 2022-01-26 NOTE — Progress Notes (Signed)
Subjective:   Patient is sleeping but arousable.  Extensive bruising around the fracture site.  Hemoglobin is up to 10.5 today.  Neurovascular status is good.    Patient reports pain as moderate.  Objective:   VITALS:   Vitals:   01/26/22 0842 01/26/22 1008  BP: (!) 182/86 (!) 113/55  Pulse: (!) 101 98  Resp: 18   Temp: 98.2 F (36.8 C)   SpO2: 98%     Neurologically intact  LABS Recent Labs    01/24/22 0556 01/25/22 0426 01/25/22 1452 01/26/22 0923  HGB 7.3* 6.3* 7.9* 10.5*  HCT 22.8* 19.0* 23.7* 31.1*  WBC 6.8 8.8  --  9.7  PLT 92* 88*  --  92*    Recent Labs    01/24/22 0556 01/25/22 0426 01/26/22 0923  NA 133* 134* 136  K 3.4* 3.7 3.6  BUN 19 23 19   CREATININE 1.22* 1.36* 1.06*  GLUCOSE 102* 75 114*    No results for input(s): "LABPT", "INR" in the last 72 hours.   Assessment/Plan:      Discharge to SNF when available. Follow-up in my office 10 days to 2 weeks.

## 2022-01-27 LAB — BASIC METABOLIC PANEL
Anion gap: 4 — ABNORMAL LOW (ref 5–15)
BUN: 18 mg/dL (ref 8–23)
CO2: 25 mmol/L (ref 22–32)
Calcium: 9.6 mg/dL (ref 8.9–10.3)
Chloride: 105 mmol/L (ref 98–111)
Creatinine, Ser: 1.05 mg/dL — ABNORMAL HIGH (ref 0.44–1.00)
GFR, Estimated: 53 mL/min — ABNORMAL LOW (ref >60)
Glucose, Bld: 83 mg/dL (ref 70–99)
Potassium: 3.3 mmol/L — ABNORMAL LOW (ref 3.5–5.1)
Sodium: 134 mmol/L — ABNORMAL LOW (ref 135–145)

## 2022-01-27 LAB — CBC
HCT: 27.1 % — ABNORMAL LOW (ref 36.0–46.0)
Hemoglobin: 9.3 g/dL — ABNORMAL LOW (ref 12.0–15.0)
MCH: 32.1 pg (ref 26.0–34.0)
MCHC: 34.3 g/dL (ref 30.0–36.0)
MCV: 93.4 fL (ref 80.0–100.0)
Platelets: 99 10*3/uL — ABNORMAL LOW (ref 150–400)
RBC: 2.9 MIL/uL — ABNORMAL LOW (ref 3.87–5.11)
RDW: 14.6 % (ref 11.5–15.5)
WBC: 9.9 10*3/uL (ref 4.0–10.5)
nRBC: 0 % (ref 0.0–0.2)

## 2022-01-27 LAB — MAGNESIUM: Magnesium: 1.7 mg/dL (ref 1.7–2.4)

## 2022-01-27 MED ORDER — FOLIC ACID 1 MG PO TABS
1.0000 mg | ORAL_TABLET | Freq: Every day | ORAL | Status: DC
  Administered 2022-01-27 – 2022-01-28 (×2): 1 mg via ORAL
  Filled 2022-01-27 (×2): qty 1

## 2022-01-27 MED ORDER — ENALAPRIL MALEATE 2.5 MG PO TABS
2.5000 mg | ORAL_TABLET | Freq: Every day | ORAL | Status: DC
  Administered 2022-01-27 – 2022-01-28 (×2): 2.5 mg via ORAL
  Filled 2022-01-27 (×2): qty 1

## 2022-01-27 MED ORDER — CARVEDILOL 25 MG PO TABS
25.0000 mg | ORAL_TABLET | Freq: Two times a day (BID) | ORAL | Status: DC
  Administered 2022-01-27 – 2022-01-28 (×2): 25 mg via ORAL
  Filled 2022-01-27 (×2): qty 1

## 2022-01-27 MED ORDER — POTASSIUM CHLORIDE CRYS ER 20 MEQ PO TBCR
30.0000 meq | EXTENDED_RELEASE_TABLET | Freq: Two times a day (BID) | ORAL | Status: AC
  Administered 2022-01-27 (×2): 30 meq via ORAL
  Filled 2022-01-27 (×2): qty 1

## 2022-01-27 NOTE — TOC Progression Note (Signed)
Transition of Care [facility]) - Progression Note    Patient Details  Name: Bianca Horton MRN: [id number] Date of Birth: Jul 08, 1935  Transition of Care [facility]) CM/SW Contact  Bing Quarry, RN Phone Number: 01/27/2022, 9:24 AM  Clinical Narrative:  11/25: PT evaluation and recommendation for STR/SNF in and insurance authorization started via [place]. NH ID #[id number]. Gabriel Cirri RN CM     Expected Discharge Plan: Skilled Nursing Facility Barriers to Discharge: SNF Pending bed offer, Insurance Authorization  Expected Discharge Plan and Services Expected Discharge Plan: Skilled Nursing Facility       Living arrangements for the past 2 months: Assisted Living Facility                                       Social Determinants of Health (SDOH) Interventions    Readmission Risk Interventions     No data to display

## 2022-01-27 NOTE — Progress Notes (Signed)
Subjective:    Patient is stable.  Remains in the sling and swath.  Neurovascular status good distally.  Quite a bit of ecchymosis.  Hemoglobin improved.  Patient reports pain as mild.  Objective:   VITALS:   Vitals:   01/27/22 0850 01/27/22 1018  BP: (!) 172/89 (!) 168/88  Pulse: (!) 102 84  Resp: 17   Temp: 98 F (36.7 C)   SpO2: 96%     Neurologically intact  LABS Recent Labs    01/25/22 0426 01/25/22 1452 01/26/22 0923 01/27/22 0448  HGB 6.3* 7.9* 10.5* 9.3*  HCT 19.0* 23.7* 31.1* 27.1*  WBC 8.8  --  9.7 9.9  PLT 88*  --  92* 99*    Recent Labs    01/25/22 0426 01/26/22 0923 01/27/22 0448  NA 134* 136 134*  K 3.7 3.6 3.3*  BUN 23 19 18   CREATININE 1.36* 1.06* 1.05*  GLUCOSE 75 114* 83    No results for input(s): "LABPT", "INR" in the last 72 hours.   Assessment/Plan:      Up with therapy Skilled nursing facility when available. Follow-up in my office in 10 days to 2 weeks.

## 2022-01-27 NOTE — Progress Notes (Signed)
PROGRESS NOTE    Bianca Horton  JME:[id number] DOB: 1935-11-11 DOA: 01/23/2022 PCP: Margaretann Loveless, MD   Brief Narrative:  86 year old female with past medical history of hypertension, hyperlipidemia, hypothyroidism, stage IIIb CKD, chronic anemia/thrombocytopenia, abdominal aortic aneurysm who presented with complaint of left shoulder pain after a fall at ALF while walking with a walker.  X-ray showed displaced proximal left humeral fracture.  She just had right pelvic fracture on month ago.  Orthopedics consulted.  Recommended conservative management with sling, outpatient follow-up with orthopedics    Assessment & Plan:   Expand All Collapse All   PROGRESS NOTE Bianca Horton           [id number]:[id number] DOB: Jan 03, 1936 DOA: 01/23/2022 PCP: Margaretann Loveless, MD       Brief Narrative/Hospital Course: 86 year old female with past medical history of hypertension, hyperlipidemia, hypothyroidism, stage IIIb CKD, chronic anemia/thrombocytopenia, abdominal aortic aneurysm who presented with complaint of left shoulder pain after a fall at ALF while walking with a walker.  X-ray showed displaced proximal left humeral fracture.  She just had right pelvic fracture on month ago.  Orthopedics consulted.  Recommended conservative management with sling, outpatient follow-up with orthopedics    Assessment and Plan:   Left humeral fracture:  -Secondary to fall after her walker got caught up.  Seen by Dr. Hyacinth Meeker from orthopedics advised nonoperative management.  Continue sling.   -Continue as needed pain control -In the ED underwent extensive x-ray, CT head C-spine without other injuries.  -Evaluated by OT recommended SNF.   -Hemoglobin remained stable.  Neurovascular status improved. Discharged to SNF when available.  Follow-up with orthopedic in 10 days to 2 weeks outpatient.    Hypertension: Blood pressure is elevated.  Will resume home Coreg and enalapril.  Continue to monitor blood pressure  closely.  CKD stage IIIb: Remains stable at baseline   Hypokalemia: Replenished.  Magnesium: WNL.  Repeat BMP tomorrow a.m.   Chronic normocytic anemia/thrombocytopenia: Has chronic normocytic anemia with baseline hemoglobin ranges from 7-8.   -Hemoglobin dropped to 6.3.  Status post 1 unit PRBC transfusion on 01/25/2022.  Hemoglobin slightly trended down from 10.5-9.3.  We will continue to monitor. Platelet count remained at baseline.  Hypothyroidism:cont Synthyroid  Hypokalemia: Resolved.    Mild hyponatremia: Sodium 134.  Will continue to monitor  Frequent falls/deconditioning: Currently in ALF, continue PT OT recommending skilled nursing facility.  TOC consulted.  Diarrhea: Tested negative for C. difficile  HLD: Continue statins  Goals of care currently DNR.   DVT prophylaxis: heparin injection 5,000 Units Start: 01/23/22 2200 Code Status:   Code Status: DNR Family Communication: plan of care discussed with patient   Dispo: The patient is from: ALF          Anticipated disposition: snf  when bed available.    DVT prophylaxis: heparin Code Status: DNR Family Communication:  None present at bedside.  Plan of care discussed with patient in length and she verbalized understanding and agreed with it. Disposition Plan: SNF  Consultants:  Ortho surgery  Procedures:  None  Antimicrobials:  None  Status is: Inpatient     Subjective: Patient seen and examined.  Resting comfortably on the bed.  Hard of hearing.  Denies any complaints.  Remained afebrile.  No acute events overnight.  Objective: Vitals:   01/26/22 1630 01/27/22 0005 01/27/22 0850 01/27/22 1018  BP: (!) 169/77 (!) 143/74 (!) 172/89 (!) 168/88  Pulse: 98 98 (!) 102 84  Resp: 18 18  17   Temp: 98.3 F (36.8 C) 98.2 F (36.8 C) 98 F (36.7 C)   TempSrc: Oral Oral    SpO2: 100% 100% 96%   Weight:      Height:        Intake/Output Summary (Last 24 hours) at 01/27/2022 1145 Last data filed at  01/27/2022 0615 Gross per 24 hour  Intake 240 ml  Output 1150 ml  Net -910 ml    Filed Weights   01/23/22 1827  Weight: 52.2 kg    Examination:  General exam: Appears calm and comfortable, elderly white female, lying comfortably on the bed.  On room air, hard of hearing Respiratory system: Clear to auscultation. Respiratory effort normal. Cardiovascular system: S1 & S2 heard, RRR. No JVD, murmurs, rubs, gallops or clicks. No pedal edema. Gastrointestinal system: Abdomen is nondistended, soft and nontender. No organomegaly or masses felt. Normal bowel sounds heard. Central nervous system: Alert and oriented. No focal neurological deficits. Extremities: Bruising on left lower and both lower extremities and left arm in sling.  Able to move/wiggle left hand without any issues. Psychiatry: Judgement and insight appear normal. Mood & affect appropriate.    Data Reviewed: I have personally reviewed following labs and imaging studies  CBC: Recent Labs  Lab 01/23/22 1857 01/24/22 0556 01/25/22 0426 01/25/22 1452 01/26/22 0923 01/27/22 0448  WBC 7.8 6.8 8.8  --  9.7 9.9  NEUTROABS 5.2  --   --   --   --   --   HGB 8.3* 7.3* 6.3* 7.9* 10.5* 9.3*  HCT 25.9* 22.8* 19.0* 23.7* 31.1* 27.1*  MCV 100.0 100.0 97.4  --  94.0 93.4  PLT 108* 92* 88*  --  92* 99*    Basic Metabolic Panel: Recent Labs  Lab 01/23/22 1857 01/24/22 0556 01/25/22 0426 01/26/22 0923 01/27/22 0448  NA 134* 133* 134* 136 134*  K 4.0 3.4* 3.7 3.6 3.3*  CL 105 106 107 107 105  CO2 25 22 22  20* 25  GLUCOSE 111* 102* 75 114* 83  BUN 18 19 23 19 18   CREATININE 1.33* 1.22* 1.36* 1.06* 1.05*  CALCIUM 9.6 9.2 9.0 9.5 9.6  MG  --   --   --   --  1.7    GFR: Estimated Creatinine Clearance: 33.5 mL/min (A) (by C-G formula based on SCr of 1.05 mg/dL (H)). Liver Function Tests: No results for input(s): "AST", "ALT", "ALKPHOS", "BILITOT", "PROT", "ALBUMIN" in the last 168 hours. No results for input(s):  "LIPASE", "AMYLASE" in the last 168 hours. No results for input(s): "AMMONIA" in the last 168 hours. Coagulation Profile: No results for input(s): "INR", "PROTIME" in the last 168 hours. Cardiac Enzymes: No results for input(s): "CKTOTAL", "CKMB", "CKMBINDEX", "TROPONINI" in the last 168 hours. BNP (last 3 results) No results for input(s): "PROBNP" in the last 8760 hours. HbA1C: No results for input(s): "HGBA1C" in the last 72 hours. CBG: No results for input(s): "GLUCAP" in the last 168 hours. Lipid Profile: No results for input(s): "CHOL", "HDL", "LDLCALC", "TRIG", "CHOLHDL", "LDLDIRECT" in the last 72 hours. Thyroid Function Tests: No results for input(s): "TSH", "T4TOTAL", "FREET4", "T3FREE", "THYROIDAB" in the last 72 hours. Anemia Panel: Recent Labs    01/25/22 0426 01/25/22 0756  VITAMINB12  --  104*  FOLATE 15.0  --   FERRITIN 111  --   TIBC 186*  --   IRON 26*  --   RETICCTPCT 2.4  --     Sepsis Labs: No results for input(s): "  PROCALCITON", "LATICACIDVEN" in the last 168 hours.  Recent Results (from the past 240 hour(s))  C Difficile Quick Screen w PCR reflex     Status: None   Collection Time: 01/25/22  9:32 PM   Specimen: STOOL  Result Value Ref Range Status   C Diff antigen NEGATIVE NEGATIVE Final   C Diff toxin NEGATIVE NEGATIVE Final   C Diff interpretation No C. difficile detected.  Final    Comment: Performed at [facility], [address], [place], [place] [address]      Radiology Studies: No results found.  Scheduled Meds:  sodium chloride   Intravenous Once   aspirin  325 mg Oral Daily   buPROPion  150 mg Oral Daily   cilostazol  100 mg Oral BID   escitalopram  10 mg Oral Daily   heparin  5,000 Units Subcutaneous Q8H   levothyroxine  125 mcg Oral Q0600   potassium chloride  30 mEq Oral BID   simvastatin  40 mg Oral q1800   Continuous Infusions:     LOS: 2 days   Time spent: 35 minutes   Jovi Zavadil Estill Cotta, MD [facility]  If 7PM-7AM, please contact night-coverage [email or web address] 01/27/2022, 11:45 AM

## 2022-01-27 NOTE — Progress Notes (Signed)
Physical Therapy Treatment Patient Details Name: Bianca Horton MRN: [id number] DOB: 27-Oct-1935 Today's Date: 01/27/2022   History of Present Illness Bianca Horton is an 32yoF who presents after mechanical fall at facility while using walker, workup revealing of Left humeral deformity. Pt was here in 2nd week October after a fall and subsequent fractures to Rt acetabulum, Rt pubic Ramus, Rt sacral Ala, most recently advanced to WBAT BLE. PMH: HTN, HLD, AAA, hypothyroidism, depression, CKD stage III, tobacco abuse. Imaging reports revealing of "impacted, angulated, comminuted fractures of the proximal left humerus, involving at least the left surgical neck. Glenohumeral and acromioclavicular joints appear to be in anatomic apposition." Ortho consult recommending conservative management in sling, NWB.    PT Comments    Pt in bed.  Initially declined OOB but agreed with encouragement.  She is able to get to EOB with min a x 1 and good effort.  Generally steady in sitting.  Stood x 1 but unable to march or move feet to progress to chair.  Seated rest and chair is repositioned along with fee to facilitate turning.  Stood with min a x 1 and is able to turn with assist of Probation officer.  Remained in chair with needs met.  Attempted seated ex but pt fatigued with tasks.   Recommendations for follow up therapy are one component of a multi-disciplinary discharge planning process, led by the attending physician.  Recommendations may be updated based on patient status, additional functional criteria and insurance authorization.  Follow Up Recommendations  Skilled nursing-short term rehab (<3 hours/day)     Assistance Recommended at Discharge Intermittent Supervision/Assistance  Patient can return home with the following A lot of help with walking and/or transfers;A lot of help with bathing/dressing/bathroom;Assistance with cooking/housework;Assistance with feeding;Direct supervision/assist for medications  management;Assist for transportation;Direct supervision/assist for financial management;Help with stairs or ramp for entrance   Equipment Recommendations  None recommended by PT    Recommendations for Other Services       Precautions / Restrictions Precautions Precautions: Fall Restrictions Weight Bearing Restrictions: Yes LUE Weight Bearing: Non weight bearing RLE Weight Bearing: Weight bearing as tolerated LLE Weight Bearing: Weight bearing as tolerated     Mobility  Bed Mobility Overal bed mobility: Needs Assistance Bed Mobility: Supine to Sit     Supine to sit: Min assist          Transfers Overall transfer level: Needs assistance Equipment used: 1 person hand held assist Transfers: Sit to/from Stand, Bed to chair/wheelchair/BSC Sit to Stand: Min assist Stand pivot transfers: Mod assist, Min assist         General transfer comment: unable to turn feet or step on her own.  feet positioned to facilitate turn and assisted by Probation officer.    Ambulation/Gait               General Gait Details: unable to step today   Stairs             Wheelchair Mobility    Modified Rankin (Stroke Patients Only)       Balance Overall balance assessment: Needs assistance Sitting-balance support: Single extremity supported, Feet supported Sitting balance-Leahy Scale: Fair     Standing balance support: Single extremity supported, During functional activity Standing balance-Leahy Scale: Poor                              Cognition Arousal/Alertness: Awake/alert Behavior During Therapy: WFL for tasks assessed/performed Overall  Cognitive Status: Within Functional Limits for tasks assessed                                          Exercises      General Comments        Pertinent Vitals/Pain Pain Assessment Pain Assessment: Faces Faces Pain Scale: Hurts even more Pain Location: L arm, w/ movement Pain Descriptors /  Indicators: Guarding, Grimacing Pain Intervention(s): Limited activity within patient's tolerance, Monitored during session, Repositioned    Home Living                          Prior Function            PT Goals (current goals can now be found in the care plan section) Progress towards PT goals: Progressing toward goals    Frequency    7X/week      PT Plan Current plan remains appropriate    Co-evaluation              AM-PAC PT "6 Clicks" Mobility   Outcome Measure  Help needed turning from your back to your side while in a flat bed without using bedrails?: A Lot Help needed moving from lying on your back to sitting on the side of a flat bed without using bedrails?: A Little Help needed moving to and from a bed to a chair (including a wheelchair)?: A Lot Help needed standing up from a chair using your arms (e.g., wheelchair or bedside chair)?: A Little Help needed to walk in hospital room?: A Lot Help needed climbing 3-5 steps with a railing? : Total 6 Click Score: 13    End of Session   Activity Tolerance: Patient tolerated treatment well;Other (comment) (limited by frequent BMs) Patient left: Other (comment) (on [facility] with RN techs x 2 in room) Nurse Communication: Mobility status PT Visit Diagnosis: Unsteadiness on feet (R26.81);Other abnormalities of gait and mobility (R26.89);Difficulty in walking, not elsewhere classified (R26.2);Muscle weakness (generalized) (M62.81)     Time: 6147-0929 PT Time Calculation (min) (ACUTE ONLY): 14 min  Charges:  $Therapeutic Activity: 8-22 mins                   Chesley Noon, PTA 01/27/22, 12:48 PM

## 2022-01-28 DIAGNOSIS — E7800 Pure hypercholesterolemia, unspecified: Secondary | ICD-10-CM | POA: Diagnosis not present

## 2022-01-28 DIAGNOSIS — I129 Hypertensive chronic kidney disease with stage 1 through stage 4 chronic kidney disease, or unspecified chronic kidney disease: Secondary | ICD-10-CM | POA: Diagnosis not present

## 2022-01-28 DIAGNOSIS — S32402A Unspecified fracture of left acetabulum, initial encounter for closed fracture: Secondary | ICD-10-CM | POA: Diagnosis not present

## 2022-01-28 DIAGNOSIS — K5909 Other constipation: Secondary | ICD-10-CM | POA: Diagnosis not present

## 2022-01-28 DIAGNOSIS — D500 Iron deficiency anemia secondary to blood loss (chronic): Secondary | ICD-10-CM | POA: Diagnosis not present

## 2022-01-28 DIAGNOSIS — E039 Hypothyroidism, unspecified: Secondary | ICD-10-CM | POA: Diagnosis not present

## 2022-01-28 DIAGNOSIS — W19XXXA Unspecified fall, initial encounter: Secondary | ICD-10-CM | POA: Diagnosis not present

## 2022-01-28 DIAGNOSIS — M6281 Muscle weakness (generalized): Secondary | ICD-10-CM | POA: Diagnosis not present

## 2022-01-28 DIAGNOSIS — I10 Essential (primary) hypertension: Secondary | ICD-10-CM | POA: Diagnosis not present

## 2022-01-28 DIAGNOSIS — W19XXXD Unspecified fall, subsequent encounter: Secondary | ICD-10-CM | POA: Diagnosis not present

## 2022-01-28 DIAGNOSIS — W010XXD Fall on same level from slipping, tripping and stumbling without subsequent striking against object, subsequent encounter: Secondary | ICD-10-CM | POA: Diagnosis not present

## 2022-01-28 DIAGNOSIS — D649 Anemia, unspecified: Secondary | ICD-10-CM | POA: Diagnosis not present

## 2022-01-28 DIAGNOSIS — Z7401 Bed confinement status: Secondary | ICD-10-CM | POA: Diagnosis not present

## 2022-01-28 DIAGNOSIS — S3289XA Fracture of other parts of pelvis, initial encounter for closed fracture: Secondary | ICD-10-CM | POA: Diagnosis not present

## 2022-01-28 DIAGNOSIS — S32810D Multiple fractures of pelvis with stable disruption of pelvic ring, subsequent encounter for fracture with routine healing: Secondary | ICD-10-CM | POA: Diagnosis not present

## 2022-01-28 DIAGNOSIS — D631 Anemia in chronic kidney disease: Secondary | ICD-10-CM | POA: Diagnosis not present

## 2022-01-28 DIAGNOSIS — E782 Mixed hyperlipidemia: Secondary | ICD-10-CM | POA: Diagnosis not present

## 2022-01-28 DIAGNOSIS — N183 Chronic kidney disease, stage 3 unspecified: Secondary | ICD-10-CM | POA: Diagnosis not present

## 2022-01-28 DIAGNOSIS — Z720 Tobacco use: Secondary | ICD-10-CM | POA: Diagnosis not present

## 2022-01-28 DIAGNOSIS — Z7982 Long term (current) use of aspirin: Secondary | ICD-10-CM | POA: Diagnosis not present

## 2022-01-28 DIAGNOSIS — S42202A Unspecified fracture of upper end of left humerus, initial encounter for closed fracture: Secondary | ICD-10-CM | POA: Diagnosis not present

## 2022-01-28 DIAGNOSIS — S42202D Unspecified fracture of upper end of left humerus, subsequent encounter for fracture with routine healing: Secondary | ICD-10-CM | POA: Diagnosis not present

## 2022-01-28 DIAGNOSIS — N1832 Chronic kidney disease, stage 3b: Secondary | ICD-10-CM | POA: Diagnosis not present

## 2022-01-28 DIAGNOSIS — S32592A Other specified fracture of left pubis, initial encounter for closed fracture: Secondary | ICD-10-CM | POA: Diagnosis not present

## 2022-01-28 DIAGNOSIS — K5900 Constipation, unspecified: Secondary | ICD-10-CM | POA: Diagnosis not present

## 2022-01-28 DIAGNOSIS — I714 Abdominal aortic aneurysm, without rupture, unspecified: Secondary | ICD-10-CM | POA: Diagnosis not present

## 2022-01-28 DIAGNOSIS — Z743 Need for continuous supervision: Secondary | ICD-10-CM | POA: Diagnosis not present

## 2022-01-28 DIAGNOSIS — E038 Other specified hypothyroidism: Secondary | ICD-10-CM | POA: Diagnosis not present

## 2022-01-28 LAB — CBC
HCT: 22.8 % — ABNORMAL LOW (ref 36.0–46.0)
Hemoglobin: 7.8 g/dL — ABNORMAL LOW (ref 12.0–15.0)
MCH: 32.2 pg (ref 26.0–34.0)
MCHC: 34.2 g/dL (ref 30.0–36.0)
MCV: 94.2 fL (ref 80.0–100.0)
Platelets: 99 10*3/uL — ABNORMAL LOW (ref 150–400)
RBC: 2.42 MIL/uL — ABNORMAL LOW (ref 3.87–5.11)
RDW: 14.5 % (ref 11.5–15.5)
WBC: 8.2 10*3/uL (ref 4.0–10.5)
nRBC: 0 % (ref 0.0–0.2)

## 2022-01-28 LAB — BASIC METABOLIC PANEL
Anion gap: 4 — ABNORMAL LOW (ref 5–15)
BUN: 26 mg/dL — ABNORMAL HIGH (ref 8–23)
CO2: 22 mmol/L (ref 22–32)
Calcium: 9.4 mg/dL (ref 8.9–10.3)
Chloride: 108 mmol/L (ref 98–111)
Creatinine, Ser: 1.24 mg/dL — ABNORMAL HIGH (ref 0.44–1.00)
GFR, Estimated: 43 mL/min — ABNORMAL LOW (ref >60)
Glucose, Bld: 72 mg/dL (ref 70–99)
Potassium: 4.3 mmol/L (ref 3.5–5.1)
Sodium: 134 mmol/L — ABNORMAL LOW (ref 135–145)

## 2022-01-28 LAB — MAGNESIUM: Magnesium: 1.7 mg/dL (ref 1.7–2.4)

## 2022-01-28 MED ORDER — POLYETHYLENE GLYCOL 3350 17 G PO PACK: 17.0000 g | PACK | Freq: Every day | ORAL | 0 refills | Status: AC

## 2022-01-28 MED ORDER — SENNOSIDES-DOCUSATE SODIUM 8.6-50 MG PO TABS: 1.0000 | ORAL_TABLET | Freq: Every evening | ORAL | Status: DC | PRN

## 2022-01-28 MED ORDER — OXYCODONE-ACETAMINOPHEN 5-325 MG PO TABS: 1.0000 | ORAL_TABLET | Freq: Four times a day (QID) | ORAL | 0 refills | Status: DC | PRN

## 2022-01-28 NOTE — Discharge Summary (Signed)
Physician Discharge Summary  Bianca Horton [id number] DOB: 06/28/1935 DOA: 01/23/2022  PCP: Perrin Maltese, MD  Admit date: 01/23/2022 Discharge date: 01/28/2022  Admitted From: Home Disposition:  Home  Discharge Condition:Stable CODE STATUS: DNR Diet recommendation: Heart Healthy  Brief/Interim Summary: Patient is a 86 year old female with past medical history of hypertension, hyperlipidemia, hypothyroidism, stage IIIb CKD, chronic anemia/thrombocytopenia, abdominal aortic aneurysm who presented with complaint of left shoulder pain after a fall at ALF while walking with a walker.  X-ray showed displaced proximal left humeral fracture.  She just had right pelvic fracture on month ago.  Orthopedics consulted.  Recommended conservative management with sling, outpatient follow-up with orthopedics.  PT/OT recommending SNF on discharge.  Medically stable for discharge today.  Following  problems were addressed during her hospitalization:  Left humeral fracture: She was walking with a walker, her walker got caught up and she fell.   X-ray showed impacted angulated commuted fracture of proximal left humerus. No head injury.  CT head, cervical spine without any acute findings. Orthopedics consulted and was following.Recommended conservative management with sling, outpatient follow-up with orthopedics.  PT/OT recommendation is SNF. She is to follow-up with orthopedics in a week.   Hypertension: Blood pressure stable.  Continue home medications: coreg,vasotec    CKD stage IIIb: Currently kidney function at baseline.   Chronic normocytic anemia/thrombocytopenia: Has chronic normocytic anemia with baseline hemoglobin ranges from 7-8.  Has chronic thrombocytopenia.  Continue to monitor.  She was transfused with a unit of PRBC on 01/25/2022.   Hypothyroidism: Continue Synthyroid  Hyperlipidemia: Continue statin   Frequent falls/deconditioning: She is staying at ALF .Ambulates with walker.   She has a history of frequent falls.  Needs PT/OT evaluation and possible care at SNF    Discharge Diagnoses:  Principal Problem:   Closed fracture of proximal end of left humerus, unspecified fracture morphology, initial encounter Active Problems:   Essential hypertension   Stage 3b chronic kidney disease (CKD) (HCC)   Hypothyroidism   Thrombocytopenia (HCC)   Closed fracture of left proximal humerus    Discharge Instructions  Discharge Instructions     Diet - low sodium heart healthy   Complete by: As directed    Discharge instructions   Complete by: As directed    1)Please follow up with orthopedics in 1 week.  Name and number the provider has been attached 2)Take your medications as instructed 3)Do a CBC and BMP test in a week   Increase activity slowly   Complete by: As directed       Allergies as of 01/28/2022       Reactions   Clopidogrel    Other reaction(s): brusing skin   Sertraline Hcl Nausea And Vomiting   Other reaction(s): Nausea, Vomiting, Diarrhea, sleepwalking, Nausea, Vomiting, Diarrhea, sleepwalking        Medication List     STOP taking these medications    PreserVision/Lutein Caps       TAKE these medications    acetaminophen 500 MG tablet Commonly known as: TYLENOL Take 1,000 mg by mouth 3 (three) times daily as needed for mild pain.   aspirin 81 MG chewable tablet Chew 81 mg by mouth daily.   buPROPion 150 MG 24 hr tablet Commonly known as: WELLBUTRIN XL Take 150 mg by mouth daily.   calcium carbonate 500 MG chewable tablet Commonly known as: TUMS - dosed in mg elemental calcium Chew 1 tablet by mouth 2 (two) times daily as needed for indigestion or heartburn.  carvedilol 25 MG tablet Commonly known as: COREG Take 25 mg by mouth 2 (two) times daily with a meal.   chlorhexidine 0.12 % solution Commonly known as: PERIDEX Use as directed 15 mLs in the mouth or throat 2 (two) times daily.   cilostazol 100 MG  tablet Commonly known as: PLETAL Take 100 mg by mouth 2 (two) times daily.   enalapril 2.5 MG tablet Commonly known as: VASOTEC Take 2.5 mg by mouth daily.   escitalopram 10 MG tablet Commonly known as: LEXAPRO Take 10 mg by mouth daily.   folic acid 1 MG tablet Commonly known as: FOLVITE Take 1 mg by mouth daily.   ICY HOT ADVANCED RELIEF EX Apply 1 Application topically as needed.   levothyroxine 125 MCG tablet Commonly known as: SYNTHROID Take 125 mcg by mouth every morning.   oxyCODONE-acetaminophen 5-325 MG tablet Commonly known as: PERCOCET/ROXICET Take 1 tablet by mouth 4 (four) times daily as needed for moderate pain.   polyethylene glycol 17 g packet Commonly known as: MiraLax Take 17 g by mouth daily.   risperiDONE 0.25 MG tablet Commonly known as: RISPERDAL Take 0.25 mg by mouth 2 (two) times daily as needed.   senna-docusate 8.6-50 MG tablet Commonly known as: Senokot-S Take 1 tablet by mouth at bedtime as needed for mild constipation.   simvastatin 40 MG tablet Commonly known as: ZOCOR Take 40 mg by mouth at bedtime.        Contact information for follow-up providers     Earnestine Leys, MD. Schedule an appointment as soon as possible for a visit in 2 week(s).   Specialty: Orthopedic Surgery Contact information: [place] [address] [phone]              Contact information for after-discharge care     [facility] Preferred SNF .   Service: Skilled Nursing Contact information: [place] [phone]                    Allergies  Allergen Reactions   Clopidogrel     Other reaction(s): brusing skin   Sertraline Hcl Nausea And Vomiting    Other reaction(s): Nausea, Vomiting, Diarrhea, sleepwalking, Nausea, Vomiting, Diarrhea, sleepwalking     Consultations: Orthopedics   Procedures/Studies: CT Head Wo Contrast  Result Date: 01/23/2022 CLINICAL DATA:  Fall head injury EXAM: CT HEAD WITHOUT CONTRAST CT CERVICAL SPINE WITHOUT CONTRAST TECHNIQUE: Multidetector CT imaging of the head and cervical spine was performed following the standard protocol without intravenous contrast. Multiplanar CT image reconstructions of the cervical spine were also generated. RADIATION DOSE REDUCTION: This exam was performed according to the departmental dose-optimization program which includes automated exposure control, adjustment of the mA and/or kV according to patient size and/or use of iterative reconstruction technique. COMPARISON:  12/06/2021 FINDINGS: CT HEAD FINDINGS Brain: No evidence of acute infarction, hemorrhage, hydrocephalus, extra-axial collection or mass lesion/mass effect. Periventricular and deep white matter hypodensity. Mild global cerebral volume loss. Vascular: No hyperdense vessel or unexpected calcification. Skull: Normal. Negative for fracture or focal lesion. Sinuses/Orbits: No acute finding. Other: None. CT CERVICAL SPINE FINDINGS Alignment: Degenerative straightening of the normal cervical lordosis. Skull base and vertebrae: No acute fracture. No primary bone lesion or focal pathologic process. Soft tissues and spinal canal: No prevertebral fluid or swelling. No visible canal hematoma. Disc levels: Moderate to severe multilevel disc  space height loss and osteophytosis throughout the cervical spine, worst at C6-C7. Upper chest: Negative. Other: None. IMPRESSION: 1. No acute intracranial pathology. Small-vessel white matter disease and mild global cerebral volume loss. 2. No fracture or static subluxation of the cervical spine. 3. Moderate to severe multilevel disc degenerative disease throughout the cervical spine, worst at C6-C7. Electronically Signed   By: Delanna Ahmadi M.D.   On: 01/23/2022 18:48   CT Cervical Spine Wo  Contrast  Result Date: 01/23/2022 CLINICAL DATA:  Fall head injury EXAM: CT HEAD WITHOUT CONTRAST CT CERVICAL SPINE WITHOUT CONTRAST TECHNIQUE: Multidetector CT imaging of the head and cervical spine was performed following the standard protocol without intravenous contrast. Multiplanar CT image reconstructions of the cervical spine were also generated. RADIATION DOSE REDUCTION: This exam was performed according to the departmental dose-optimization program which includes automated exposure control, adjustment of the mA and/or kV according to patient size and/or use of iterative reconstruction technique. COMPARISON:  12/06/2021 FINDINGS: CT HEAD FINDINGS Brain: No evidence of acute infarction, hemorrhage, hydrocephalus, extra-axial collection or mass lesion/mass effect. Periventricular and deep white matter hypodensity. Mild global cerebral volume loss. Vascular: No hyperdense vessel or unexpected calcification. Skull: Normal. Negative for fracture or focal lesion. Sinuses/Orbits: No acute finding. Other: None. CT CERVICAL SPINE FINDINGS Alignment: Degenerative straightening of the normal cervical lordosis. Skull base and vertebrae: No acute fracture. No primary bone lesion or focal pathologic process. Soft tissues and spinal canal: No prevertebral fluid or swelling. No visible canal hematoma. Disc levels: Moderate to severe multilevel disc space height loss and osteophytosis throughout the cervical spine, worst at C6-C7. Upper chest: Negative. Other: None. IMPRESSION: 1. No acute intracranial pathology. Small-vessel white matter disease and mild global cerebral volume loss. 2. No fracture or static subluxation of the cervical spine. 3. Moderate to severe multilevel disc degenerative disease throughout the cervical spine, worst at C6-C7. Electronically Signed   By: Delanna Ahmadi M.D.   On: 01/23/2022 18:48   DG Shoulder Left  Result Date: 01/23/2022 CLINICAL DATA:  Fall, deformity EXAM: LEFT SHOULDER - 2+  VIEW COMPARISON:  None Available. FINDINGS: Impacted, angulated, comminuted fractures of the proximal left humerus, involving at least the left surgical neck. Glenohumeral and acromioclavicular joints appear to be in anatomic apposition. Partially imaged left chest is unremarkable. IMPRESSION: Impacted, angulated, comminuted fractures of the proximal left humerus, involving at least the left surgical neck. Glenohumeral and acromioclavicular joints appear to be in anatomic apposition. Consider CT to further evaluate proximal humeral fracture anatomy. Electronically Signed   By: Delanna Ahmadi M.D.   On: 01/23/2022 18:41      Subjective: Patient seen and examined at bedside today.  Hemodynamically stable for discharge.  No new complaints.  Discharge Exam: Vitals:   01/28/22 0027 01/28/22 0700  BP: 117/62 125/67  Pulse: 88 82  Resp: 18 12  Temp: 97.6 F (36.4 C) 97.8 F (36.6 C)  SpO2: 96% 97%   Vitals:   01/27/22 1624 01/27/22 1722 01/28/22 0027 01/28/22 0700  BP: (!) 121/107 125/68 117/62 125/67  Pulse: (!) 105 98 88 82  Resp: 17  18 12   Temp: 97.9 F (36.6 C)  97.6 F (36.4 C) 97.8 F (36.6 C)  TempSrc:    Oral  SpO2: 97%  96% 97%  Weight:      Height:        General: Pt is alert, awake, not in acute distress Cardiovascular: RRR, S1/S2 +, no rubs, no gallops Respiratory: CTA bilaterally, no wheezing,  no rhonchi Abdominal: Soft, NT, ND, bowel sounds + Extremities: no edema, no cyanosis, sling on the left arm    The results of significant diagnostics from this hospitalization (including imaging, microbiology, ancillary and laboratory) are listed below for reference.     Microbiology: Recent Results (from the past 240 hour(s))  C Difficile Quick Screen w PCR reflex     Status: None   Collection Time: 01/25/22  9:32 PM   Specimen: STOOL  Result Value Ref Range Status   C Diff antigen NEGATIVE NEGATIVE Final   C Diff toxin NEGATIVE NEGATIVE Final   C Diff interpretation  No C. difficile detected.  Final    Comment: Performed at [facility], [address] [place], [place] [address]     Labs: BNP (last 3 results) Recent Labs    12/06/21 0543  BNP 0000000*   Basic Metabolic Panel: Recent Labs  Lab 01/24/22 0556 01/25/22 0426 01/26/22 0923 01/27/22 0448 01/28/22 0545  NA 133* 134* 136 134* 134*  K 3.4* 3.7 3.6 3.3* 4.3  CL 106 107 107 105 108  CO2 22 22 20* 25 22  GLUCOSE 102* 75 114* 83 72  BUN 19 23 19 18  26*  CREATININE 1.22* 1.36* 1.06* 1.05* 1.24*  CALCIUM 9.2 9.0 9.5 9.6 9.4  MG  --   --   --  1.7 1.7   Liver Function Tests: No results for input(s): "AST", "ALT", "ALKPHOS", "BILITOT", "PROT", "ALBUMIN" in the last 168 hours. No results for input(s): "LIPASE", "AMYLASE" in the last 168 hours. No results for input(s): "AMMONIA" in the last 168 hours. CBC: Recent Labs  Lab 01/23/22 1857 01/24/22 0556 01/25/22 0426 01/25/22 1452 01/26/22 0923 01/27/22 0448 01/28/22 0545  WBC 7.8 6.8 8.8  --  9.7 9.9 8.2  NEUTROABS 5.2  --   --   --   --   --   --   HGB 8.3* 7.3* 6.3* 7.9* 10.5* 9.3* 7.8*  HCT 25.9* 22.8* 19.0* 23.7* 31.1* 27.1* 22.8*  MCV 100.0 100.0 97.4  --  94.0 93.4 94.2  PLT 108* 92* 88*  --  92* 99* 99*   Cardiac Enzymes: No results for input(s): "CKTOTAL", "CKMB", "CKMBINDEX", "TROPONINI" in the last 168 hours. BNP: Invalid input(s): "POCBNP" CBG: No results for input(s): "GLUCAP" in the last 168 hours. D-Dimer No results for input(s): "DDIMER" in the last 72 hours. Hgb A1c No results for input(s): "HGBA1C" in the last 72 hours. Lipid Profile No results for input(s): "CHOL", "HDL", "LDLCALC", "TRIG", "CHOLHDL", "LDLDIRECT" in the last 72 hours. Thyroid function studies No results for input(s): "TSH", "T4TOTAL", "T3FREE", "THYROIDAB" in the last 72 hours.  Invalid input(s): "FREET3" Anemia work up No results for input(s): "VITAMINB12", "FOLATE", "FERRITIN", "TIBC", "IRON", "RETICCTPCT" in the last 72  hours. Urinalysis No results found for: "COLORURINE", "APPEARANCEUR", "LABSPEC", "PHURINE", "GLUCOSEU", "HGBUR", "BILIRUBINUR", "KETONESUR", "PROTEINUR", "UROBILINOGEN", "NITRITE", "LEUKOCYTESUR" Sepsis Labs Recent Labs  Lab 01/25/22 0426 01/26/22 0923 01/27/22 0448 01/28/22 0545  WBC 8.8 9.7 9.9 8.2   Microbiology Recent Results (from the past 240 hour(s))  C Difficile Quick Screen w PCR reflex     Status: None   Collection Time: 01/25/22  9:32 PM   Specimen: STOOL  Result Value Ref Range Status   C Diff antigen NEGATIVE NEGATIVE Final   C Diff toxin NEGATIVE NEGATIVE Final   C Diff interpretation No C. difficile detected.  Final    Comment: Performed at [facility], [address] [place], [place]  [address]    Please note: You were cared for by a hospitalist during your hospital stay. Once you are discharged, your primary care physician will handle any further medical issues. Please note that NO REFILLS for any discharge medications will be authorized once you are discharged, as it is imperative that you return to your primary care physician (or establish a relationship with a primary care physician if you do not have one) for your post hospital discharge needs so that they can reassess your need for medications and monitor your lab values.    Time coordinating discharge: 40 minutes  SIGNED:   Burnadette Pop, MD  [facility] 01/28/2022, 12:06 PM Pager [phone]  If 7PM-7AM, please contact night-coverage [email or web address] Password [email or web address]

## 2022-01-28 NOTE — Progress Notes (Signed)
Patient is alert and oriented x4 but very hard of hearing. Denied pain. Has been having multiple diarrheal stools-diaper placed due to loose stools but checked frequently. Received a bath this morning after doing 0600 round and finding that patient has a very large bowel movement. Denied additional needs.

## 2022-01-28 NOTE — TOC Progression Note (Signed)
Transition of Care [facility]) - Progression Note    Patient Details  Name: Bianca Horton MRN: [id number] Date of Birth: 03-04-1936  Transition [facility]) CM/SW Contact  Marlowe Sax, RN Phone Number: 01/28/2022, 8:48 AM  Clinical Narrative:    [facility] approved [id number] 11/26-29   Expected Discharge Plan: Skilled Nursing Facility Barriers to Discharge: SNF Pending bed offer, Insurance Authorization  Expected Discharge Plan and Services Expected Discharge Plan: Skilled Nursing Facility       Living arrangements for the past 2 months: Assisted Living Facility                                       Social Determinants of Health (SDOH) Interventions    Readmission Risk Interventions     No data to display

## 2022-01-28 NOTE — Progress Notes (Signed)
Physical Therapy Treatment Patient Details Name: NATAYA BASTEDO MRN: [id number] DOB: December 20, 1935 Today's Date: 01/28/2022   History of Present Illness Baneen Wieseler is an 85yoF who presents after mechanical fall at facility while using walker, workup revealing of Left humeral deformity. Pt was here in 2nd week October after a fall and subsequent fractures to Rt acetabulum, Rt pubic Ramus, Rt sacral Ala, most recently advanced to WBAT BLE. PMH: HTN, HLD, AAA, hypothyroidism, depression, CKD stage III, tobacco abuse. Imaging reports revealing of "impacted, angulated, comminuted fractures of the proximal left humerus, involving at least the left surgical neck. Glenohumeral and acromioclavicular joints appear to be in anatomic apposition." Ortho consult recommending conservative management in sling, NWB.    PT Comments    To EOB with min/mod a x 1.  Initiates movement but unable to get upper body up off bed without pulling on writers hand with her right.  She is steady with sitting.  Stood with min/mod a x 1 and is able to transfer to chair with good ability to step today.  She is inc loose BM during transfer.  She is able to stand x 2 pulling up on lower bed rail with R and for care.  She needs max encouragement to remain standing as she quickly asks to sit once up and rushes pericare.    Overall improved movement today and activity tolerance.  SNF remain appropriate for discharge.   Recommendations for follow up therapy are one component of a multi-disciplinary discharge planning process, led by the attending physician.  Recommendations may be updated based on patient status, additional functional criteria and insurance authorization.  Follow Up Recommendations  Skilled nursing-short term rehab (<3 hours/day)     Assistance Recommended at Discharge Intermittent Supervision/Assistance  Patient can return home with the following A lot of help with walking and/or transfers;A lot of help with  bathing/dressing/bathroom;Assistance with cooking/housework;Assistance with feeding;Direct supervision/assist for medications management;Assist for transportation;Direct supervision/assist for financial management;Help with stairs or ramp for entrance   Equipment Recommendations  None recommended by PT    Recommendations for Other Services       Precautions / Restrictions Precautions Precautions: Fall Restrictions Weight Bearing Restrictions: Yes LUE Weight Bearing: Non weight bearing RLE Weight Bearing: Weight bearing as tolerated LLE Weight Bearing: Weight bearing as tolerated     Mobility  Bed Mobility Overal bed mobility: Needs Assistance Bed Mobility: Supine to Sit     Supine to sit: Min assist, Mod assist     General bed mobility comments: pulls up on writers hand    Transfers Overall transfer level: Needs assistance Equipment used: 1 person hand held assist Transfers: Sit to/from Stand, Bed to chair/wheelchair/BSC Sit to Stand: Min assist Stand pivot transfers: Mod assist, Min assist         General transfer comment: much better turning feet today and ability to step to chair.    Ambulation/Gait Ambulation/Gait assistance: Min assist Gait Distance (Feet): 3 Feet Assistive device: None Gait Pattern/deviations: Step-to pattern, Trunk flexed Gait velocity: decreased         Stairs             Wheelchair Mobility    Modified Rankin (Stroke Patients Only)       Balance Overall balance assessment: Needs assistance Sitting-balance support: Single extremity supported, Feet supported       Standing balance support: Single extremity supported, During functional activity Standing balance-Leahy Scale: Poor Standing balance comment: limited standing tolerance and poor motivation to remain standing.  Cognition Arousal/Alertness: Awake/alert Behavior During Therapy: WFL for tasks assessed/performed Overall  Cognitive Status: Within Functional Limits for tasks assessed                                          Exercises      General Comments        Pertinent Vitals/Pain Pain Assessment Pain Assessment: Faces Faces Pain Scale: Hurts a little bit Pain Location: L arm, w/ movement Pain Descriptors / Indicators: Guarding, Grimacing Pain Intervention(s): Limited activity within patient's tolerance, Monitored during session, Repositioned    Home Living                          Prior Function            PT Goals (current goals can now be found in the care plan section) Progress towards PT goals: Progressing toward goals    Frequency    7X/week      PT Plan Current plan remains appropriate    Co-evaluation              AM-PAC PT "6 Clicks" Mobility   Outcome Measure  Help needed turning from your back to your side while in a flat bed without using bedrails?: A Lot Help needed moving from lying on your back to sitting on the side of a flat bed without using bedrails?: A Little Help needed moving to and from a bed to a chair (including a wheelchair)?: A Lot Help needed standing up from a chair using your arms (e.g., wheelchair or bedside chair)?: A Little Help needed to walk in hospital room?: A Lot Help needed climbing 3-5 steps with a railing? : Total 6 Click Score: 13    End of Session Equipment Utilized During Treatment: Gait belt Activity Tolerance: Patient tolerated treatment well;Other (comment) (limited by frequent BMs) Patient left: Other (comment) (on [facility] with RN techs x 2 in room) Nurse Communication: Mobility status PT Visit Diagnosis: Unsteadiness on feet (R26.81);Other abnormalities of gait and mobility (R26.89);Difficulty in walking, not elsewhere classified (R26.2);Muscle weakness (generalized) (M62.81)     Time: 6283-1517 PT Time Calculation (min) (ACUTE ONLY): 17 min  Charges:  $Therapeutic Activity: 8-22 mins                     Danielle Dess, PTA 01/28/22, 11:12 AM

## 2022-01-28 NOTE — Care Management Important Message (Signed)
Important Message  Patient Details  Name: Bianca Horton MRN: [id number] Date of Birth: 05/25/1935   Medicare Important Message Given:  Yes     Olegario Messier A Alixandra Alfieri 01/28/2022, 12:10 PM

## 2022-01-28 NOTE — Progress Notes (Signed)
OT Cancellation Note  Patient Details Name: Bianca Horton MRN: [id number] DOB: [birth year]   Cancelled Treatment:    Reason Eval/Treat Not Completed: Patient declined, no reason specified OT attempted to engage patient in treatment session, but patient politely declined all opportunities for self care and therapeutic activity engagement.  She was comfortably resting in recliner, denied current needs.  Will continue to follow up.  Dennison Nancy, OTR/L 01/28/22, 11:37 AM

## 2022-01-28 NOTE — TOC Progression Note (Signed)
Transition of Care [facility]) - Progression Note    Patient Details  Name: Bianca Horton MRN: [id number] Date of Birth: [birth year]  [facility]) CM/SW Contact  Marlowe Sax, RN Phone Number: 01/28/2022, 12:51 PM  Clinical Narrative:     EMS called to transport to [facility]  Room [facility] Her nieces are aware   Expected Discharge Plan: Skilled Nursing Facility Barriers to Discharge: SNF Pending bed offer, Insurance Authorization  Expected Discharge Plan and Services Expected Discharge Plan: Skilled Nursing Facility       Living arrangements for the past 2 months: Assisted Living Facility Expected Discharge Date: 01/28/22                                     Social Determinants of Health (SDOH) Interventions    Readmission Risk Interventions     No data to display

## 2022-02-01 DIAGNOSIS — N183 Chronic kidney disease, stage 3 unspecified: Secondary | ICD-10-CM | POA: Diagnosis not present

## 2022-02-01 DIAGNOSIS — E038 Other specified hypothyroidism: Secondary | ICD-10-CM | POA: Diagnosis not present

## 2022-02-01 DIAGNOSIS — M6281 Muscle weakness (generalized): Secondary | ICD-10-CM | POA: Diagnosis not present

## 2022-02-01 DIAGNOSIS — K5909 Other constipation: Secondary | ICD-10-CM | POA: Diagnosis not present

## 2022-02-01 DIAGNOSIS — E782 Mixed hyperlipidemia: Secondary | ICD-10-CM | POA: Diagnosis not present

## 2022-02-01 DIAGNOSIS — W010XXD Fall on same level from slipping, tripping and stumbling without subsequent striking against object, subsequent encounter: Secondary | ICD-10-CM | POA: Diagnosis not present

## 2022-02-01 DIAGNOSIS — I10 Essential (primary) hypertension: Secondary | ICD-10-CM | POA: Diagnosis not present

## 2022-02-01 DIAGNOSIS — S32810D Multiple fractures of pelvis with stable disruption of pelvic ring, subsequent encounter for fracture with routine healing: Secondary | ICD-10-CM | POA: Diagnosis not present

## 2022-02-01 DIAGNOSIS — D500 Iron deficiency anemia secondary to blood loss (chronic): Secondary | ICD-10-CM | POA: Diagnosis not present

## 2022-02-04 DIAGNOSIS — W010XXD Fall on same level from slipping, tripping and stumbling without subsequent striking against object, subsequent encounter: Secondary | ICD-10-CM | POA: Diagnosis not present

## 2022-02-04 DIAGNOSIS — E038 Other specified hypothyroidism: Secondary | ICD-10-CM | POA: Diagnosis not present

## 2022-02-04 DIAGNOSIS — S32810D Multiple fractures of pelvis with stable disruption of pelvic ring, subsequent encounter for fracture with routine healing: Secondary | ICD-10-CM | POA: Diagnosis not present

## 2022-02-04 DIAGNOSIS — K5909 Other constipation: Secondary | ICD-10-CM | POA: Diagnosis not present

## 2022-02-04 DIAGNOSIS — E782 Mixed hyperlipidemia: Secondary | ICD-10-CM | POA: Diagnosis not present

## 2022-02-04 DIAGNOSIS — I10 Essential (primary) hypertension: Secondary | ICD-10-CM | POA: Diagnosis not present

## 2022-02-04 DIAGNOSIS — D500 Iron deficiency anemia secondary to blood loss (chronic): Secondary | ICD-10-CM | POA: Diagnosis not present

## 2022-02-04 DIAGNOSIS — N183 Chronic kidney disease, stage 3 unspecified: Secondary | ICD-10-CM | POA: Diagnosis not present

## 2022-02-04 DIAGNOSIS — M6281 Muscle weakness (generalized): Secondary | ICD-10-CM | POA: Diagnosis not present

## 2022-02-05 DIAGNOSIS — M6281 Muscle weakness (generalized): Secondary | ICD-10-CM | POA: Diagnosis not present

## 2022-02-05 DIAGNOSIS — I10 Essential (primary) hypertension: Secondary | ICD-10-CM | POA: Diagnosis not present

## 2022-02-05 DIAGNOSIS — E038 Other specified hypothyroidism: Secondary | ICD-10-CM | POA: Diagnosis not present

## 2022-02-05 DIAGNOSIS — D649 Anemia, unspecified: Secondary | ICD-10-CM | POA: Diagnosis not present

## 2022-02-05 DIAGNOSIS — E782 Mixed hyperlipidemia: Secondary | ICD-10-CM | POA: Diagnosis not present

## 2022-02-05 DIAGNOSIS — K5909 Other constipation: Secondary | ICD-10-CM | POA: Diagnosis not present

## 2022-02-05 DIAGNOSIS — N183 Chronic kidney disease, stage 3 unspecified: Secondary | ICD-10-CM | POA: Diagnosis not present

## 2022-02-05 DIAGNOSIS — W010XXD Fall on same level from slipping, tripping and stumbling without subsequent striking against object, subsequent encounter: Secondary | ICD-10-CM | POA: Diagnosis not present

## 2022-02-05 DIAGNOSIS — S32810D Multiple fractures of pelvis with stable disruption of pelvic ring, subsequent encounter for fracture with routine healing: Secondary | ICD-10-CM | POA: Diagnosis not present

## 2022-02-08 DIAGNOSIS — S32810D Multiple fractures of pelvis with stable disruption of pelvic ring, subsequent encounter for fracture with routine healing: Secondary | ICD-10-CM | POA: Diagnosis not present

## 2022-02-08 DIAGNOSIS — W010XXD Fall on same level from slipping, tripping and stumbling without subsequent striking against object, subsequent encounter: Secondary | ICD-10-CM | POA: Diagnosis not present

## 2022-02-08 DIAGNOSIS — K5909 Other constipation: Secondary | ICD-10-CM | POA: Diagnosis not present

## 2022-02-08 DIAGNOSIS — E782 Mixed hyperlipidemia: Secondary | ICD-10-CM | POA: Diagnosis not present

## 2022-02-08 DIAGNOSIS — N183 Chronic kidney disease, stage 3 unspecified: Secondary | ICD-10-CM | POA: Diagnosis not present

## 2022-02-08 DIAGNOSIS — M6281 Muscle weakness (generalized): Secondary | ICD-10-CM | POA: Diagnosis not present

## 2022-02-08 DIAGNOSIS — E038 Other specified hypothyroidism: Secondary | ICD-10-CM | POA: Diagnosis not present

## 2022-02-08 DIAGNOSIS — I10 Essential (primary) hypertension: Secondary | ICD-10-CM | POA: Diagnosis not present

## 2022-02-08 DIAGNOSIS — D500 Iron deficiency anemia secondary to blood loss (chronic): Secondary | ICD-10-CM | POA: Diagnosis not present

## 2022-02-11 DIAGNOSIS — S32402A Unspecified fracture of left acetabulum, initial encounter for closed fracture: Secondary | ICD-10-CM | POA: Diagnosis not present

## 2022-02-11 DIAGNOSIS — S32592A Other specified fracture of left pubis, initial encounter for closed fracture: Secondary | ICD-10-CM | POA: Diagnosis not present

## 2022-02-15 DIAGNOSIS — I10 Essential (primary) hypertension: Secondary | ICD-10-CM | POA: Diagnosis not present

## 2022-02-15 DIAGNOSIS — D500 Iron deficiency anemia secondary to blood loss (chronic): Secondary | ICD-10-CM | POA: Diagnosis not present

## 2022-02-15 DIAGNOSIS — E038 Other specified hypothyroidism: Secondary | ICD-10-CM | POA: Diagnosis not present

## 2022-02-15 DIAGNOSIS — N183 Chronic kidney disease, stage 3 unspecified: Secondary | ICD-10-CM | POA: Diagnosis not present

## 2022-02-15 DIAGNOSIS — E782 Mixed hyperlipidemia: Secondary | ICD-10-CM | POA: Diagnosis not present

## 2022-02-15 DIAGNOSIS — S32810D Multiple fractures of pelvis with stable disruption of pelvic ring, subsequent encounter for fracture with routine healing: Secondary | ICD-10-CM | POA: Diagnosis not present

## 2022-02-15 DIAGNOSIS — K5909 Other constipation: Secondary | ICD-10-CM | POA: Diagnosis not present

## 2022-02-15 DIAGNOSIS — M6281 Muscle weakness (generalized): Secondary | ICD-10-CM | POA: Diagnosis not present

## 2022-02-15 DIAGNOSIS — W010XXD Fall on same level from slipping, tripping and stumbling without subsequent striking against object, subsequent encounter: Secondary | ICD-10-CM | POA: Diagnosis not present

## 2022-02-18 DIAGNOSIS — S32810D Multiple fractures of pelvis with stable disruption of pelvic ring, subsequent encounter for fracture with routine healing: Secondary | ICD-10-CM | POA: Diagnosis not present

## 2022-02-18 DIAGNOSIS — K5909 Other constipation: Secondary | ICD-10-CM | POA: Diagnosis not present

## 2022-02-18 DIAGNOSIS — I10 Essential (primary) hypertension: Secondary | ICD-10-CM | POA: Diagnosis not present

## 2022-02-18 DIAGNOSIS — W010XXD Fall on same level from slipping, tripping and stumbling without subsequent striking against object, subsequent encounter: Secondary | ICD-10-CM | POA: Diagnosis not present

## 2022-02-18 DIAGNOSIS — N183 Chronic kidney disease, stage 3 unspecified: Secondary | ICD-10-CM | POA: Diagnosis not present

## 2022-02-18 DIAGNOSIS — E782 Mixed hyperlipidemia: Secondary | ICD-10-CM | POA: Diagnosis not present

## 2022-02-18 DIAGNOSIS — E038 Other specified hypothyroidism: Secondary | ICD-10-CM | POA: Diagnosis not present

## 2022-02-18 DIAGNOSIS — M6281 Muscle weakness (generalized): Secondary | ICD-10-CM | POA: Diagnosis not present

## 2022-02-18 DIAGNOSIS — D500 Iron deficiency anemia secondary to blood loss (chronic): Secondary | ICD-10-CM | POA: Diagnosis not present

## 2022-02-26 DIAGNOSIS — S32401D Unspecified fracture of right acetabulum, subsequent encounter for fracture with routine healing: Secondary | ICD-10-CM | POA: Diagnosis not present

## 2022-03-05 DIAGNOSIS — I10 Essential (primary) hypertension: Secondary | ICD-10-CM | POA: Diagnosis not present

## 2022-03-05 DIAGNOSIS — N183 Chronic kidney disease, stage 3 unspecified: Secondary | ICD-10-CM | POA: Diagnosis not present

## 2022-03-05 DIAGNOSIS — M6281 Muscle weakness (generalized): Secondary | ICD-10-CM | POA: Diagnosis not present

## 2022-03-05 DIAGNOSIS — S42202D Unspecified fracture of upper end of left humerus, subsequent encounter for fracture with routine healing: Secondary | ICD-10-CM | POA: Diagnosis not present

## 2022-03-06 DIAGNOSIS — S42212A Unspecified displaced fracture of surgical neck of left humerus, initial encounter for closed fracture: Secondary | ICD-10-CM | POA: Diagnosis not present

## 2022-03-06 DIAGNOSIS — S32402A Unspecified fracture of left acetabulum, initial encounter for closed fracture: Secondary | ICD-10-CM | POA: Diagnosis not present

## 2022-03-06 DIAGNOSIS — S32592A Other specified fracture of left pubis, initial encounter for closed fracture: Secondary | ICD-10-CM | POA: Diagnosis not present

## 2022-03-19 DIAGNOSIS — I10 Essential (primary) hypertension: Secondary | ICD-10-CM | POA: Diagnosis not present

## 2022-03-19 DIAGNOSIS — R051 Acute cough: Secondary | ICD-10-CM | POA: Diagnosis not present

## 2022-03-19 DIAGNOSIS — K5901 Slow transit constipation: Secondary | ICD-10-CM | POA: Diagnosis not present

## 2022-03-19 DIAGNOSIS — U071 COVID-19: Secondary | ICD-10-CM | POA: Diagnosis not present

## 2022-03-29 DIAGNOSIS — S32401D Unspecified fracture of right acetabulum, subsequent encounter for fracture with routine healing: Secondary | ICD-10-CM | POA: Diagnosis not present

## 2022-04-01 DIAGNOSIS — R262 Difficulty in walking, not elsewhere classified: Secondary | ICD-10-CM | POA: Diagnosis not present

## 2022-04-01 DIAGNOSIS — I10 Essential (primary) hypertension: Secondary | ICD-10-CM | POA: Diagnosis not present

## 2022-04-01 DIAGNOSIS — M25512 Pain in left shoulder: Secondary | ICD-10-CM | POA: Diagnosis not present

## 2022-04-01 DIAGNOSIS — M6281 Muscle weakness (generalized): Secondary | ICD-10-CM | POA: Diagnosis not present

## 2022-04-02 DIAGNOSIS — R262 Difficulty in walking, not elsewhere classified: Secondary | ICD-10-CM | POA: Diagnosis not present

## 2022-04-02 DIAGNOSIS — M6281 Muscle weakness (generalized): Secondary | ICD-10-CM | POA: Diagnosis not present

## 2022-04-02 DIAGNOSIS — M25512 Pain in left shoulder: Secondary | ICD-10-CM | POA: Diagnosis not present

## 2022-04-04 DIAGNOSIS — M6281 Muscle weakness (generalized): Secondary | ICD-10-CM | POA: Diagnosis not present

## 2022-04-04 DIAGNOSIS — R262 Difficulty in walking, not elsewhere classified: Secondary | ICD-10-CM | POA: Diagnosis not present

## 2022-04-04 DIAGNOSIS — M25512 Pain in left shoulder: Secondary | ICD-10-CM | POA: Diagnosis not present

## 2022-04-05 DIAGNOSIS — M25512 Pain in left shoulder: Secondary | ICD-10-CM | POA: Diagnosis not present

## 2022-04-05 DIAGNOSIS — M6281 Muscle weakness (generalized): Secondary | ICD-10-CM | POA: Diagnosis not present

## 2022-04-05 DIAGNOSIS — R262 Difficulty in walking, not elsewhere classified: Secondary | ICD-10-CM | POA: Diagnosis not present

## 2022-04-08 DIAGNOSIS — R262 Difficulty in walking, not elsewhere classified: Secondary | ICD-10-CM | POA: Diagnosis not present

## 2022-04-08 DIAGNOSIS — M25512 Pain in left shoulder: Secondary | ICD-10-CM | POA: Diagnosis not present

## 2022-04-08 DIAGNOSIS — M6281 Muscle weakness (generalized): Secondary | ICD-10-CM | POA: Diagnosis not present

## 2022-04-09 DIAGNOSIS — E782 Mixed hyperlipidemia: Secondary | ICD-10-CM | POA: Diagnosis not present

## 2022-04-09 DIAGNOSIS — M25512 Pain in left shoulder: Secondary | ICD-10-CM | POA: Diagnosis not present

## 2022-04-09 DIAGNOSIS — N183 Chronic kidney disease, stage 3 unspecified: Secondary | ICD-10-CM | POA: Diagnosis not present

## 2022-04-09 DIAGNOSIS — M6281 Muscle weakness (generalized): Secondary | ICD-10-CM | POA: Diagnosis not present

## 2022-04-09 DIAGNOSIS — R262 Difficulty in walking, not elsewhere classified: Secondary | ICD-10-CM | POA: Diagnosis not present

## 2022-04-09 DIAGNOSIS — I10 Essential (primary) hypertension: Secondary | ICD-10-CM | POA: Diagnosis not present

## 2022-04-09 DIAGNOSIS — R296 Repeated falls: Secondary | ICD-10-CM | POA: Diagnosis not present

## 2022-04-10 DIAGNOSIS — R262 Difficulty in walking, not elsewhere classified: Secondary | ICD-10-CM | POA: Diagnosis not present

## 2022-04-10 DIAGNOSIS — M25512 Pain in left shoulder: Secondary | ICD-10-CM | POA: Diagnosis not present

## 2022-04-10 DIAGNOSIS — M6281 Muscle weakness (generalized): Secondary | ICD-10-CM | POA: Diagnosis not present

## 2022-04-15 DIAGNOSIS — R262 Difficulty in walking, not elsewhere classified: Secondary | ICD-10-CM | POA: Diagnosis not present

## 2022-04-15 DIAGNOSIS — M25512 Pain in left shoulder: Secondary | ICD-10-CM | POA: Diagnosis not present

## 2022-04-15 DIAGNOSIS — M6281 Muscle weakness (generalized): Secondary | ICD-10-CM | POA: Diagnosis not present

## 2022-04-16 DIAGNOSIS — R262 Difficulty in walking, not elsewhere classified: Secondary | ICD-10-CM | POA: Diagnosis not present

## 2022-04-16 DIAGNOSIS — M6281 Muscle weakness (generalized): Secondary | ICD-10-CM | POA: Diagnosis not present

## 2022-04-16 DIAGNOSIS — M25512 Pain in left shoulder: Secondary | ICD-10-CM | POA: Diagnosis not present

## 2022-04-18 DIAGNOSIS — M6281 Muscle weakness (generalized): Secondary | ICD-10-CM | POA: Diagnosis not present

## 2022-04-18 DIAGNOSIS — R262 Difficulty in walking, not elsewhere classified: Secondary | ICD-10-CM | POA: Diagnosis not present

## 2022-04-18 DIAGNOSIS — M25512 Pain in left shoulder: Secondary | ICD-10-CM | POA: Diagnosis not present

## 2022-04-19 DIAGNOSIS — M6281 Muscle weakness (generalized): Secondary | ICD-10-CM | POA: Diagnosis not present

## 2022-04-19 DIAGNOSIS — R296 Repeated falls: Secondary | ICD-10-CM | POA: Diagnosis not present

## 2022-04-19 DIAGNOSIS — E782 Mixed hyperlipidemia: Secondary | ICD-10-CM | POA: Diagnosis not present

## 2022-04-19 DIAGNOSIS — K219 Gastro-esophageal reflux disease without esophagitis: Secondary | ICD-10-CM | POA: Diagnosis not present

## 2022-04-22 DIAGNOSIS — M6281 Muscle weakness (generalized): Secondary | ICD-10-CM | POA: Diagnosis not present

## 2022-04-22 DIAGNOSIS — R262 Difficulty in walking, not elsewhere classified: Secondary | ICD-10-CM | POA: Diagnosis not present

## 2022-04-22 DIAGNOSIS — M25512 Pain in left shoulder: Secondary | ICD-10-CM | POA: Diagnosis not present

## 2022-04-25 DIAGNOSIS — M25512 Pain in left shoulder: Secondary | ICD-10-CM | POA: Diagnosis not present

## 2022-04-25 DIAGNOSIS — M6281 Muscle weakness (generalized): Secondary | ICD-10-CM | POA: Diagnosis not present

## 2022-04-25 DIAGNOSIS — R262 Difficulty in walking, not elsewhere classified: Secondary | ICD-10-CM | POA: Diagnosis not present

## 2022-04-26 DIAGNOSIS — R262 Difficulty in walking, not elsewhere classified: Secondary | ICD-10-CM | POA: Diagnosis not present

## 2022-04-26 DIAGNOSIS — M25512 Pain in left shoulder: Secondary | ICD-10-CM | POA: Diagnosis not present

## 2022-04-26 DIAGNOSIS — M6281 Muscle weakness (generalized): Secondary | ICD-10-CM | POA: Diagnosis not present

## 2022-04-28 ENCOUNTER — Emergency Department: Payer: Medicare Other

## 2022-04-28 ENCOUNTER — Inpatient Hospital Stay
Admission: EM | Admit: 2022-04-28 | Discharge: 2022-05-07 | DRG: 956 | Disposition: A | Discharge status: Skilled Nursing Facility | Payer: Medicare Other | Attending: Student in an Organized Health Care Education/Training Program | Admitting: Student in an Organized Health Care Education/Training Program

## 2022-04-28 ENCOUNTER — Other Ambulatory Visit: Payer: Self-pay

## 2022-04-28 DIAGNOSIS — E785 Hyperlipidemia, unspecified: Secondary | ICD-10-CM | POA: Diagnosis present

## 2022-04-28 DIAGNOSIS — D62 Acute posthemorrhagic anemia: Secondary | ICD-10-CM | POA: Diagnosis not present

## 2022-04-28 DIAGNOSIS — I129 Hypertensive chronic kidney disease with stage 1 through stage 4 chronic kidney disease, or unspecified chronic kidney disease: Secondary | ICD-10-CM | POA: Diagnosis not present

## 2022-04-28 DIAGNOSIS — Z6821 Body mass index (BMI) 21.0-21.9, adult: Secondary | ICD-10-CM

## 2022-04-28 DIAGNOSIS — S72141A Displaced intertrochanteric fracture of right femur, initial encounter for closed fracture: Secondary | ICD-10-CM | POA: Diagnosis not present

## 2022-04-28 DIAGNOSIS — E861 Hypovolemia: Secondary | ICD-10-CM

## 2022-04-28 DIAGNOSIS — E538 Deficiency of other specified B group vitamins: Secondary | ICD-10-CM | POA: Diagnosis not present

## 2022-04-28 DIAGNOSIS — Z8616 Personal history of COVID-19: Secondary | ICD-10-CM | POA: Diagnosis not present

## 2022-04-28 DIAGNOSIS — F32A Depression, unspecified: Secondary | ICD-10-CM | POA: Diagnosis not present

## 2022-04-28 DIAGNOSIS — S42301A Unspecified fracture of shaft of humerus, right arm, initial encounter for closed fracture: Secondary | ICD-10-CM | POA: Diagnosis not present

## 2022-04-28 DIAGNOSIS — F03B4 Unspecified dementia, moderate, with anxiety: Secondary | ICD-10-CM | POA: Diagnosis present

## 2022-04-28 DIAGNOSIS — S32592S Other specified fracture of left pubis, sequela: Secondary | ICD-10-CM | POA: Diagnosis not present

## 2022-04-28 DIAGNOSIS — S32591A Other specified fracture of right pubis, initial encounter for closed fracture: Secondary | ICD-10-CM | POA: Diagnosis not present

## 2022-04-28 DIAGNOSIS — Z7982 Long term (current) use of aspirin: Secondary | ICD-10-CM | POA: Diagnosis not present

## 2022-04-28 DIAGNOSIS — F17200 Nicotine dependence, unspecified, uncomplicated: Secondary | ICD-10-CM | POA: Diagnosis not present

## 2022-04-28 DIAGNOSIS — R079 Chest pain, unspecified: Secondary | ICD-10-CM | POA: Diagnosis not present

## 2022-04-28 DIAGNOSIS — H9190 Unspecified hearing loss, unspecified ear: Secondary | ICD-10-CM | POA: Diagnosis present

## 2022-04-28 DIAGNOSIS — F17210 Nicotine dependence, cigarettes, uncomplicated: Secondary | ICD-10-CM | POA: Diagnosis not present

## 2022-04-28 DIAGNOSIS — S42211D Unspecified displaced fracture of surgical neck of right humerus, subsequent encounter for fracture with routine healing: Secondary | ICD-10-CM | POA: Diagnosis not present

## 2022-04-28 DIAGNOSIS — Z8249 Family history of ischemic heart disease and other diseases of the circulatory system: Secondary | ICD-10-CM | POA: Diagnosis not present

## 2022-04-28 DIAGNOSIS — S72001A Fracture of unspecified part of neck of right femur, initial encounter for closed fracture: Secondary | ICD-10-CM | POA: Diagnosis not present

## 2022-04-28 DIAGNOSIS — N1832 Chronic kidney disease, stage 3b: Secondary | ICD-10-CM | POA: Diagnosis not present

## 2022-04-28 DIAGNOSIS — Z66 Do not resuscitate: Secondary | ICD-10-CM | POA: Diagnosis not present

## 2022-04-28 DIAGNOSIS — M25511 Pain in right shoulder: Secondary | ICD-10-CM | POA: Diagnosis not present

## 2022-04-28 DIAGNOSIS — F03B3 Unspecified dementia, moderate, with mood disturbance: Secondary | ICD-10-CM | POA: Diagnosis present

## 2022-04-28 DIAGNOSIS — S32591S Other specified fracture of right pubis, sequela: Secondary | ICD-10-CM | POA: Diagnosis not present

## 2022-04-28 DIAGNOSIS — S32591D Other specified fracture of right pubis, subsequent encounter for fracture with routine healing: Secondary | ICD-10-CM | POA: Diagnosis not present

## 2022-04-28 DIAGNOSIS — S32592A Other specified fracture of left pubis, initial encounter for closed fracture: Secondary | ICD-10-CM | POA: Diagnosis present

## 2022-04-28 DIAGNOSIS — W19XXXA Unspecified fall, initial encounter: Secondary | ICD-10-CM | POA: Diagnosis not present

## 2022-04-28 DIAGNOSIS — N179 Acute kidney failure, unspecified: Secondary | ICD-10-CM | POA: Diagnosis not present

## 2022-04-28 DIAGNOSIS — R0902 Hypoxemia: Secondary | ICD-10-CM | POA: Diagnosis not present

## 2022-04-28 DIAGNOSIS — Z7401 Bed confinement status: Secondary | ICD-10-CM | POA: Diagnosis not present

## 2022-04-28 DIAGNOSIS — Z79899 Other long term (current) drug therapy: Secondary | ICD-10-CM

## 2022-04-28 DIAGNOSIS — W1830XA Fall on same level, unspecified, initial encounter: Secondary | ICD-10-CM | POA: Diagnosis present

## 2022-04-28 DIAGNOSIS — E7800 Pure hypercholesterolemia, unspecified: Secondary | ICD-10-CM | POA: Diagnosis present

## 2022-04-28 DIAGNOSIS — S199XXA Unspecified injury of neck, initial encounter: Secondary | ICD-10-CM | POA: Diagnosis not present

## 2022-04-28 DIAGNOSIS — Z720 Tobacco use: Secondary | ICD-10-CM | POA: Diagnosis present

## 2022-04-28 DIAGNOSIS — E039 Hypothyroidism, unspecified: Secondary | ICD-10-CM | POA: Diagnosis not present

## 2022-04-28 DIAGNOSIS — I9589 Other hypotension: Secondary | ICD-10-CM | POA: Diagnosis not present

## 2022-04-28 DIAGNOSIS — E440 Moderate protein-calorie malnutrition: Secondary | ICD-10-CM | POA: Diagnosis not present

## 2022-04-28 DIAGNOSIS — I10 Essential (primary) hypertension: Secondary | ICD-10-CM | POA: Diagnosis present

## 2022-04-28 DIAGNOSIS — I714 Abdominal aortic aneurysm, without rupture, unspecified: Secondary | ICD-10-CM | POA: Diagnosis not present

## 2022-04-28 DIAGNOSIS — R609 Edema, unspecified: Secondary | ICD-10-CM | POA: Diagnosis not present

## 2022-04-28 DIAGNOSIS — S72141D Displaced intertrochanteric fracture of right femur, subsequent encounter for closed fracture with routine healing: Secondary | ICD-10-CM | POA: Diagnosis not present

## 2022-04-28 DIAGNOSIS — S42201A Unspecified fracture of upper end of right humerus, initial encounter for closed fracture: Secondary | ICD-10-CM | POA: Diagnosis present

## 2022-04-28 DIAGNOSIS — D539 Nutritional anemia, unspecified: Secondary | ICD-10-CM | POA: Diagnosis present

## 2022-04-28 DIAGNOSIS — S32401D Unspecified fracture of right acetabulum, subsequent encounter for fracture with routine healing: Secondary | ICD-10-CM | POA: Diagnosis not present

## 2022-04-28 DIAGNOSIS — S0003XA Contusion of scalp, initial encounter: Secondary | ICD-10-CM | POA: Diagnosis not present

## 2022-04-28 DIAGNOSIS — S42291A Other displaced fracture of upper end of right humerus, initial encounter for closed fracture: Secondary | ICD-10-CM | POA: Diagnosis not present

## 2022-04-28 DIAGNOSIS — F418 Other specified anxiety disorders: Secondary | ICD-10-CM | POA: Diagnosis present

## 2022-04-28 DIAGNOSIS — D631 Anemia in chronic kidney disease: Secondary | ICD-10-CM | POA: Diagnosis present

## 2022-04-28 DIAGNOSIS — Z79890 Hormone replacement therapy (postmenopausal): Secondary | ICD-10-CM

## 2022-04-28 DIAGNOSIS — F0390 Unspecified dementia without behavioral disturbance: Secondary | ICD-10-CM

## 2022-04-28 DIAGNOSIS — S32592D Other specified fracture of left pubis, subsequent encounter for fracture with routine healing: Secondary | ICD-10-CM | POA: Diagnosis not present

## 2022-04-28 DIAGNOSIS — S42214A Unspecified nondisplaced fracture of surgical neck of right humerus, initial encounter for closed fracture: Secondary | ICD-10-CM | POA: Diagnosis not present

## 2022-04-28 DIAGNOSIS — M25519 Pain in unspecified shoulder: Secondary | ICD-10-CM | POA: Diagnosis not present

## 2022-04-28 DIAGNOSIS — Z743 Need for continuous supervision: Secondary | ICD-10-CM | POA: Diagnosis not present

## 2022-04-28 DIAGNOSIS — R6889 Other general symptoms and signs: Secondary | ICD-10-CM | POA: Diagnosis not present

## 2022-04-28 DIAGNOSIS — K5900 Constipation, unspecified: Secondary | ICD-10-CM | POA: Diagnosis not present

## 2022-04-28 DIAGNOSIS — F03A18 Unspecified dementia, mild, with other behavioral disturbance: Secondary | ICD-10-CM | POA: Diagnosis not present

## 2022-04-28 DIAGNOSIS — S42211A Unspecified displaced fracture of surgical neck of right humerus, initial encounter for closed fracture: Secondary | ICD-10-CM

## 2022-04-28 DIAGNOSIS — R600 Localized edema: Secondary | ICD-10-CM | POA: Diagnosis not present

## 2022-04-28 DIAGNOSIS — W19XXXD Unspecified fall, subsequent encounter: Secondary | ICD-10-CM | POA: Diagnosis not present

## 2022-04-28 DIAGNOSIS — Z801 Family history of malignant neoplasm of trachea, bronchus and lung: Secondary | ICD-10-CM

## 2022-04-28 LAB — BASIC METABOLIC PANEL
Anion gap: 6 (ref 5–15)
BUN: 23 mg/dL (ref 8–23)
CO2: 22 mmol/L (ref 22–32)
Calcium: 8.6 mg/dL — ABNORMAL LOW (ref 8.9–10.3)
Chloride: 107 mmol/L (ref 98–111)
Creatinine, Ser: 1.12 mg/dL — ABNORMAL HIGH (ref 0.44–1.00)
GFR, Estimated: 48 mL/min — ABNORMAL LOW (ref >60)
Glucose, Bld: 94 mg/dL (ref 70–99)
Potassium: 3.7 mmol/L (ref 3.5–5.1)
Sodium: 135 mmol/L (ref 135–145)

## 2022-04-28 LAB — IRON AND TIBC
Iron: 47 ug/dL (ref 28–170)
Saturation Ratios: 27 % (ref 10.4–31.8)
TIBC: 174 ug/dL — ABNORMAL LOW (ref 250–450)
UIBC: 127 ug/dL

## 2022-04-28 LAB — CBC WITH DIFFERENTIAL/PLATELET
Abs Immature Granulocytes: 0.07 10*3/uL (ref 0.00–0.07)
Basophils Absolute: 0 10*3/uL (ref 0.0–0.1)
Basophils Relative: 0 %
Eosinophils Absolute: 0.2 10*3/uL (ref 0.0–0.5)
Eosinophils Relative: 2 %
HCT: 22.5 % — ABNORMAL LOW (ref 36.0–46.0)
Hemoglobin: 7 g/dL — ABNORMAL LOW (ref 12.0–15.0)
Immature Granulocytes: 1 %
Lymphocytes Relative: 11 %
Lymphs Abs: 1.3 10*3/uL (ref 0.7–4.0)
MCH: 31.1 pg (ref 26.0–34.0)
MCHC: 31.1 g/dL (ref 30.0–36.0)
MCV: 100 fL (ref 80.0–100.0)
Monocytes Absolute: 0.8 10*3/uL (ref 0.1–1.0)
Monocytes Relative: 7 %
Neutro Abs: 8.9 10*3/uL — ABNORMAL HIGH (ref 1.7–7.7)
Neutrophils Relative %: 79 %
Platelets: 176 10*3/uL (ref 150–400)
RBC: 2.25 MIL/uL — ABNORMAL LOW (ref 3.87–5.11)
RDW: 18.3 % — ABNORMAL HIGH (ref 11.5–15.5)
WBC: 11.2 10*3/uL — ABNORMAL HIGH (ref 4.0–10.5)
nRBC: 0 % (ref 0.0–0.2)

## 2022-04-28 LAB — PREPARE RBC (CROSSMATCH)

## 2022-04-28 LAB — CBC
HCT: 28.4 % — ABNORMAL LOW (ref 36.0–46.0)
Hemoglobin: 9.2 g/dL — ABNORMAL LOW (ref 12.0–15.0)
MCH: 30.8 pg (ref 26.0–34.0)
MCHC: 32.4 g/dL (ref 30.0–36.0)
MCV: 95 fL (ref 80.0–100.0)
Platelets: 151 10*3/uL (ref 150–400)
RBC: 2.99 MIL/uL — ABNORMAL LOW (ref 3.87–5.11)
RDW: 18.6 % — ABNORMAL HIGH (ref 11.5–15.5)
WBC: 9.4 10*3/uL (ref 4.0–10.5)
nRBC: 0 % (ref 0.0–0.2)

## 2022-04-28 LAB — RETICULOCYTES
Immature Retic Fract: 26.2 % — ABNORMAL HIGH (ref 2.3–15.9)
RBC.: 2.23 MIL/uL — ABNORMAL LOW (ref 3.87–5.11)
Retic Count, Absolute: 65.8 10*3/uL (ref 19.0–186.0)
Retic Ct Pct: 3 % (ref 0.4–3.1)

## 2022-04-28 LAB — FERRITIN: Ferritin: 189 ng/mL (ref 11–307)

## 2022-04-28 LAB — PROTIME-INR
INR: 1.1 (ref 0.8–1.2)
Prothrombin Time: 14.4 seconds (ref 11.4–15.2)

## 2022-04-28 LAB — TYPE AND SCREEN

## 2022-04-28 LAB — FOLATE: Folate: 20.7 ng/mL (ref >5.9)

## 2022-04-28 LAB — APTT: aPTT: 29 seconds (ref 24–36)

## 2022-04-28 MED ORDER — ONDANSETRON HCL 4 MG/2ML IJ SOLN
4.0000 mg | Freq: Three times a day (TID) | INTRAMUSCULAR | Status: DC | PRN
  Filled 2022-04-28: qty 2

## 2022-04-28 MED ORDER — ADULT MULTIVITAMIN W/MINERALS CH
1.0000 | ORAL_TABLET | Freq: Every day | ORAL | Status: DC
  Administered 2022-04-29 – 2022-05-07 (×9): 1 via ORAL
  Filled 2022-04-28 (×9): qty 1

## 2022-04-28 MED ORDER — BUPROPION HCL ER (XL) 150 MG PO TB24
150.0000 mg | ORAL_TABLET | Freq: Every day | ORAL | Status: DC
  Administered 2022-04-29 – 2022-05-07 (×9): 150 mg via ORAL
  Filled 2022-04-28 (×9): qty 1

## 2022-04-28 MED ORDER — ACETAMINOPHEN 325 MG PO TABS: 650.0000 mg | ORAL_TABLET | Freq: Four times a day (QID) | ORAL | Status: DC | PRN

## 2022-04-28 MED ORDER — MORPHINE SULFATE (PF) 2 MG/ML IV SOLN: 2.0000 mg | INTRAVENOUS | Status: DC | PRN

## 2022-04-28 MED ORDER — MUPIROCIN 2 % EX OINT
1.0000 | TOPICAL_OINTMENT | Freq: Two times a day (BID) | CUTANEOUS | Status: AC
  Administered 2022-04-28 – 2022-05-03 (×10): 1 via NASAL
  Filled 2022-04-28 (×2): qty 22

## 2022-04-28 MED ORDER — OXYCODONE-ACETAMINOPHEN 5-325 MG PO TABS
1.0000 | ORAL_TABLET | ORAL | Status: DC | PRN
  Administered 2022-04-28: 1 via ORAL
  Filled 2022-04-28 (×2): qty 1

## 2022-04-28 MED ORDER — MORPHINE SULFATE (PF) 4 MG/ML IV SOLN
4.0000 mg | Freq: Once | INTRAVENOUS | Status: DC
  Administered 2022-04-28: 4 mg via INTRAVENOUS
  Filled 2022-04-28: qty 1

## 2022-04-28 MED ORDER — LEVOTHYROXINE SODIUM 50 MCG PO TABS
125.0000 ug | ORAL_TABLET | Freq: Every day | ORAL | Status: DC
  Administered 2022-04-29 – 2022-05-07 (×8): 125 ug via ORAL
  Filled 2022-04-28 (×10): qty 1

## 2022-04-28 MED ORDER — SENNOSIDES-DOCUSATE SODIUM 8.6-50 MG PO TABS: 1.0000 | ORAL_TABLET | Freq: Every evening | ORAL | Status: DC | PRN

## 2022-04-28 MED ORDER — OLANZAPINE 5 MG PO TABS
2.5000 mg | ORAL_TABLET | Freq: Every day | ORAL | Status: DC
  Administered 2022-04-28 – 2022-05-06 (×9): 2.5 mg via ORAL
  Filled 2022-04-28 (×9): qty 1

## 2022-04-28 MED ORDER — FOLIC ACID 1 MG PO TABS
1.0000 mg | ORAL_TABLET | Freq: Every day | ORAL | Status: DC
  Administered 2022-04-29 – 2022-05-07 (×9): 1 mg via ORAL
  Filled 2022-04-28 (×10): qty 1

## 2022-04-28 MED ORDER — HYDRALAZINE HCL 20 MG/ML IJ SOLN: 5.0000 mg | INTRAMUSCULAR | Status: DC | PRN

## 2022-04-28 MED ORDER — METHOCARBAMOL 500 MG PO TABS: 500.0000 mg | ORAL_TABLET | Freq: Three times a day (TID) | ORAL | Status: DC | PRN

## 2022-04-28 MED ORDER — ENSURE ENLIVE PO LIQD
237.0000 mL | Freq: Two times a day (BID) | ORAL | Status: DC
  Administered 2022-04-30 – 2022-05-07 (×13): 237 mL via ORAL

## 2022-04-28 MED ORDER — SODIUM CHLORIDE 0.9% IV SOLUTION: Freq: Once | INTRAVENOUS | Status: AC

## 2022-04-28 MED ORDER — SODIUM CHLORIDE 0.9 % IV SOLN: INTRAVENOUS | Status: DC

## 2022-04-28 MED ORDER — NICOTINE 21 MG/24HR TD PT24
21.0000 mg | MEDICATED_PATCH | Freq: Every day | TRANSDERMAL | Status: DC
  Administered 2022-04-29 – 2022-05-07 (×9): 21 mg via TRANSDERMAL
  Filled 2022-04-28 (×9): qty 1

## 2022-04-28 MED ORDER — CILOSTAZOL 100 MG PO TABS
100.0000 mg | ORAL_TABLET | Freq: Two times a day (BID) | ORAL | Status: DC
  Administered 2022-04-28 – 2022-05-07 (×18): 100 mg via ORAL
  Filled 2022-04-28 (×19): qty 1

## 2022-04-28 MED ORDER — ESCITALOPRAM OXALATE 10 MG PO TABS
10.0000 mg | ORAL_TABLET | Freq: Every day | ORAL | Status: DC
  Administered 2022-04-29 – 2022-05-07 (×9): 10 mg via ORAL
  Filled 2022-04-28 (×9): qty 1

## 2022-04-28 MED ORDER — SIMVASTATIN 20 MG PO TABS
40.0000 mg | ORAL_TABLET | Freq: Every day | ORAL | Status: DC
  Administered 2022-04-28 – 2022-05-06 (×9): 40 mg via ORAL
  Filled 2022-04-28 (×9): qty 2

## 2022-04-28 NOTE — ED Notes (Signed)
Advised nurse that patient has ready bed 

## 2022-04-28 NOTE — Progress Notes (Signed)
Initial Nutrition Assessment  DOCUMENTATION CODES:   Not applicable  INTERVENTION:   -Once diet is advanced, add:   -Ensure Enlive po BID, each supplement provides 350 kcal and 20 grams of protein -MVI with minerals daly  NUTRITION DIAGNOSIS:   Increased nutrient needs related to post-op healing as evidenced by estimated needs.  GOAL:   Patient will meet greater than or equal to 90% of their needs  MONITOR:   PO intake, Supplement acceptance, Diet advancement  REASON FOR ASSESSMENT:   Consult Assessment of nutrition requirement/status, Hip fracture protocol  ASSESSMENT:   Pt with a past history of hypertension, CKD, aortic aneurysm who presents due to a fall.  Pt admitted with rt hip and rt humerus fracture.  Pt unavailable at time of visit. Attempted to speak with pt via call to hospital room phone, however, unable to reach. RD unable to obtain further nutrition-related history or complete nutrition-focused physical exam at this time.    Pt NPO. She is awaiting orthopedics consult.    Reviewed wt hx; pt has experienced a 8.9% wt loss over the past 11 months, which is not significant for time frame, but concerning secondary yo advanced age and multiple co-morbidities.   Pt with increased nutritional needs for wound healing and would benefit from addition of oral nutrition supplements.   Medications reviewed and include 0.9% sodium chloride infusion @ 75 ml/hr.   Labs reviewed.   Diet Order:   Diet Order             Diet NPO time specified Except for: Sips with Meds, Ice Chips  Diet effective now                   EDUCATION NEEDS:   No education needs have been identified at this time  Skin:  Skin Assessment: Reviewed RN Assessment  Last BM:  Unknown  Height:   Ht Readings from Last 1 Encounters:  04/28/22 '5\' 4"'$  (1.626 m)    Weight:   Wt Readings from Last 1 Encounters:  04/28/22 56 kg    Ideal Body Weight:  54.5 kg  BMI:  Body mass  index is 21.19 kg/m.  Estimated Nutritional Needs:   Kcal:  1500-1700  Protein:  75-90 grams  Fluid:  > 1.5 L    Loistine Chance, RD, LDN, [place] Registered Dietitian II Certified Diabetes Care and Education Specialist Please refer to [facility] for RD and/or RD on-call/weekend/after hours pager

## 2022-04-28 NOTE — ED Provider Notes (Signed)
[facility] Provider Note    Event Date/Time   First MD Initiated Contact with Patient 04/28/22 0700     (approximate)   History   Chief Complaint: Fall   HPI  Bianca Horton is a 87 y.o. female with a past history of hypertension, CKD, aortic aneurysm who comes to the ED due to a fall.  Patient has ambulatory dysfunction at baseline, forgot that she is not able to walk safely, and this morning got out of bed and tried to walk, falling over and landing on her right side.  Complains of pain in the right shoulder.  No head injury, no headache or neck pain.  No chest pain or shortness of breath.  Patient reports she was in her usual state of health prior to this happening.  No recent illness, normal oral intake.     Physical Exam   Triage Vital Signs: ED Triage Vitals  Enc Vitals Group     BP 04/28/22 0710 (!) 147/69     Pulse Rate 04/28/22 0710 77     Resp 04/28/22 0710 20     Temp 04/28/22 0710 98.2 F (36.8 C)     Temp src --      SpO2 04/28/22 0710 95 %     Weight 04/28/22 0711 123 lb 7.3 oz (56 kg)     Height 04/28/22 0711 '5\' 4"'$  (1.626 m)     Head Circumference --      Peak Flow --      Pain Score --      Pain Loc --      Pain Edu? --      Excl. in [place]? --     Most recent vital signs: Vitals:   04/28/22 0710  BP: (!) 147/69  Pulse: 77  Resp: 20  Temp: 98.2 F (36.8 C)  SpO2: 95%    General: Awake, no distress.  CV:  Good peripheral perfusion.  Regular rate and rhythm Resp:  Normal effort.  Clear to auscultation bilaterally Abd:  No distention.  Soft nontender Other:  Tenderness and swelling at the right proximal humerus.  Pain with passive range of motion of the right humerus.  Also tenderness at the right hip with some right leg shortening although no pain with passive range of motion.  Skin intact   ED Results / Procedures / Treatments   Labs (all labs ordered are listed, but only abnormal results are displayed) Labs Reviewed   BASIC METABOLIC PANEL  CBC WITH DIFFERENTIAL/PLATELET  TYPE AND SCREEN     EKG Interpreted by me Sinus rhythm rate of 81.  Normal axis, normal intervals.  Normal QRS ST segments and T waves.  No ischemic changes.   RADIOLOGY X-ray right humerus interpreted by me, shows displaced fracture of right proximal humerus at the neck.  Radiology report reviewed.  X-ray right hip interpreted by me, shows a comminuted intertrochanteric fracture of the right proximal femur.  Radiology report reviewed.  Chest x-ray negative for intrathoracic findings.  CT head and cervical spine pending   PROCEDURES:  Procedures   MEDICATIONS ORDERED IN ED: Medications - No data to display   IMPRESSION / MDM / [place] / ED COURSE  I reviewed the triage vital signs and the nursing notes.  DDx: Hip fracture, humerus fracture, contusion, shoulder dislocation  Patient's presentation is most consistent with acute presentation with potential threat to life or bodily function.  Patient presents with musculoskeletal complaints after mechanical  fall.  Vital signs are unremarkable, no other acute complaints.  Will obtain x-rays.  ----------------------------------------- 8:28 AM on 04/28/2022 ----------------------------------------- X-rays show right intertrochanteric fracture and right proximal humerus fracture.  Will need to admit for further management.  Discussed with orthopedics Dr. Sharlet Salina who recommends x-ray right shoulder series and CT right shoulder.  Will admit to hospitalist.      FINAL CLINICAL IMPRESSION(S) / ED DIAGNOSES   Final diagnoses:  Closed fracture of right hip, initial encounter [facility])  Closed fracture of neck of right humerus, initial encounter     Rx / DC Orders   ED Discharge Orders     None        Note:  This document was prepared using Dragon voice recognition software and may include unintentional dictation errors.   Carrie Mew,  MD 04/28/22 0830

## 2022-04-28 NOTE — H&P (Signed)
History and Physical    Bianca Horton [id number] DOB: 07-13-1935 DOA: 04/28/2022  Referring MD/NP/PA:   PCP: Perrin Maltese, MD   Patient coming from:  The patient is coming from SNF    Chief Complaint: fall, and pain in right shoulder and right hip  HPI: Bianca Horton is a 87 y.o. female with medical history significant of HTN, HLD, depression, CKD-3b, anemia, AAA, HOH, early stage of dementia, hypothyroidism, who presents with fall, pain right shoulder and right hip.  Patient has ambulatory dysfunction at baseline, and is bed bound. This early morning, pt forgot that she is not able to walk safely, and this morning got out of bed and tried to walk, falling over and landing on her right side. No LOC. She injured her right shoulder and right hip, causing pain which is constant, sharp, severe, nonradiating, aggravated by movement.  Patient does not have chest pain, cough, shortness breath.  No nausea, vomiting, diarrhea or abdominal pain.  No symptoms of UTI.  Denies headache or neck pain.  Mental status is at baseline.  Data reviewed independently and ED Course: pt was found to have WBC 11.2, hemoglobin 7.0 (7.8 on 01/28/2022), stable renal function, temperature normal, blood pressure 147/69, 97/69, heart rate 77, RR 20, oxygen saturation 95% on room air.  Chest x-ray negative.  CT of C-spine negative.  CT of head is negative for acute intracranial abnormalities, but showed mild right posterior parietal scalp hematoma.  Chest x-ray negative.  X-ray showed right shoulder and right hip fracture. Patient is admitted to [facility] bed as inpatient.  Dr. Sharlet Salina of Ortho is consulted.  CT-right shoulder: 1. Acute comminuted fracture of the right humeral head and neck with significant displacement, as described above. 2. Small to moderate sized layering right-sided pleural effusion.   X-ray of right hip/pelvis: 1. Acute highly comminuted right femur intertrochanteric fracture with varus  impaction. 2. Nonacute bilateral pubic rami fractures, nondisplaced and subtle on imaging in October. 3. Extensive calcified atherosclerosis.   EKG: I have personally reviewed.  Sinus rhythm, QTc 476, low voltage.   Review of Systems:   General: no fevers, chills, no body weight gain, has fatigue HEENT: no blurry vision, or sore throat. HOH Respiratory: no dyspnea, coughing, wheezing CV: no chest pain, no palpitations GI: no nausea, vomiting, abdominal pain, diarrhea, constipation GU: no dysuria, burning on urination, increased urinary frequency, hematuria  Ext: no leg edema Neuro: no unilateral weakness, numbness, or tingling, no vision change or hearing loss. Has fall Skin: no rash, no skin tear. MSK: has pain in right shoulder and right hip Heme: No easy bruising.  Travel history: No recent long distant travel.   Allergy:  Allergies  Allergen Reactions   Clopidogrel     Other reaction(s): brusing skin   Sertraline Hcl Nausea And Vomiting    Other reaction(s): Nausea, Vomiting, Diarrhea, sleepwalking, Nausea, Vomiting, Diarrhea, sleepwalking    Past Medical History:  Diagnosis Date   High cholesterol    Hypertension    Thyroid disease     Past Surgical History:  Procedure Laterality Date   GALLBLADDER SURGERY      Social History:  reports that she has been smoking. She has never used smokeless tobacco. She reports that she does not drink alcohol and does not use drugs.  Family History:  Family History  Problem Relation Age of Onset   Heart disease Mother    Heart disease Father    Lung cancer Sister  Prior to Admission medications   Medication Sig Start Date End Date Taking? Authorizing Provider  OLANZapine (ZYPREXA) 2.5 MG tablet Take 2.5 mg by mouth at bedtime. 04/20/22  Yes [provider]  acetaminophen (TYLENOL) 500 MG tablet Take 1,000 mg by mouth 3 (three) times daily as needed for mild pain.    [provider]  aspirin 81 MG  chewable tablet Chew 81 mg by mouth daily.    [provider]  buPROPion (WELLBUTRIN XL) 150 MG 24 hr tablet Take 150 mg by mouth daily. 11/27/15   [provider]  calcium carbonate (TUMS - DOSED IN MG ELEMENTAL CALCIUM) 500 MG chewable tablet Chew 1 tablet by mouth 2 (two) times daily as needed for indigestion or heartburn.    [provider]  carvedilol (COREG) 25 MG tablet Take 25 mg by mouth 2 (two) times daily with a meal. 07/22/13   [provider]  chlorhexidine (PERIDEX) 0.12 % solution Use as directed 15 mLs in the mouth or throat 2 (two) times daily. 01/01/22   [provider]  cilostazol (PLETAL) 100 MG tablet Take 100 mg by mouth 2 (two) times daily.    [provider]  enalapril (VASOTEC) 2.5 MG tablet Take 2.5 mg by mouth daily. 11/12/21   [provider]  escitalopram (LEXAPRO) 10 MG tablet Take 10 mg by mouth daily. 11/19/21   [provider]  folic acid (FOLVITE) 1 MG tablet Take 1 mg by mouth daily.    [provider]  levothyroxine (SYNTHROID) 125 MCG tablet Take 125 mcg by mouth every morning. 01/24/21   [provider]  Menthol, Topical Analgesic, (ICY HOT ADVANCED RELIEF EX) Apply 1 Application topically as needed.    [provider]  oxyCODONE-acetaminophen (PERCOCET/ROXICET) 5-325 MG tablet Take 1 tablet by mouth 4 (four) times daily as needed for moderate pain. 01/28/22   Shelly Coss, MD  polyethylene glycol (MIRALAX) 17 g packet Take 17 g by mouth daily. 01/28/22   Shelly Coss, MD  risperiDONE (RISPERDAL) 0.25 MG tablet Take 0.25 mg by mouth 2 (two) times daily as needed. 10/26/21   [provider]  senna-docusate (SENOKOT-S) 8.6-50 MG tablet Take 1 tablet by mouth at bedtime as needed for mild constipation. 01/28/22   Shelly Coss, MD  simvastatin (ZOCOR) 40 MG tablet Take 40 mg by mouth at bedtime. 07/22/13   [provider]    Physical  Exam: Vitals:   04/28/22 0930 04/28/22 1000 04/28/22 1047 04/28/22 1141  BP: 97/69 (!) 111/49 (!) 94/52   Pulse: 88 86 85   Resp: '16 18 20   '$ Temp: 98 F (36.7 C)  97.8 F (36.6 C)   TempSrc:   Oral   SpO2: 98% 96% 92% 94%  Weight:      Height:       General: Not in acute distress HEENT:       Eyes: PERRL, EOMI, no scleral icterus.       ENT: No discharge from the ears and nose, no pharynx injection, no tonsillar enlargement.        Neck: No JVD, no bruit, no mass felt. Heme: No neck lymph node enlargement. Cardiac: S1/S2, RRR, No murmurs, No gallops or rubs. Respiratory: No rales, wheezing, rhonchi or rubs. GI: Soft, nondistended, nontender, no rebound pain, no organomegaly, BS present. GU: No hematuria Ext: No pitting leg edema bilaterally. 1+DP/PT pulse bilaterally. Musculoskeletal: has pain in right shoulder and hip. Right leg is mildly rotated externally Skin:  No rashes.  Neuro: Alert, answers most questions appropriately, orientated to place and person, confused about year 2024 to 2004,  cranial nerves II-XII grossly intact except for [facility], moves all extremities. Psych: Patient is not psychotic, no suicidal or hemocidal ideation.  Labs on Admission: I have personally reviewed following labs and imaging studies  CBC: Recent Labs  Lab 04/28/22 0842  WBC 11.2*  NEUTROABS 8.9*  HGB 7.0*  HCT 22.5*  MCV 100.0  PLT 0000000   Basic Metabolic Panel: Recent Labs  Lab 04/28/22 0842  NA 135  K 3.7  CL 107  CO2 22  GLUCOSE 94  BUN 23  CREATININE 1.12*  CALCIUM 8.6*   GFR: Estimated Creatinine Clearance: 32.3 mL/min (A) (by C-G formula based on SCr of 1.12 mg/dL (H)). Liver Function Tests: No results for input(s): "AST", "ALT", "ALKPHOS", "BILITOT", "PROT", "ALBUMIN" in the last 168 hours. No results for input(s): "LIPASE", "AMYLASE" in the last 168 hours. No results for input(s): "AMMONIA" in the last 168 hours. Coagulation Profile: Recent Labs  Lab 04/28/22 0842   INR 1.1   Cardiac Enzymes: No results for input(s): "CKTOTAL", "CKMB", "CKMBINDEX", "TROPONINI" in the last 168 hours. BNP (last 3 results) No results for input(s): "PROBNP" in the last 8760 hours. HbA1C: No results for input(s): "HGBA1C" in the last 72 hours. CBG: No results for input(s): "GLUCAP" in the last 168 hours. Lipid Profile: No results for input(s): "CHOL", "HDL", "LDLCALC", "TRIG", "CHOLHDL", "LDLDIRECT" in the last 72 hours. Thyroid Function Tests: No results for input(s): "TSH", "T4TOTAL", "FREET4", "T3FREE", "THYROIDAB" in the last 72 hours. Anemia Panel: Recent Labs    04/28/22 0842  FOLATE 20.7  FERRITIN 189  TIBC 174*  IRON 47  RETICCTPCT 3.0   Urine analysis: No results found for: "COLORURINE", "APPEARANCEUR", "LABSPEC", "PHURINE", "GLUCOSEU", "HGBUR", "BILIRUBINUR", "KETONESUR", "PROTEINUR", "UROBILINOGEN", "NITRITE", "LEUKOCYTESUR" Sepsis Labs: '@LABRCNTIP'$ (procalcitonin:4,lacticidven:4) )No results found for this or any previous visit (from the past 240 hour(s)).   Radiological Exams on Admission: CT Shoulder Right Wo Contrast  Result Date: 04/28/2022 CLINICAL DATA:  Shoulder trauma, fracture of humerus EXAM: CT OF THE UPPER RIGHT EXTREMITY WITHOUT CONTRAST TECHNIQUE: Multidetector CT imaging of the upper right extremity was performed according to the standard protocol. RADIATION DOSE REDUCTION: This exam was performed according to the departmental dose-optimization program which includes automated exposure control, adjustment of the mA and/or kV according to patient size and/or use of iterative reconstruction technique. COMPARISON:  X-ray 04/28/2022 FINDINGS: Bones/Joint/Cartilage Acute comminuted fracture of the right humeral head and neck. Transversely oriented fracture component through the surgical neck is significantly displaced anteromedially relative to the humeral head. Multiple adjacent small comminuted fracture fragments. Nondisplaced fracture  involves the greater tuberosity of the proximal humerus. No fracture involvement of the humeral head articular surface. Glenohumeral joint alignment is maintained without dislocation. Remaining osseous structures are intact. No additional fractures. AC joint within normal limits. Ligaments Suboptimally assessed by CT. Muscles and Tendons Preserved rotator cuff muscle bulk. Grossly intact tendons, not well evaluated by CT. Soft tissues Soft tissue swelling and ill-defined hematoma at the fracture site. No soft tissue air to suggest open fracture. No right axillary lymphadenopathy. Atherosclerotic vascular calcifications. Emphysematous changes within the lung fields with scarring or atelectasis in the right upper lobe. There is a small to moderate sized layering right-sided pleural effusion. IMPRESSION: 1. Acute comminuted fracture of the right humeral head and neck with significant displacement, as described above. 2. Small to moderate sized layering right-sided pleural effusion. Electronically Signed  By: Davina Poke D.O.   On: 04/28/2022 09:37   CT Cervical Spine Wo Contrast  Result Date: 04/28/2022 CLINICAL DATA:  Neck trauma (Age >= 65y) EXAM: CT CERVICAL SPINE WITHOUT CONTRAST TECHNIQUE: Multidetector CT imaging of the cervical spine was performed without intravenous contrast. Multiplanar CT image reconstructions were also generated. RADIATION DOSE REDUCTION: This exam was performed according to the departmental dose-optimization program which includes automated exposure control, adjustment of the mA and/or kV according to patient size and/or use of iterative reconstruction technique. COMPARISON:  01/23/2022 FINDINGS: Alignment: Facet joints are aligned without dislocation or traumatic listhesis. Dens and lateral masses are aligned. Skull base and vertebrae: No acute fracture. No primary bone lesion or focal pathologic process. Soft tissues and spinal canal: No prevertebral fluid or swelling. No  visible canal hematoma. Disc levels: Moderate-advanced multilevel degenerative disc disease, similar to prior. Upper chest: Emphysematous changes within the included lung apices. Other: Bilateral carotid atherosclerosis. IMPRESSION: No acute fracture or traumatic listhesis of the cervical spine. Electronically Signed   By: Davina Poke D.O.   On: 04/28/2022 09:30   CT Head Wo Contrast  Result Date: 04/28/2022 CLINICAL DATA:  Fall while getting out of bed this morning. Blunt head trauma. EXAM: CT HEAD WITHOUT CONTRAST TECHNIQUE: Contiguous axial images were obtained from the base of the skull through the vertex without intravenous contrast. RADIATION DOSE REDUCTION: This exam was performed according to the departmental dose-optimization program which includes automated exposure control, adjustment of the mA and/or kV according to patient size and/or use of iterative reconstruction technique. COMPARISON:  01/23/2022 FINDINGS: Brain: No evidence of intracranial hemorrhage, acute infarction, hydrocephalus, extra-axial collection, or mass lesion/mass effect. Mild diffuse cerebral atrophy and chronic small vessel disease are unchanged in appearance. Vascular:  No hyperdense vessel or other acute findings. Skull: No evidence of fracture or other significant bone abnormality. Mild right posterior parietal scalp hematoma is noted. Sinuses/Orbits: Near-complete opacification of both maxillary sinuses is seen. This area was not visualized on previous head CT. Other: None. IMPRESSION: Mild right posterior parietal scalp hematoma. No evidence of skull fracture. No acute intracranial abnormality. Stable mild cerebral atrophy and chronic small vessel disease. Bilateral maxillary sinusitis. Electronically Signed   By: Marlaine Hind M.D.   On: 04/28/2022 09:27   DG Shoulder Right  Result Date: 04/28/2022 CLINICAL DATA:  RIGHT humerus fracture, surgical planning. EXAM: RIGHT SHOULDER - 2+ VIEW COMPARISON:  None Available.  FINDINGS: Significantly displaced transverse fracture of the RIGHT humeral neck, with anterior displacement of the distal fracture fragment and overriding of the main fracture fragments. RIGHT humeral head remains grossly well positioned relative to the glenoid fossa overlying acromioclavicular joint space is normally aligned. IMPRESSION: Significantly displaced transverse fracture of the RIGHT humeral neck, with anterior displacement of the distal fracture fragment and complete overriding of the main fracture fragments. Electronically Signed   By: Franki Cabot M.D.   On: 04/28/2022 08:58   DG Chest 1 View  Result Date: 04/28/2022 CLINICAL DATA:  87 year old female status post fall with right hip pain. EXAM: CHEST  1 VIEW COMPARISON:  Right humerus series today.  Portable chest 12/06/2021. FINDINGS: Acute displaced and over riding proximal right humerus fracture, see comparison. The proximal left humerus also appears to be abnormal, new since October although with evidence of regional periosteal new bone formation. Lung volumes and mediastinal contours remain within normal limits. Extensive Calcified aortic atherosclerosis. Visualized tracheal air column is within normal limits. Allowing for portable technique the lungs are clear.  No pneumothorax or pleural effusion. Cholecystectomy clips. No rib fracture identified. Negative visible bowel gas. IMPRESSION: 1. No acute cardiopulmonary abnormality. 2. Acute displaced and over riding proximal right humerus fracture, see comparison. 3. Proximal left humerus also appears to be abnormal, new since October but with periosteal bone formation suggesting a nonacute fracture. 4.  Aortic Atherosclerosis (ICD10-I70.0). Electronically Signed   By: Genevie Ann M.D.   On: 04/28/2022 08:18   DG Humerus Right  Result Date: 04/28/2022 CLINICAL DATA:  87 year old female status post fall with right hip pain. EXAM: RIGHT HUMERUS - 2+ VIEW COMPARISON:  Portable chest x-ray  12/06/2021. FINDINGS: Transverse fracture of the proximal right humerus appears to be through the neck and there is greater than 1 full shaft width anterior displacement and over-riding the entire with of the humeral head, at least 5 cm. The distal humerus fragment is nearly impacted on the coracoid. The right humeral head appears to remain aligned with the glenoid. The more distal right humerus and right elbow appear grossly intact. Visible right ribs appear to remain intact. Stable right lung aeration. Extensive right axillary calcified atherosclerosis. IMPRESSION: Acute transverse fracture of the proximal right humerus neck, with complete anterior displacement and over riding of the humeral head such that the distal fracture fragment is nearly impacted on the coracoid. Electronically Signed   By: Genevie Ann M.D.   On: 04/28/2022 08:15   DG Hip Unilat W or Wo Pelvis 2-3 Views Right  Result Date: 04/28/2022 CLINICAL DATA:  87 year old female status post fall with right hip pain. EXAM: DG HIP (WITH OR WITHOUT PELVIS) 2-3V RIGHT COMPARISON:  Pelvis radiograph 12/06/2021. Pelvis CT 12/06/2021. FINDINGS: Highly comminuted right femur intertrochanteric fracture with varus impaction. Right femoral head appears to remain normally located. Lesser and greater trochanter butterfly fragments. There also appear to be bilateral pubic rami fractures. And although these were not apparent on the prior radiograph, nondisplaced ramus fractures were demonstrated on the CT at that time. Left femoral head remains normally located. Grossly intact proximal left femur. Pelvis elsewhere appears intact. Extensive aortoiliac and femoral calcified atherosclerosis. Negative visible bowel gas. IMPRESSION: 1. Acute highly comminuted right femur intertrochanteric fracture with varus impaction. 2. Nonacute bilateral pubic rami fractures, nondisplaced and subtle on imaging in October. 3. Extensive calcified atherosclerosis. Electronically Signed    By: Genevie Ann M.D.   On: 04/28/2022 08:13      Assessment/Plan Principal Problem:   Closed right hip fracture [facility]) Active Problems:   Closed fracture of right proximal humerus   Bilateral pubic rami fractures (HCC)   Essential hypertension   Stage 3b chronic kidney disease (CKD) ([place])   Fall at home, initial encounter   Hyperlipidemia   Hypothyroidism   Macrocytic anemia   Tobacco abuse   Depression with anxiety   Assessment and Plan:  Closed right hip fracture and closed fracture of right proximal humerus: pt has acute comminuted fracture of the right humeral head and neck with significant displacement, and acute highly comminuted right femur intertrochanteric fracture with varus impaction. Patient has moderate pain now. No neurovascular compromise. Dr. Sharlet Salina of ortho is consulted.  - will admit to Med-surg bed - Pain control: prn morphine, percocet and tyleno - When necessary Zofran for nausea - Robaxin for muscle spasm - type and cross - INR/PTT - PT/OT when able to (not ordered now)  Bilateral pubic rami fracture: CT also showed nonacute bilateral pubic rami fractures, nondisplaced and subtle -pt control as above  Leukocytosis: mild. WBC  11.2. Likely due to stress-induced demargination. Patient does not have signs of infection. -Follow-up CBC  Essential hypertension: Blood pressure is soft, 97/69 -will hold Coreg and enalapril -IV hydralazine as needed  Stage 3b chronic kidney disease (CKD) ([place]): stable -f/u with CBC  Fall at home, initial encounter -need PT/OT when able to   Hyperlipidemia -Zocor  Hypothyroidism -Synthroid  Macrocytic anemia: Hemoglobin 7.8 on 01/28/2022.  Creatinine is 7.0 today -Will transfuse 2 unit of blood for surgery -Check anemia panel  Tobacco abuse -Nicotine patch  Depression and anxiety -Continue home medication: Olanzapine, Lexapro, Wellbutrin   Perioperative Cardiac Risk: pt has multiple comorbidities as listed  above. Pt has chronic ambulatory dysfunction, basically bedbound.  Patient does not have history of cardiac issues.  Currently no chest pain, cough, shortness breath, palpitation or leg edema.  No signs of CHF. EKG has no acute change. At this time point, no further work up is needed. Patient's GUPTA score perioperative myocardial infarction or cardaic arrest is 2.44 %.  I discussed the risk with her niece who is her power of attorney, they would like to proceed for surgery.     DVT ppx: SCD  Code Status: DNR per her niece who is POA  Family Communication:  Yes, patient's niece by phone  Disposition Plan:  Anticipate discharge back to previous environment  Consults called:  Dr. Sharlet Salina of Ortho is consulted.  Admission status and Level of care: Med-Surg:   as inpt      Dispo: The patient is from: SNF              Anticipated d/c is to: SNF              Anticipated d/c date is: 2 days              Patient currently is not medically stable to d/c.    Severity of Illness:  The appropriate patient status for this patient is INPATIENT. Inpatient status is judged to be reasonable and necessary in order to provide the required intensity of service to ensure the patient's safety. The patient's presenting symptoms, physical exam findings, and initial radiographic and laboratory data in the context of their chronic comorbidities is felt to place them at high risk for further clinical deterioration. Furthermore, it is not anticipated that the patient will be medically stable for discharge from the hospital within 2 midnights of admission.   * I certify that at the point of admission it is my clinical judgment that the patient will require inpatient hospital care spanning beyond 2 midnights from the point of admission due to high intensity of service, high risk for further deterioration and high frequency of surveillance required.*       Date of Service 04/28/2022    [facility]   If 7PM-7AM, please contact night-coverage [email or web address] 04/28/2022, 12:59 PM

## 2022-04-28 NOTE — ED Triage Notes (Signed)
Pt arrives via [facility] w/ c/o fall this am. EMS reports pt forgot she couldn't walk, got out of bed, fell landing on her R shoulder. Pt endorsing R shoulder pain, no other complaints. No obvious deformities reported. Pt states it hurts to move her arm but is able to move wrist and hand. Pt denies hitting head, denies thinners. Denies LOC.  Pt AO3, baseline per EMS.  VSS

## 2022-04-29 ENCOUNTER — Other Ambulatory Visit: Payer: Self-pay

## 2022-04-29 ENCOUNTER — Encounter: Payer: Self-pay | Admitting: Internal Medicine

## 2022-04-29 ENCOUNTER — Encounter
Admission: EM | Disposition: A | Discharge status: Skilled Nursing Facility | Payer: Self-pay | Source: Home / Self Care | Attending: Student in an Organized Health Care Education/Training Program

## 2022-04-29 ENCOUNTER — Inpatient Hospital Stay: Payer: Medicare Other

## 2022-04-29 ENCOUNTER — Inpatient Hospital Stay: Payer: Medicare Other | Admitting: Anesthesiology

## 2022-04-29 DIAGNOSIS — S72001A Fracture of unspecified part of neck of right femur, initial encounter for closed fracture: Secondary | ICD-10-CM | POA: Diagnosis not present

## 2022-04-29 HISTORY — PX: INTRAMEDULLARY (IM) NAIL INTERTROCHANTERIC: SHX5875

## 2022-04-29 HISTORY — PX: SHOULDER CLOSED REDUCTION: SHX1051

## 2022-04-29 LAB — CBC
HCT: 23.9 % — ABNORMAL LOW (ref 36.0–46.0)
Hemoglobin: 7.7 g/dL — ABNORMAL LOW (ref 12.0–15.0)
MCH: 30.7 pg (ref 26.0–34.0)
MCHC: 32.2 g/dL (ref 30.0–36.0)
MCV: 95.2 fL (ref 80.0–100.0)
Platelets: 135 10*3/uL — ABNORMAL LOW (ref 150–400)
RBC: 2.51 MIL/uL — ABNORMAL LOW (ref 3.87–5.11)
RDW: 19.4 % — ABNORMAL HIGH (ref 11.5–15.5)
WBC: 8.4 10*3/uL (ref 4.0–10.5)
nRBC: 0 % (ref 0.0–0.2)

## 2022-04-29 LAB — BASIC METABOLIC PANEL
Anion gap: 7 (ref 5–15)
BUN: 26 mg/dL — ABNORMAL HIGH (ref 8–23)
CO2: 23 mmol/L (ref 22–32)
Calcium: 8.4 mg/dL — ABNORMAL LOW (ref 8.9–10.3)
Chloride: 107 mmol/L (ref 98–111)
Creatinine, Ser: 1.29 mg/dL — ABNORMAL HIGH (ref 0.44–1.00)
GFR, Estimated: 41 mL/min — ABNORMAL LOW (ref >60)
Glucose, Bld: 87 mg/dL (ref 70–99)
Potassium: 4.1 mmol/L (ref 3.5–5.1)
Sodium: 137 mmol/L (ref 135–145)

## 2022-04-29 LAB — VITAMIN B12: Vitamin B-12: 176 pg/mL — ABNORMAL LOW (ref 180–914)

## 2022-04-29 LAB — SURGICAL PCR SCREEN
MRSA, PCR: POSITIVE — AB
Staphylococcus aureus: POSITIVE — AB

## 2022-04-29 LAB — PREPARE RBC (CROSSMATCH)

## 2022-04-29 SURGERY — FIXATION, FRACTURE, INTERTROCHANTERIC, WITH INTRAMEDULLARY ROD
Anesthesia: General | Site: Shoulder | Laterality: Right

## 2022-04-29 MED ORDER — PHENYLEPHRINE HCL (PRESSORS) 10 MG/ML IV SOLN
INTRAVENOUS | Status: DC | PRN
  Administered 2022-04-29: 80 ug via INTRAVENOUS
  Administered 2022-04-29: 100 ug via INTRAVENOUS
  Administered 2022-04-29: 160 ug via INTRAVENOUS
  Administered 2022-04-29: 120 ug via INTRAVENOUS
  Administered 2022-04-29: 160 ug via INTRAVENOUS
  Administered 2022-04-29: 80 ug via INTRAVENOUS

## 2022-04-29 MED ORDER — ONDANSETRON HCL 4 MG PO TABS: 4.0000 mg | ORAL_TABLET | Freq: Four times a day (QID) | ORAL | Status: DC | PRN

## 2022-04-29 MED ORDER — GENTAMICIN SULFATE 40 MG/ML IJ SOLN
INTRAMUSCULAR | Status: AC
  Filled 2022-04-29: qty 2

## 2022-04-29 MED ORDER — CHLORHEXIDINE GLUCONATE 0.12 % MT SOLN
15.0000 mL | Freq: Once | OROMUCOSAL | Status: AC
  Administered 2022-04-29: 15 mL via OROMUCOSAL

## 2022-04-29 MED ORDER — ROCURONIUM BROMIDE 10 MG/ML (PF) SYRINGE
PREFILLED_SYRINGE | INTRAVENOUS | Status: AC
  Filled 2022-04-29: qty 10

## 2022-04-29 MED ORDER — ACETAMINOPHEN 500 MG PO TABS
500.0000 mg | ORAL_TABLET | Freq: Four times a day (QID) | ORAL | Status: AC
  Administered 2022-04-30 (×3): 500 mg via ORAL
  Filled 2022-04-29 (×3): qty 1

## 2022-04-29 MED ORDER — OXYCODONE HCL 5 MG PO TABS: 5.0000 mg | ORAL_TABLET | Freq: Once | ORAL | Status: DC | PRN

## 2022-04-29 MED ORDER — ASPIRIN 325 MG PO TBEC
325.0000 mg | DELAYED_RELEASE_TABLET | Freq: Every day | ORAL | Status: DC
  Administered 2022-04-30 – 2022-05-02 (×3): 325 mg via ORAL
  Filled 2022-04-29 (×3): qty 1

## 2022-04-29 MED ORDER — HYDROCODONE-ACETAMINOPHEN 5-325 MG PO TABS
1.0000 | ORAL_TABLET | ORAL | Status: DC | PRN
  Administered 2022-05-07 (×2): 1 via ORAL
  Filled 2022-04-29 (×2): qty 1

## 2022-04-29 MED ORDER — SUGAMMADEX SODIUM 200 MG/2ML IV SOLN
INTRAVENOUS | Status: DC | PRN
  Administered 2022-04-29: 200 mg via INTRAVENOUS

## 2022-04-29 MED ORDER — MENTHOL 3 MG MT LOZG: 1.0000 | LOZENGE | OROMUCOSAL | Status: DC | PRN

## 2022-04-29 MED ORDER — ONDANSETRON HCL 4 MG/2ML IJ SOLN
INTRAMUSCULAR | Status: AC
  Filled 2022-04-29: qty 2

## 2022-04-29 MED ORDER — LIDOCAINE HCL (PF) 1 % IJ SOLN
INTRAMUSCULAR | Status: DC | PRN
  Administered 2022-04-29: 55 mL

## 2022-04-29 MED ORDER — CEFAZOLIN SODIUM-DEXTROSE 2-4 GM/100ML-% IV SOLN
2.0000 g | Freq: Two times a day (BID) | INTRAVENOUS | Status: AC
  Administered 2022-04-30: 2 g via INTRAVENOUS
  Filled 2022-04-29: qty 100

## 2022-04-29 MED ORDER — ACETAMINOPHEN 10 MG/ML IV SOLN
INTRAVENOUS | Status: DC | PRN
  Administered 2022-04-29: 1000 mg via INTRAVENOUS

## 2022-04-29 MED ORDER — DEXAMETHASONE SODIUM PHOSPHATE 10 MG/ML IJ SOLN
INTRAMUSCULAR | Status: AC
  Filled 2022-04-29: qty 1

## 2022-04-29 MED ORDER — HYDROCODONE-ACETAMINOPHEN 5-325 MG PO TABS: 1.0000 | ORAL_TABLET | ORAL | 0 refills | Status: AC | PRN

## 2022-04-29 MED ORDER — ROCURONIUM BROMIDE 100 MG/10ML IV SOLN
INTRAVENOUS | Status: DC | PRN
  Administered 2022-04-29: 50 mg via INTRAVENOUS

## 2022-04-29 MED ORDER — CEFAZOLIN SODIUM-DEXTROSE 2-4 GM/100ML-% IV SOLN
2.0000 g | INTRAVENOUS | Status: AC
  Administered 2022-04-29: 2 g via INTRAVENOUS

## 2022-04-29 MED ORDER — FENTANYL CITRATE (PF) 100 MCG/2ML IJ SOLN
INTRAMUSCULAR | Status: DC | PRN
  Administered 2022-04-29: 50 ug via INTRAVENOUS
  Administered 2022-04-29: 25 ug via INTRAVENOUS

## 2022-04-29 MED ORDER — CEFAZOLIN SODIUM-DEXTROSE 2-4 GM/100ML-% IV SOLN
INTRAVENOUS | Status: AC
  Filled 2022-04-29: qty 100

## 2022-04-29 MED ORDER — CHLORHEXIDINE GLUCONATE CLOTH 2 % EX PADS
6.0000 | MEDICATED_PAD | Freq: Every day | CUTANEOUS | Status: AC
  Administered 2022-04-29 – 2022-05-03 (×3): 6 via TOPICAL

## 2022-04-29 MED ORDER — PHENYLEPHRINE 80 MCG/ML (10ML) SYRINGE FOR IV PUSH (FOR BLOOD PRESSURE SUPPORT)
PREFILLED_SYRINGE | INTRAVENOUS | Status: AC
  Filled 2022-04-29: qty 10

## 2022-04-29 MED ORDER — DOCUSATE SODIUM 100 MG PO CAPS
100.0000 mg | ORAL_CAPSULE | Freq: Two times a day (BID) | ORAL | Status: DC
  Administered 2022-04-29 – 2022-05-01 (×4): 100 mg via ORAL
  Filled 2022-04-29 (×4): qty 1

## 2022-04-29 MED ORDER — ONDANSETRON HCL 4 MG/2ML IJ SOLN
INTRAMUSCULAR | Status: DC | PRN
  Administered 2022-04-29: 4 mg via INTRAVENOUS

## 2022-04-29 MED ORDER — LACTATED RINGERS IV SOLN: INTRAVENOUS | Status: DC | PRN

## 2022-04-29 MED ORDER — ACETAMINOPHEN 10 MG/ML IV SOLN
INTRAVENOUS | Status: AC
  Filled 2022-04-29: qty 100

## 2022-04-29 MED ORDER — MORPHINE SULFATE (PF) 2 MG/ML IV SOLN: 0.5000 mg | INTRAVENOUS | Status: DC | PRN

## 2022-04-29 MED ORDER — CARVEDILOL 25 MG PO TABS
25.0000 mg | ORAL_TABLET | Freq: Two times a day (BID) | ORAL | Status: DC
  Administered 2022-04-30 – 2022-05-02 (×5): 25 mg via ORAL
  Filled 2022-04-29 (×5): qty 1

## 2022-04-29 MED ORDER — CYANOCOBALAMIN 1000 MCG/ML IJ SOLN
1000.0000 ug | Freq: Every day | INTRAMUSCULAR | Status: AC
  Administered 2022-04-30 – 2022-05-01 (×2): 1000 ug via INTRAMUSCULAR
  Filled 2022-04-29 (×3): qty 1

## 2022-04-29 MED ORDER — HYDROCODONE-ACETAMINOPHEN 7.5-325 MG PO TABS: 1.0000 | ORAL_TABLET | ORAL | Status: DC | PRN

## 2022-04-29 MED ORDER — CHLORHEXIDINE GLUCONATE 0.12 % MT SOLN
OROMUCOSAL | Status: AC
  Filled 2022-04-29: qty 15

## 2022-04-29 MED ORDER — PHENOL 1.4 % MT LIQD: 1.0000 | OROMUCOSAL | Status: DC | PRN

## 2022-04-29 MED ORDER — ONDANSETRON HCL 4 MG/2ML IJ SOLN: 4.0000 mg | Freq: Four times a day (QID) | INTRAMUSCULAR | Status: DC | PRN

## 2022-04-29 MED ORDER — LIDOCAINE HCL (CARDIAC) PF 100 MG/5ML IV SOSY
PREFILLED_SYRINGE | INTRAVENOUS | Status: DC | PRN
  Administered 2022-04-29: 40 mg via INTRAVENOUS

## 2022-04-29 MED ORDER — METOCLOPRAMIDE HCL 5 MG/ML IJ SOLN: 5.0000 mg | Freq: Three times a day (TID) | INTRAMUSCULAR | Status: DC | PRN

## 2022-04-29 MED ORDER — SODIUM CHLORIDE 0.9% IV SOLUTION: Freq: Once | INTRAVENOUS | Status: AC

## 2022-04-29 MED ORDER — OXYCODONE HCL 5 MG/5ML PO SOLN: 5.0000 mg | Freq: Once | ORAL | Status: DC | PRN

## 2022-04-29 MED ORDER — FENTANYL CITRATE (PF) 100 MCG/2ML IJ SOLN
INTRAMUSCULAR | Status: AC
  Filled 2022-04-29: qty 2

## 2022-04-29 MED ORDER — LIDOCAINE HCL (PF) 1 % IJ SOLN
INTRAMUSCULAR | Status: AC
  Filled 2022-04-29: qty 30

## 2022-04-29 MED ORDER — ACETAMINOPHEN 325 MG PO TABS
325.0000 mg | ORAL_TABLET | Freq: Four times a day (QID) | ORAL | Status: DC | PRN
  Administered 2022-05-02 – 2022-05-06 (×5): 650 mg via ORAL
  Filled 2022-04-29 (×5): qty 2

## 2022-04-29 MED ORDER — DEXAMETHASONE SODIUM PHOSPHATE 10 MG/ML IJ SOLN
INTRAMUSCULAR | Status: DC | PRN
  Administered 2022-04-29: 5 mg via INTRAVENOUS

## 2022-04-29 MED ORDER — FENTANYL CITRATE (PF) 100 MCG/2ML IJ SOLN: 25.0000 ug | INTRAMUSCULAR | Status: DC | PRN

## 2022-04-29 MED ORDER — PROPOFOL 10 MG/ML IV BOLUS
INTRAVENOUS | Status: DC | PRN
  Administered 2022-04-29: 60 mg via INTRAVENOUS

## 2022-04-29 MED ORDER — 0.9 % SODIUM CHLORIDE (POUR BTL) OPTIME
TOPICAL | Status: DC | PRN
  Administered 2022-04-29: 1000 mL

## 2022-04-29 MED ORDER — PROPOFOL 10 MG/ML IV BOLUS
INTRAVENOUS | Status: AC
  Filled 2022-04-29: qty 20

## 2022-04-29 MED ORDER — BUPIVACAINE HCL (PF) 0.5 % IJ SOLN
INTRAMUSCULAR | Status: AC
  Filled 2022-04-29: qty 30

## 2022-04-29 MED ORDER — PHENYLEPHRINE HCL (PRESSORS) 10 MG/ML IV SOLN
INTRAVENOUS | Status: AC
  Filled 2022-04-29: qty 1

## 2022-04-29 MED ORDER — METOCLOPRAMIDE HCL 5 MG PO TABS: 5.0000 mg | ORAL_TABLET | Freq: Three times a day (TID) | ORAL | Status: DC | PRN

## 2022-04-29 MED ORDER — EPINEPHRINE PF 1 MG/ML IJ SOLN
INTRAMUSCULAR | Status: AC
  Filled 2022-04-29: qty 1

## 2022-04-29 MED ORDER — PHENYLEPHRINE HCL-NACL 20-0.9 MG/250ML-% IV SOLN
INTRAVENOUS | Status: DC | PRN
  Administered 2022-04-29: 40 ug/min via INTRAVENOUS

## 2022-04-29 SURGICAL SUPPLY — 46 items
APL PRP STRL LF DISP 70% ISPRP (MISCELLANEOUS) ×6
BIT DRILL INTERTAN LAG SCREW (BIT) ×1 IMPLANT
BIT DRILL LONG 4.0 (BIT) ×1 IMPLANT
BNDG CMPR 5X6 CHSV STRCH STRL (GAUZE/BANDAGES/DRESSINGS) ×6
BNDG COHESIVE 6X5 TAN ST LF (GAUZE/BANDAGES/DRESSINGS) ×8 IMPLANT
CHLORAPREP W/TINT 26 (MISCELLANEOUS) ×6 IMPLANT
DRAPE 3/4 80X56 (DRAPES) ×5 IMPLANT
DRAPE C-ARM XRAY 36X54 (DRAPES) ×3 IMPLANT
DRAPE C-ARMOR (DRAPES) IMPLANT
DRILL BIT LONG 4.0 (BIT) ×3
DRSG AQUACEL AG ADV 3.5X 4 (GAUZE/BANDAGES/DRESSINGS) ×2 IMPLANT
ELECT REM PT RETURN 9FT ADLT (ELECTROSURGICAL) ×3
ELECTRODE REM PT RTRN 9FT ADLT (ELECTROSURGICAL) ×3 IMPLANT
GLOVE BIO SURGEON STRL SZ7.5 (GLOVE) ×6 IMPLANT
GLOVE BIOGEL PI IND STRL 8 (GLOVE) ×3 IMPLANT
GLOVE SURG UNDER POLY LF SZ7.5 (GLOVE) ×3 IMPLANT
GOWN STRL REUS W/ TWL LRG LVL3 (GOWN DISPOSABLE) ×5 IMPLANT
GOWN STRL REUS W/TWL LRG LVL3 (GOWN DISPOSABLE) ×3
GOWN STRL REUS W/TWL XL LVL4 (GOWN DISPOSABLE) ×3 IMPLANT
GUIDE PIN 3.2X343 (PIN) ×6
GUIDE PIN 3.2X343MM (PIN) ×6
HANDLE YANKAUER SUCT BULB TIP (MISCELLANEOUS) ×3 IMPLANT
KIT TURNOVER CYSTO (KITS) ×3 IMPLANT
MANIFOLD NEPTUNE II (INSTRUMENTS) ×3 IMPLANT
MAT ABSORB  FLUID 56X50 GRAY (MISCELLANEOUS) ×3
MAT ABSORB FLUID 56X50 GRAY (MISCELLANEOUS) ×3 IMPLANT
NAIL TRIGEN INTERTAN 10X18CM (Nail) ×1 IMPLANT
NDL HYPO 25X1 1.5 SAFETY (NEEDLE) ×2 IMPLANT
NEEDLE HYPO 25X1 1.5 SAFETY (NEEDLE) ×3 IMPLANT
NS IRRIG 1000ML POUR BTL (IV SOLUTION) ×3 IMPLANT
PACK HIP COMPR (MISCELLANEOUS) ×3 IMPLANT
PIN GUIDE 3.2X343MM (PIN) ×2 IMPLANT
SCREW LAG COMPR KIT 85/80 (Screw) ×1 IMPLANT
SCREW TRIGEN LOW PROF 5.0X32.5 (Screw) ×1 IMPLANT
SPONGE T-LAP 18X18 ~~LOC~~+RFID (SPONGE) ×3 IMPLANT
STAPLER SKIN PROX 35W (STAPLE) ×3 IMPLANT
SUT ETHIBOND 0 MO6 C/R (SUTURE) ×3 IMPLANT
SUT VIC AB 0 CT1 36 (SUTURE) ×5 IMPLANT
SUT VIC AB 2-0 CT1 (SUTURE) ×2 IMPLANT
SUT VIC AB 2-0 CT1 27 (SUTURE) ×3
SUT VIC AB 2-0 CT1 TAPERPNT 27 (SUTURE) ×9 IMPLANT
SYR 10ML LL (SYRINGE) ×3 IMPLANT
SYR 30ML LL (SYRINGE) ×3 IMPLANT
TRAP FLUID SMOKE EVACUATOR (MISCELLANEOUS) ×3 IMPLANT
TRAY FOLEY SLVR 16FR LF STAT (SET/KITS/TRAYS/PACK) ×3 IMPLANT
WATER STERILE IRR 500ML POUR (IV SOLUTION) ×3 IMPLANT

## 2022-04-29 NOTE — Progress Notes (Signed)
Pt's 02 sats decreased to 88% on RA at [facility].  Pt placed on 2L of 02 per order to maintain 02 sats > 92%.  Also, edema (similar to an IV infiltrate) noted to left forearm.  Site assessed,  IV flushed with a good blood return; the swelling is not related to the IV.

## 2022-04-29 NOTE — Discharge Instructions (Signed)
Orthopedic discharge instructions: Patient has had a right hip IM nail.  She also has a right proximal humerus fracture treated in a sling. Patient should remain weightbearing weightbearing on the right leg until follow-up.  Any with sling use for the right arm and is okay for pendulums, elbow wrist and hand motion. Dressing changes as needed DVT prophylaxis includes aspirin 325 mg daily Follow up with Dr. Minda Meo PA, Murilo Zanette PA-C at [employer], [place] office in 10-14 days for wound check, staple removal and x-ray.

## 2022-04-29 NOTE — Op Note (Signed)
DATE OF SURGERY:  04/29/2022  TIME: 5:19 PM  PATIENT NAME:  Bianca Horton  AGE: 87 y.o.  PRE-OPERATIVE DIAGNOSIS:  Right hip fracture, and right humerus fracture  POST-OPERATIVE DIAGNOSIS:  SAME  PROCEDURE: Right INTRAMEDULLARY (IM) NAIL INTERTROCHANTERIC, CLOSED REDUCTION SHOULDER  SURGEON:  Renee Harder  EBL:  [id number] cc  COMPLICATIONS: None  OPERATIVE IMPLANTS: [employer] Intertan femoral nail 10 mm x 180 mm  PREOPERATIVE INDICATIONS:  Bianca Horton is a 87 y.o. year old who fell and suffered a hip fracture and right proximal humerus fracture. She was brought into the ER and then admitted and optimized and then elected for surgical intervention.    The risks benefits and alternatives were discussed with the patient including but not limited to the risks of nonoperative treatment, versus surgical intervention including infection, bleeding, nerve injury, malunion, nonunion, hardware prominence, hardware failure, need for hardware removal, blood clots, cardiopulmonary complications, morbidity, mortality, among others, and they were willing to proceed.    OPERATIVE PROCEDURE:  The patient was brought to the operating room and placed in the supine position.  General anesthesia was administered, with a foley. She was placed on the fracture table.  Closed reduction was performed under C-arm guidance. The length of the femur was also measured using fluoroscopy. Time out was then performed after sterile prep and drape. She received preoperative antibiotics.  Incision was made proximal to the greater trochanter. A guidewire was placed in the appropriate position. Confirmation was made on AP and lateral views. The above-named nail was opened. I opened the proximal femur with a reamer. I then placed the nail by hand easily down. I did not need to ream the femur.  Once the nail was completely seated, I placed a guidepin into the femoral head into the center center position through a second  incision.  I measured the length, and then reamed the lateral cortex and up into the head. I then placed the two interlocking lag screws. Slight compression was applied. Anatomic fixation achieved. Bone quality was poor.  I then secured the proximal interlocking screws.  I then removed the instruments, and took final C-arm pictures AP and lateral the entire length of the leg. Anatomic reconstruction was achieved, and the wounds were irrigated copiously and closed with Vicryl  followed by staples and dry sterile dressing. Sponge and needle count were correct.   Next, C-arm fluoroscopy was utilized overlying the right shoulder.  Traction countertraction was applied to help pull the humeral shaft out to length, bring it more posteriorly, and disimpact the fracture site.  Patient was placed into a shoulder immobilizer once there was appropriate reduction.  The patient was awakened and returned to PACU in stable and satisfactory condition. There no complications and the patient tolerated the procedure well.   POSTOPERATIVE PLAN: She will be weightbearing as tolerated on the right lower extremity.  She will be in a sling/shoulder immobilizer and allowed shoulder pendulums, elbow wrist and hand range of motion for the right upper extremity. Ok to start DVT ppx POD#1 -aspirin 325 mg daily Dressing change by nursing staff as needed to keep dressing clean and dry Outpatient f/u in clinic in 2 weeks for staple removal and xrays of right hip and right shoulder  Renee Harder

## 2022-04-29 NOTE — Assessment & Plan Note (Signed)
Continue Synthroid °

## 2022-04-29 NOTE — Anesthesia Postprocedure Evaluation (Addendum)
Anesthesia Post Note  Patient: DELCIA DANKS  Procedure(s) Performed: INTRAMEDULLARY (IM) NAIL INTERTROCHANTERIC (Right: Hip) CLOSED REDUCTION SHOULDER (Right: Shoulder)  Patient location during evaluation: PACU Anesthesia Type: General Level of consciousness: awake and alert Pain management: pain level controlled Vital Signs Assessment: post-procedure vital signs reviewed and stable Respiratory status: spontaneous breathing, nonlabored ventilation, respiratory function stable and patient connected to nasal cannula oxygen Cardiovascular status: blood pressure returned to baseline and stable Postop Assessment: no apparent nausea or vomiting Anesthetic complications: no Comments: Patient's arm was edematous and the blood pressure machine was having a hard time getting a reading. Performed a manual blood pressure reading on her left arm and her blood pressure was 90/60. Patient's pre op blood pressure at 900 was 103/72. Patient stated that she felt fine and "bored" in the PACU. Discussed the case with the internist who stated she felt it was fine the patient go to the floor. Discussed the case with the PACU nurses.   No notable events documented.   Last Vitals:  Vitals:   04/29/22 1740 04/29/22 1745  BP: 95/60 (!) 96/58  Pulse: 88 87  Resp: 12 12  Temp:    SpO2: 100% 100%    Last Pain:  Vitals:   04/29/22 1745  TempSrc:   PainSc: 0-No pain                 Dimas Millin

## 2022-04-29 NOTE — Addendum Note (Signed)
Addendum  created 04/29/22 1852 by Dimas Millin, MD   Clinical Note Signed

## 2022-04-29 NOTE — Assessment & Plan Note (Signed)
CT also showed nonacute bilateral pubic rami fractures, nondisplaced and subtle . -Continue with pain management

## 2022-04-29 NOTE — Assessment & Plan Note (Signed)
Further decrease in hemoglobin with fall and resulted multiple fractures.  Hemoglobin at 7.7 this morning after getting 1 unit of PRBC. Anemia panel consistent with anemia of chronic disease and B12 deficiency. -Ordered second unit as patient is going to the OR. -B12 supplement

## 2022-04-29 NOTE — Assessment & Plan Note (Signed)
Blood pressure started trending up.  Initial home antihypertensives were held due to softer blood pressure on admission. -Restarting home Coreg -Will add low-dose enalapril as needed

## 2022-04-29 NOTE — Hospital Course (Addendum)
Taken from H&P.  Bianca Horton is a 87 y.o. female with medical history significant of HTN, HLD, depression, CKD-3b, anemia, AAA, HOH, early stage of dementia, hypothyroidism, who presents with fall, pain right shoulder and right hip.   Patient has ambulatory dysfunction at baseline, and is bed bound. This early morning, pt forgot that she is not able to walk safely, and this morning got out of bed and tried to walk, falling over and landing on her right side. No LOC. She injured her right shoulder and right hip, causing pain which is constant, sharp, severe, nonradiating, aggravated by movement.   Data reviewed independently and ED Course: pt was found to have WBC 11.2, hemoglobin 7.0 (7.8 on 01/28/2022), stable renal function.  Chest x-ray negative.  CT of C-spine negative. CT of head is negative for acute intracranial abnormalities, but showed mild right posterior parietal scalp hematoma. X-ray showed right shoulder with displaced right proximal humeral fracture and x-ray of right hip with comminuted right intertrochanteric fracture.  Orthopedic was consulted and patient will be going to the OR for nailing of right hip and ORIF of right shoulder.  2/26: Vital stable.  Hemoglobin at 7.7, it showed improvement to 9.2 after getting 1 unit of PRBC.  Ordered 1 more unit as she is going for surgery today. Rest of the labs seems stable with resolution of leukocytosis.  Patient is going to the OR today.  2/27: Vitals stable with low normal blood pressure, s/p closed reduction left shoulder and ORIF of right hip.  Hemoglobin decreased to 6.9, anemia panel consistent with anemia of chronic disease and B12 deficiency.  Ordered 1 unit of PRBC and she will continue on supplements.  She will be weightbearing as tolerated on right lower extremity and right upper extremity will remain in sling.  AKI today with creatinine bumped to 1.50.  Getting some gentle IV fluid.  PICC line was placed after multiple failed  attempts.  Need to be removed before discharge.  PT is recommending SNF, patient is a long-term resident at [facility], hopefully can go back there with rehab in 1 to 2 days

## 2022-04-29 NOTE — Assessment & Plan Note (Signed)
PT/OT evaluation after the surgery

## 2022-04-29 NOTE — Consult Note (Addendum)
ORTHOPAEDIC CONSULTATION  REQUESTING PHYSICIAN: Lorella Nimrod, MD  Chief Complaint: Right hip and right shoulder pain  HPI: Bianca Horton is a 87 y.o. female who complains of right hip and shoulder pain after mechanical fall.  History is somewhat limited due to patient's hearing loss.  There is no family present at bedside.  Patient with the presence of a significantly displaced right proximal humerus fracture as well as a comminuted right intertrochanteric hip fracture.  Orthopedics was consulted.  Past medical history notable for CKD, aortic aneurysm.  Past Medical History:  Diagnosis Date   High cholesterol    Hypertension    Thyroid disease    Past Surgical History:  Procedure Laterality Date   GALLBLADDER SURGERY     Social History   Socioeconomic History   Marital status: Single    Spouse name: Not on file   Number of children: Not on file   Years of education: Not on file   Highest education level: Not on file  Occupational History   Not on file  Tobacco Use   Smoking status: Light Smoker   Smokeless tobacco: Never   Tobacco comments:    process of quitting   Substance and Sexual Activity   Alcohol use: Never   Drug use: Never   Sexual activity: Not on file  Other Topics Concern   Not on file  Social History Narrative   Not on file   Social Determinants of Health   Financial Resource Strain: Not on file  Food Insecurity: No Food Insecurity (04/28/2022)   Hunger Vital Sign    Worried About Running Out of Food in the Last Year: Never true    Ran Out of Food in the Last Year: Never true  Transportation Needs: No Transportation Needs (04/28/2022)   PRAPARE - Hydrologist (Medical): No    Lack of Transportation (Non-Medical): No  Physical Activity: Not on file  Stress: Not on file  Social Connections: Not on file   Family History  Problem Relation Age of Onset   Heart disease Mother    Heart disease Father    Lung cancer  Sister    Allergies  Allergen Reactions   Clopidogrel     Other reaction(s): brusing skin   Sertraline Hcl Nausea And Vomiting    Other reaction(s): Nausea, Vomiting, Diarrhea, sleepwalking, Nausea, Vomiting, Diarrhea, sleepwalking   Prior to Admission medications   Medication Sig Start Date End Date Taking? Authorizing Provider  acetaminophen (TYLENOL) 500 MG tablet Take 1,000 mg by mouth 3 (three) times daily as needed for mild pain.   Yes [provider]  aspirin 81 MG chewable tablet Chew 81 mg by mouth daily.   Yes [provider]  buPROPion (WELLBUTRIN XL) 150 MG 24 hr tablet Take 150 mg by mouth daily. 11/27/15  Yes [provider]  calcium carbonate (TUMS - DOSED IN MG ELEMENTAL CALCIUM) 500 MG chewable tablet Chew 1 tablet by mouth 2 (two) times daily as needed for indigestion or heartburn.   Yes [provider]  carvedilol (COREG) 25 MG tablet Take 25 mg by mouth 2 (two) times daily with a meal. 07/22/13  Yes [provider]  cilostazol (PLETAL) 100 MG tablet Take 100 mg by mouth 2 (two) times daily.   Yes [provider]  enalapril (VASOTEC) 2.5 MG tablet Take 2.5 mg by mouth daily. 11/12/21  Yes [provider]  escitalopram (LEXAPRO) 10 MG tablet Take 10 mg by  mouth daily. 11/19/21  Yes [provider]  folic acid (FOLVITE) 1 MG tablet Take 1 mg by mouth daily.   Yes [provider]  levothyroxine (SYNTHROID) 125 MCG tablet Take 125 mcg by mouth every morning. 01/24/21  Yes [provider]  OLANZapine (ZYPREXA) 2.5 MG tablet Take 2.5 mg by mouth at bedtime. 04/20/22  Yes [provider]  oxyCODONE-acetaminophen (PERCOCET/ROXICET) 5-325 MG tablet Take 1 tablet by mouth 4 (four) times daily as needed for moderate pain. 01/28/22  Yes Shelly Coss, MD  senna-docusate (SENOKOT-S) 8.6-50 MG tablet Take 1 tablet by mouth at bedtime as needed for mild constipation. 01/28/22  Yes Shelly Coss, MD  simvastatin (ZOCOR) 40 MG tablet Take 40 mg by mouth at bedtime. 07/22/13  Yes [provider]  chlorhexidine (PERIDEX) 0.12 % solution Use as directed 15 mLs in the mouth or throat 2 (two) times daily. Patient not taking: Reported on 04/28/2022 01/01/22   [provider]  Menthol, Topical Analgesic, (ICY HOT ADVANCED RELIEF EX) Apply 1 Application topically as needed.    [provider]  polyethylene glycol (MIRALAX) 17 g packet Take 17 g by mouth daily. 01/28/22   Shelly Coss, MD  risperiDONE (RISPERDAL) 0.25 MG tablet Take 0.25 mg by mouth 2 (two) times daily as needed. Patient not taking: Reported on 04/28/2022 10/26/21   [provider]   CT Shoulder Right Wo Contrast  Result Date: 04/28/2022 CLINICAL DATA:  Shoulder trauma, fracture of humerus EXAM: CT OF THE UPPER RIGHT EXTREMITY WITHOUT CONTRAST TECHNIQUE: Multidetector CT imaging of the upper right extremity was performed according to the standard protocol. RADIATION DOSE REDUCTION: This exam was performed according to the departmental dose-optimization program which includes automated exposure control, adjustment of the mA and/or kV according to patient size and/or use of iterative reconstruction technique. COMPARISON:  X-ray 04/28/2022 FINDINGS: Bones/Joint/Cartilage Acute comminuted fracture of the right humeral head and neck. Transversely oriented fracture component through the surgical neck is significantly displaced anteromedially relative to the humeral head. Multiple adjacent small comminuted fracture fragments. Nondisplaced fracture involves the greater tuberosity of the proximal humerus. No fracture involvement of the humeral head articular surface. Glenohumeral joint alignment is maintained without dislocation. Remaining osseous structures are intact. No additional fractures. AC joint within normal limits. Ligaments Suboptimally assessed by CT. Muscles and Tendons Preserved rotator cuff  muscle bulk. Grossly intact tendons, not well evaluated by CT. Soft tissues Soft tissue swelling and ill-defined hematoma at the fracture site. No soft tissue air to suggest open fracture. No right axillary lymphadenopathy. Atherosclerotic vascular calcifications. Emphysematous changes within the lung fields with scarring or atelectasis in the right upper lobe. There is a small to moderate sized layering right-sided pleural effusion. IMPRESSION: 1. Acute comminuted fracture of the right humeral head and neck with significant displacement, as described above. 2. Small to moderate sized layering right-sided pleural effusion. Electronically Signed   By: Davina Poke D.O.   On: 04/28/2022 09:37   CT Cervical Spine Wo Contrast  Result Date: 04/28/2022 CLINICAL DATA:  Neck trauma (Age >= 65y) EXAM: CT CERVICAL SPINE WITHOUT CONTRAST TECHNIQUE: Multidetector CT imaging of the cervical spine was performed without intravenous contrast. Multiplanar CT image reconstructions were also generated. RADIATION DOSE REDUCTION: This exam was performed according to the departmental dose-optimization program which includes automated exposure control, adjustment of the mA and/or kV according to patient size and/or use of iterative reconstruction technique. COMPARISON:  01/23/2022 FINDINGS: Alignment: Facet joints are aligned without dislocation or traumatic  listhesis. Dens and lateral masses are aligned. Skull base and vertebrae: No acute fracture. No primary bone lesion or focal pathologic process. Soft tissues and spinal canal: No prevertebral fluid or swelling. No visible canal hematoma. Disc levels: Moderate-advanced multilevel degenerative disc disease, similar to prior. Upper chest: Emphysematous changes within the included lung apices. Other: Bilateral carotid atherosclerosis. IMPRESSION: No acute fracture or traumatic listhesis of the cervical spine. Electronically Signed   By: Davina Poke D.O.   On: 04/28/2022 09:30    CT Head Wo Contrast  Result Date: 04/28/2022 CLINICAL DATA:  Fall while getting out of bed this morning. Blunt head trauma. EXAM: CT HEAD WITHOUT CONTRAST TECHNIQUE: Contiguous axial images were obtained from the base of the skull through the vertex without intravenous contrast. RADIATION DOSE REDUCTION: This exam was performed according to the departmental dose-optimization program which includes automated exposure control, adjustment of the mA and/or kV according to patient size and/or use of iterative reconstruction technique. COMPARISON:  01/23/2022 FINDINGS: Brain: No evidence of intracranial hemorrhage, acute infarction, hydrocephalus, extra-axial collection, or mass lesion/mass effect. Mild diffuse cerebral atrophy and chronic small vessel disease are unchanged in appearance. Vascular:  No hyperdense vessel or other acute findings. Skull: No evidence of fracture or other significant bone abnormality. Mild right posterior parietal scalp hematoma is noted. Sinuses/Orbits: Near-complete opacification of both maxillary sinuses is seen. This area was not visualized on previous head CT. Other: None. IMPRESSION: Mild right posterior parietal scalp hematoma. No evidence of skull fracture. No acute intracranial abnormality. Stable mild cerebral atrophy and chronic small vessel disease. Bilateral maxillary sinusitis. Electronically Signed   By: Marlaine Hind M.D.   On: 04/28/2022 09:27   DG Shoulder Right  Result Date: 04/28/2022 CLINICAL DATA:  RIGHT humerus fracture, surgical planning. EXAM: RIGHT SHOULDER - 2+ VIEW COMPARISON:  None Available. FINDINGS: Significantly displaced transverse fracture of the RIGHT humeral neck, with anterior displacement of the distal fracture fragment and overriding of the main fracture fragments. RIGHT humeral head remains grossly well positioned relative to the glenoid fossa overlying acromioclavicular joint space is normally aligned. IMPRESSION: Significantly displaced  transverse fracture of the RIGHT humeral neck, with anterior displacement of the distal fracture fragment and complete overriding of the main fracture fragments. Electronically Signed   By: Franki Cabot M.D.   On: 04/28/2022 08:58   DG Chest 1 View  Result Date: 04/28/2022 CLINICAL DATA:  87 year old female status post fall with right hip pain. EXAM: CHEST  1 VIEW COMPARISON:  Right humerus series today.  Portable chest 12/06/2021. FINDINGS: Acute displaced and over riding proximal right humerus fracture, see comparison. The proximal left humerus also appears to be abnormal, new since October although with evidence of regional periosteal new bone formation. Lung volumes and mediastinal contours remain within normal limits. Extensive Calcified aortic atherosclerosis. Visualized tracheal air column is within normal limits. Allowing for portable technique the lungs are clear. No pneumothorax or pleural effusion. Cholecystectomy clips. No rib fracture identified. Negative visible bowel gas. IMPRESSION: 1. No acute cardiopulmonary abnormality. 2. Acute displaced and over riding proximal right humerus fracture, see comparison. 3. Proximal left humerus also appears to be abnormal, new since October but with periosteal bone formation suggesting a nonacute fracture. 4.  Aortic Atherosclerosis (ICD10-I70.0). Electronically Signed   By: Genevie Ann M.D.   On: 04/28/2022 08:18   DG Humerus Right  Result Date: 04/28/2022 CLINICAL DATA:  87 year old female status post fall with right hip pain. EXAM: RIGHT HUMERUS - 2+ VIEW COMPARISON:  Portable chest x-ray 12/06/2021. FINDINGS: Transverse fracture of the proximal right humerus appears to be through the neck and there is greater than 1 full shaft width anterior displacement and over-riding the entire with of the humeral head, at least 5 cm. The distal humerus fragment is nearly impacted on the coracoid. The right humeral head appears to remain aligned with the glenoid. The  more distal right humerus and right elbow appear grossly intact. Visible right ribs appear to remain intact. Stable right lung aeration. Extensive right axillary calcified atherosclerosis. IMPRESSION: Acute transverse fracture of the proximal right humerus neck, with complete anterior displacement and over riding of the humeral head such that the distal fracture fragment is nearly impacted on the coracoid. Electronically Signed   By: Genevie Ann M.D.   On: 04/28/2022 08:15   DG Hip Unilat W or Wo Pelvis 2-3 Views Right  Result Date: 04/28/2022 CLINICAL DATA:  87 year old female status post fall with right hip pain. EXAM: DG HIP (WITH OR WITHOUT PELVIS) 2-3V RIGHT COMPARISON:  Pelvis radiograph 12/06/2021. Pelvis CT 12/06/2021. FINDINGS: Highly comminuted right femur intertrochanteric fracture with varus impaction. Right femoral head appears to remain normally located. Lesser and greater trochanter butterfly fragments. There also appear to be bilateral pubic rami fractures. And although these were not apparent on the prior radiograph, nondisplaced ramus fractures were demonstrated on the CT at that time. Left femoral head remains normally located. Grossly intact proximal left femur. Pelvis elsewhere appears intact. Extensive aortoiliac and femoral calcified atherosclerosis. Negative visible bowel gas. IMPRESSION: 1. Acute highly comminuted right femur intertrochanteric fracture with varus impaction. 2. Nonacute bilateral pubic rami fractures, nondisplaced and subtle on imaging in October. 3. Extensive calcified atherosclerosis. Electronically Signed   By: Genevie Ann M.D.   On: 04/28/2022 08:13    Positive ROS: All other systems have been reviewed and were otherwise negative with the exception of those mentioned in the HPI and as above.  Physical Exam: General: Alert, no acute distress Cardiovascular: No pedal edema Respiratory: No cyanosis, no use of accessory musculature GI: No organomegaly, abdomen is soft and  non-tender Skin: No lesions in the area of chief complaint Neurologic: Sensation intact distally Psychiatric: Patient is competent for consent with normal mood and affect Lymphatic: No axillary or cervical lymphadenopathy  MUSCULOSKELETAL:  Right upper extremity in sling: Range of motion of shoulder deferred, able to wiggle fingers appropriately.   Tenderness about the right hip. Compartments soft. Good cap refill. Motor and sensory intact distally.  Assessment: 87 year old female admitted with a comminuted right intertrochanteric hip fracture and significantly displaced right proximal humerus fracture  Plan: I had a long discussion with the patient regarding her x-rays and treatment options both operative and nonoperative, risk and benefits and expected outcomes.  She has baseline anemia and received 1 unit PRBCs yesterday.  Plan will be for right hip IM nail and right proximal humerus ORIF.  Please keep NPO.    Renee Harder, MD    04/29/2022 7:41 AM   I spoke to the patient's niece, who is her medical power of attorney at length about her fractures as well as surgical and nonsurgical treatment options.  Of note, patient sustained a comminuted, displaced left proximal humerus fracture as well as multiple pubic rami fractures in November.  She currently resides at [facility] and is not very functional with respect to weightbearing activities.  We discussed surgical treatment of the right intertrochanteric hip fracture to help allow the patient with activities of daily living  and transferring in and out of the bed and/or wheelchair.  We also discussed options for the right proximal humerus fracture including both operative and nonoperative treatment.  After discussion, joint decision was made to proceed with right hip IM nail and closed reduction and sling application for the right proximal humerus.  Informed consent was provided.  Renee Harder 3:30 PM

## 2022-04-29 NOTE — Assessment & Plan Note (Signed)
Seems stable around baseline. -Continue to monitor

## 2022-04-29 NOTE — Anesthesia Procedure Notes (Signed)
Procedure Name: Intubation Date/Time: 04/29/2022 4:02 PM  Performed by: Cammie Sickle, CRNAPre-anesthesia Checklist: Patient identified, Patient being monitored, Timeout performed, Emergency Drugs available and Suction available Patient Re-evaluated:Patient Re-evaluated prior to induction Oxygen Delivery Method: Circle system utilized Preoxygenation: Pre-oxygenation with 100% oxygen Induction Type: IV induction Ventilation: Mask ventilation without difficulty Laryngoscope Size: 3 and McGraph Grade View: Grade I Tube type: Oral Tube size: 7.0 mm Number of attempts: 1 Airway Equipment and Method: Stylet Placement Confirmation: ETT inserted through vocal cords under direct vision, positive ETCO2 and breath sounds checked- equal and bilateral Secured at: 21 cm Tube secured with: Tape Dental Injury: Teeth and Oropharynx as per pre-operative assessment  Comments: Smooth atraumatic intubation, no complications noted

## 2022-04-29 NOTE — Anesthesia Preprocedure Evaluation (Addendum)
Anesthesia Evaluation  Patient identified by MRN, date of birth, ID band Patient awake and Patient confused    Reviewed: Allergy & Precautions, NPO status , Patient's Chart, lab work & pertinent test results  History of Anesthesia Complications Negative for: history of anesthetic complications  Airway Mallampati: III  TM Distance: <3 FB Neck ROM: full    Dental  (+) Upper Dentures, Lower Dentures   Pulmonary Current Smoker   Pulmonary exam normal        Cardiovascular hypertension, (-) Past MI Normal cardiovascular exam     Neuro/Psych       Dementia negative neurological ROS     GI/Hepatic negative GI ROS, Neg liver ROS,,,  Endo/Other  Hypothyroidism    Renal/GU Renal disease     Musculoskeletal   Abdominal   Peds  Hematology negative hematology ROS (+)   Anesthesia Other Findings Past Medical History: No date: High cholesterol No date: Hypertension No date: Thyroid disease  Past Surgical History: No date: GALLBLADDER SURGERY  BMI    Body Mass Index: 21.19 kg/m      Reproductive/Obstetrics negative OB ROS                             Anesthesia Physical Anesthesia Plan  ASA: 3  Anesthesia Plan: General ETT   Post-op Pain Management:    Induction: Intravenous  PONV Risk Score and Plan: Ondansetron, Dexamethasone, Midazolam and Treatment may vary due to age or medical condition  Airway Management Planned: Oral ETT  Additional Equipment:   Intra-op Plan:   Post-operative Plan: Extubation in OR  Informed Consent: I have reviewed the patients History and Physical, chart, labs and discussed the procedure including the risks, benefits and alternatives for the proposed anesthesia with the patient or authorized representative who has indicated his/her understanding and acceptance.   Patient has DNR.  Discussed DNR with power of attorney and Suspend DNR.   Dental  Advisory Given  Plan Discussed with: Anesthesiologist, CRNA and Surgeon  Anesthesia Plan Comments: (History and phone consent from her [place] [phone].  Ms Deon Pilling reports that Ms Hosseini had documented that she wished for no strong resuscitative measures such as CPR or defibrillation so we plan to act accordingly.  That we can intubate her for the procedure but if the patiens heart were to stop for go into a fatal arhythmia that we should let her expire.  Niece consented for risks of anesthesia including but not limited to:  - adverse reactions to medications - damage to eyes, teeth, lips or other oral mucosa - nerve damage due to positioning  - sore throat or hoarseness - Damage to heart, brain, nerves, lungs, other parts of body or loss of life  Niece voiced understanding.)       Anesthesia Quick Evaluation

## 2022-04-29 NOTE — Assessment & Plan Note (Signed)
Secondary to mechanical fall.  Acute comminuted right femoral intertrochanteric fracture with varus impaction.  Orthopedic was consulted and she will be going to the OR later today. -Continue with pain management -Follow-up postoperative recommendations

## 2022-04-29 NOTE — Assessment & Plan Note (Signed)
Continue Zocor 

## 2022-04-29 NOTE — Assessment & Plan Note (Signed)
Secondary to the same fall which resulted in right humeral and right intertrochanteric femoral fracture.  Orthopedic is on board and patient will be going to the OR for ORIF for both sites. -Continue with pain management

## 2022-04-29 NOTE — Progress Notes (Signed)
Progress Note   Patient: Bianca Horton [id number] DOB: 05-17-1935 DOA: 04/28/2022     1 DOS: the patient was seen and examined on 04/29/2022   Brief hospital course: Taken from H&P.  Bianca Horton is a 87 y.o. female with medical history significant of HTN, HLD, depression, CKD-3b, anemia, AAA, HOH, early stage of dementia, hypothyroidism, who presents with fall, pain right shoulder and right hip.   Patient has ambulatory dysfunction at baseline, and is bed bound. This early morning, pt forgot that she is not able to walk safely, and this morning got out of bed and tried to walk, falling over and landing on her right side. No LOC. She injured her right shoulder and right hip, causing pain which is constant, sharp, severe, nonradiating, aggravated by movement.   Data reviewed independently and ED Course: pt was found to have WBC 11.2, hemoglobin 7.0 (7.8 on 01/28/2022), stable renal function.  Chest x-ray negative.  CT of C-spine negative. CT of head is negative for acute intracranial abnormalities, but showed mild right posterior parietal scalp hematoma. X-ray showed right shoulder with displaced right proximal humeral fracture and x-ray of right hip with comminuted right intertrochanteric fracture.  Orthopedic was consulted and patient will be going to the OR for nailing of right hip and ORIF of right shoulder.  2/26: Vital stable.  Hemoglobin at 7.7, it showed improvement to 9.2 after getting 1 unit of PRBC.  Ordered 1 more unit as she is going for surgery today. Rest of the labs seems stable with resolution of leukocytosis.  Patient is going to the OR today.      Assessment and Plan: * Closed right hip fracture ([place]) Secondary to mechanical fall.  Acute comminuted right femoral intertrochanteric fracture with varus impaction.  Orthopedic was consulted and she will be going to the OR later today. -Continue with pain management -Follow-up postoperative recommendations  Closed  fracture of right proximal humerus Secondary to the same fall which resulted in right humeral and right intertrochanteric femoral fracture.  Orthopedic is on board and patient will be going to the OR for ORIF for both sites. -Continue with pain management  Bilateral pubic rami fractures (HCC) CT also showed nonacute bilateral pubic rami fractures, nondisplaced and subtle . -Continue with pain management  Essential hypertension Blood pressure started trending up.  Initial home antihypertensives were held due to softer blood pressure on admission. -Restarting home Coreg -Will add low-dose enalapril as needed  Stage 3b chronic kidney disease (CKD) (HCC) Seems stable around baseline. -Continue to monitor  Fall at home, initial encounter PT/OT evaluation after the surgery  Hyperlipidemia -Continue Zocor  Hypothyroidism -Continue Synthroid  Macrocytic anemia Further decrease in hemoglobin with fall and resulted multiple fractures.  Hemoglobin at 7.7 this morning after getting 1 unit of PRBC. Anemia panel consistent with anemia of chronic disease and B12 deficiency. -Ordered second unit as patient is going to the OR. -B12 supplement  Tobacco abuse -Nicotine patch  Depression with anxiety -Continue home medication: Olanzapine, Lexapro, Wellbutrin         Subjective: Patient was seen and examined today.  Very hard of hearing lady and denies any significant pain.  Physical Exam: Vitals:   04/29/22 1004 04/29/22 1025 04/29/22 1045 04/29/22 1307  BP:  (!) 137/92 (!) 144/88 (!) 152/73  Pulse:  (!) 106 99 86  Resp:  '18 18 16  '$ Temp:  98.4 F (36.9 C) 98.5 F (36.9 C) 97.9 F (36.6 C)  TempSrc:  Oral  Oral Oral  SpO2: 100% 100% 97%   Weight:      Height:       General.  Frail and hard of hearing elderly lady, in no acute distress. Pulmonary.  Lungs clear bilaterally, normal respiratory effort. CV.  Regular rate and rhythm, no JVD, rub or murmur. Abdomen.  Soft,  nontender, nondistended, BS positive. CNS.  Alert and oriented .  No focal neurologic deficit. Extremities.  RUE in sling with some edema of arm,RLE with external rotation. Psychiatry.  Judgment and insight appears impaired  Data Reviewed: Prior data reviewed  Family Communication:   Disposition: Status is: Inpatient Remains inpatient appropriate because: Severity of illness  Planned Discharge Destination: Skilled nursing facility  Time spent: 45 minutes  .sad  Author: Lorella Nimrod, MD 04/29/2022 2:34 PM  For on call review [email or web address].

## 2022-04-29 NOTE — TOC Progression Note (Signed)
Transition of Care [facility]) - Progression Note    Patient Details  Name: Bianca Horton MRN: [id number] Date of Birth: 1935/04/29  Transition of Care [facility]) CM/SW [place], RN Phone Number: 04/29/2022, 2:13 PM  Clinical Narrative:     Plan will be for right hip IM nail and right proximal humerus ORIF , TOC to follow and assist with DC planning and needs       Expected Discharge Plan and Services                                               Social Determinants of Health (SDOH) Interventions SDOH Screenings   Food Insecurity: No Food Insecurity (04/28/2022)  Housing: Low Risk  (04/28/2022)  Transportation Needs: No Transportation Needs (04/28/2022)  Utilities: Not At Risk (04/28/2022)  Tobacco Use: High Risk (04/28/2022)    Readmission Risk Interventions     No data to display

## 2022-04-29 NOTE — Transfer of Care (Signed)
Immediate Anesthesia Transfer of Care Note  Patient: Bianca Horton  Procedure(s) Performed: INTRAMEDULLARY (IM) NAIL INTERTROCHANTERIC (Right: Hip) CLOSED REDUCTION SHOULDER (Right: Shoulder)  Patient Location: PACU  Anesthesia Type:General  Level of Consciousness: drowsy  Airway & Oxygen Therapy: Patient Spontanous Breathing and Patient connected to face mask oxygen  Post-op Assessment: Report given to RN, Post -op Vital signs reviewed and stable, and Patient moving all extremities  Post vital signs: Reviewed and stable  Last Vitals:  Vitals Value Taken Time  BP 98/48 04/29/22 1724  Temp 36.1 C 04/29/22 1724  Pulse 76 04/29/22 1727  Resp 10 04/29/22 1727  SpO2 100 % 04/29/22 1727  Vitals shown include unvalidated device data.  Last Pain:  Vitals:   04/29/22 1439  TempSrc: Oral  PainSc: 0-No pain         Complications: No notable events documented.

## 2022-04-29 NOTE — Assessment & Plan Note (Signed)
-  Continue home medication: Olanzapine, Lexapro, Wellbutrin

## 2022-04-29 NOTE — Assessment & Plan Note (Signed)
-  Nicotine patch 

## 2022-04-30 ENCOUNTER — Encounter: Payer: Self-pay | Admitting: Orthopaedic Surgery

## 2022-04-30 ENCOUNTER — Inpatient Hospital Stay: Payer: Self-pay

## 2022-04-30 DIAGNOSIS — S72001A Fracture of unspecified part of neck of right femur, initial encounter for closed fracture: Secondary | ICD-10-CM | POA: Diagnosis not present

## 2022-04-30 LAB — BASIC METABOLIC PANEL
Anion gap: 8 (ref 5–15)
BUN: 33 mg/dL — ABNORMAL HIGH (ref 8–23)
CO2: 19 mmol/L — ABNORMAL LOW (ref 22–32)
Calcium: 8.4 mg/dL — ABNORMAL LOW (ref 8.9–10.3)
Chloride: 106 mmol/L (ref 98–111)
Creatinine, Ser: 1.5 mg/dL — ABNORMAL HIGH (ref 0.44–1.00)
GFR, Estimated: 34 mL/min — ABNORMAL LOW (ref >60)
Glucose, Bld: 123 mg/dL — ABNORMAL HIGH (ref 70–99)
Potassium: 4.3 mmol/L (ref 3.5–5.1)
Sodium: 133 mmol/L — ABNORMAL LOW (ref 135–145)

## 2022-04-30 LAB — CBC
HCT: 21.4 % — ABNORMAL LOW (ref 36.0–46.0)
HCT: 24.5 % — ABNORMAL LOW (ref 36.0–46.0)
Hemoglobin: 6.9 g/dL — ABNORMAL LOW (ref 12.0–15.0)
Hemoglobin: 8.3 g/dL — ABNORMAL LOW (ref 12.0–15.0)
MCH: 31.5 pg (ref 26.0–34.0)
MCH: 32.3 pg (ref 26.0–34.0)
MCHC: 32.2 g/dL (ref 30.0–36.0)
MCHC: 33.9 g/dL (ref 30.0–36.0)
MCV: 97.7 fL (ref 80.0–100.0)
MCV: 95.3 fL (ref 80.0–100.0)
Platelets: 99 10*3/uL — ABNORMAL LOW (ref 150–400)
Platelets: 121 10*3/uL — ABNORMAL LOW (ref 150–400)
RBC: 2.19 MIL/uL — ABNORMAL LOW (ref 3.87–5.11)
RBC: 2.57 MIL/uL — ABNORMAL LOW (ref 3.87–5.11)
RDW: 17.5 % — ABNORMAL HIGH (ref 11.5–15.5)
RDW: 15.8 % — ABNORMAL HIGH (ref 11.5–15.5)
WBC: 9.9 10*3/uL (ref 4.0–10.5)
WBC: 13.7 10*3/uL — ABNORMAL HIGH (ref 4.0–10.5)
nRBC: 0 % (ref 0.0–0.2)
nRBC: 0.1 % (ref 0.0–0.2)

## 2022-04-30 LAB — PREPARE RBC (CROSSMATCH)

## 2022-04-30 MED ORDER — FERROUS SULFATE 325 (65 FE) MG PO TABS
325.0000 mg | ORAL_TABLET | Freq: Every day | ORAL | Status: DC
  Administered 2022-04-30 – 2022-05-07 (×8): 325 mg via ORAL
  Filled 2022-04-30 (×8): qty 1

## 2022-04-30 MED ORDER — LACTATED RINGERS IV SOLN: INTRAVENOUS | Status: DC

## 2022-04-30 MED ORDER — SODIUM CHLORIDE 0.9% FLUSH
10.0000 mL | Freq: Two times a day (BID) | INTRAVENOUS | Status: DC
  Administered 2022-04-30 – 2022-05-07 (×15): 10 mL

## 2022-04-30 MED ORDER — SODIUM CHLORIDE 0.9% FLUSH: 10.0000 mL | INTRAVENOUS | Status: DC | PRN

## 2022-04-30 MED ORDER — VITAMIN B-12 1000 MCG PO TABS
1000.0000 ug | ORAL_TABLET | Freq: Every day | ORAL | Status: DC
  Administered 2022-05-02: 1000 ug via ORAL
  Filled 2022-04-30: qty 1

## 2022-04-30 MED ORDER — SODIUM CHLORIDE 0.9% IV SOLUTION: Freq: Once | INTRAVENOUS | Status: AC

## 2022-04-30 NOTE — Progress Notes (Signed)
Peripherally Inserted Central Catheter Placement  The IV Nurse has discussed with the patient and/or persons authorized to consent for the patient, the purpose of this procedure and the potential benefits and risks involved with this procedure.  The benefits include less needle sticks, lab draws from the catheter, and the patient may be discharged home with the catheter. Risks include, but not limited to, infection, bleeding, blood clot (thrombus formation), and puncture of an artery; nerve damage and irregular heartbeat and possibility to perform a PICC exchange if needed/ordered by physician.  Alternatives to this procedure were also discussed.  Bard Power PICC patient education guide, fact sheet on infection prevention and patient information card has been provided to patient /or left at bedside.  Consent obtained via phone with POA     PICC Placement Documentation  PICC Double Lumen 04/30/22 Left Brachial 40 cm 0 cm (Active)  Indication for Insertion or Continuance of Line Poor Vasculature-patient has had multiple peripheral attempts or PIVs lasting less than 24 hours 04/30/22 1300  Exposed Catheter (cm) 0 cm 04/30/22 1300  Site Assessment Clean, Dry, Intact 04/30/22 1300  Lumen #1 Status Flushed;Saline locked;Blood return noted 04/30/22 1300  Lumen #2 Status Flushed;Saline locked;Blood return noted 04/30/22 1300  Dressing Type Transparent;Securing device 04/30/22 1300  Dressing Status Antimicrobial disc in place;Clean, Dry, Intact 04/30/22 1300  Safety Lock Not Applicable [id number]  Line Care Connections checked and tightened 04/30/22 1300  Dressing Intervention New dressing 04/30/22 1300  Dressing Change Due 05/07/22 04/30/22 1300       Darlyn Read 04/30/2022, 1:10 PM

## 2022-04-30 NOTE — Assessment & Plan Note (Signed)
Pt. Developed some AKI with Cr increased to 1.5 today. -Giving some gentle IVF -Continue to monitor

## 2022-04-30 NOTE — Plan of Care (Signed)
  Problem: Education: Goal: Knowledge of General Education information will improve Description: Including pain rating scale, medication(s)/side effects and non-pharmacologic comfort measures Outcome: Progressing   Problem: Health Behavior/Discharge Planning: Goal: Ability to manage health-related needs will improve Outcome: Progressing   Problem: Clinical Measurements: Goal: Ability to maintain clinical measurements within normal limits will improve Outcome: Progressing Goal: Will remain free from infection Outcome: Progressing Goal: Diagnostic test results will improve Outcome: Progressing Goal: Respiratory complications will improve Outcome: Progressing Goal: Cardiovascular complication will be avoided Outcome: Progressing   Problem: Activity: Goal: Risk for activity intolerance will decrease Outcome: Progressing   Problem: Nutrition: Goal: Adequate nutrition will be maintained Outcome: Progressing   Problem: Coping: Goal: Level of anxiety will decrease Outcome: Progressing   Problem: Elimination: Goal: Will not experience complications related to bowel motility Outcome: Progressing Goal: Will not experience complications related to urinary retention Outcome: Progressing   Problem: Pain Managment: Goal: General experience of comfort will improve Outcome: Progressing   Problem: Safety: Goal: Ability to remain free from injury will improve Outcome: Progressing   Problem: Skin Integrity: Goal: Risk for impaired skin integrity will decrease Outcome: Progressing   Problem: Education: Goal: Verbalization of understanding the information provided (i.e., activity precautions, restrictions, etc) will improve Outcome: Progressing   Problem: Activity: Goal: Ability to ambulate and perform ADLs will improve Outcome: Progressing   Problem: Clinical Measurements: Goal: Postoperative complications will be avoided or minimized Outcome: Progressing   Problem:  Self-Concept: Goal: Ability to maintain and perform role responsibilities to the fullest extent possible will improve Outcome: Progressing   Problem: Pain Management: Goal: Pain level will decrease Outcome: Progressing

## 2022-04-30 NOTE — Assessment & Plan Note (Signed)
Secondary to the same fall which resulted in right humeral and right intertrochanteric femoral fracture.  S/p close reduction -Continue with pain management

## 2022-04-30 NOTE — Evaluation (Signed)
Physical Therapy Evaluation Patient Details Name: FAREEDAH LOFTIN MRN: [id number] DOB: 11/14/1935 Today's Date: 04/30/2022  History of Present Illness  Pt admitted for fall at [facility] with R shoulder fx and R hip fracture. Pt is POD 1 s/p R hip IM nailing and R shoulder closed reduction. History includes HTN, HLD, depression, anemia, and falls in past. Pt pending blood transfusion this date secondary to low Hgb.  Clinical Impression  Pt is a pleasant 87 year old female who was admitted for fall with R shoulder/hip injury. Pt is now s/p R hip IM nailing and R shoulder closed reduction. Pt confused to situation and is poor historian. Per chart, pt bedbound. Pt reports she is able to transfer from bed->wheelchair with assist. Pt performs bed mobility with total assist and very pain limited. Further mobility not performed secondary to low Hgb and pending blood transfusion. Of note, pt on 2L of O2 with inability to get accurate pulse ox reading on B hands. Unsure if pt wears O2 at baseline. Pt demonstrates deficits with cognition/pain/mobility. Would benefit from skilled PT to address above deficits and promote optimal return to PLOF; recommend transition to STR upon discharge from acute hospitalization. However if pt/family decline recommendation, would be appropriate for return to LTC.      Recommendations for follow up therapy are one component of a multi-disciplinary discharge planning process, led by the attending physician.  Recommendations may be updated based on patient status, additional functional criteria and insurance authorization.  Follow Up Recommendations Skilled nursing-short term rehab (<3 hours/day) Can patient physically be transported by private vehicle: No    Assistance Recommended at Discharge Frequent or constant Supervision/Assistance  Patient can return home with the following  Two people to help with walking and/or transfers;Two people to help with bathing/dressing/bathroom     Equipment Recommendations  (TBD)  Recommendations for Other Services       Functional Status Assessment Patient has had a recent decline in their functional status and/or demonstrates limited ability to make significant improvements in function in a reasonable and predictable amount of time     Precautions / Restrictions Precautions Precautions: Fall Required Braces or Orthoses: Sling Restrictions Weight Bearing Restrictions: Yes RUE Weight Bearing: Non weight bearing LUE Weight Bearing: Weight bearing as tolerated RLE Weight Bearing: Weight bearing as tolerated      Mobility  Bed Mobility Overal bed mobility: Needs Assistance Bed Mobility: Supine to Sit     Supine to sit: Total assist     General bed mobility comments: attempted sitting at EOB, total assist for mobility and pt yells out to stop mid way and is unable to completely finish attempt. ASssisted with respoisitoning back in bed and slide up with +2 assist    Transfers                   General transfer comment: unable to perform    Ambulation/Gait                  Stairs            Wheelchair Mobility    Modified Rankin (Stroke Patients Only)       Balance Overall balance assessment: History of Falls                                           Pertinent Vitals/Pain Pain Assessment Pain Assessment:  Faces Faces Pain Scale: Hurts worst Pain Location: R hemibody Pain Descriptors / Indicators: Operative site guarding Pain Intervention(s): Limited activity within patient's tolerance, Repositioned, Ice applied    Home Living Family/patient expects to be discharged to:: Skilled nursing facility                   Additional Comments: from [facility]    Prior Function Prior Level of Function : Needs assist;History of Falls (last six months);Patient poor historian/Family not available             Mobility Comments: recent falls, per notes,  bed bound at baseline. Pt is poor historian       Hand Dominance        Extremity/Trunk Assessment   Upper Extremity Assessment Upper Extremity Assessment: Generalized weakness (L UE grossly 3/5; R UE not tested, in sling)    Lower Extremity Assessment Lower Extremity Assessment: Generalized weakness (R LE grossly 2/5; L LE grossly 3/5)       Communication   Communication: HOH  Cognition Arousal/Alertness: Awake/alert Behavior During Therapy: WFL for tasks assessed/performed Overall Cognitive Status: Impaired/Different from baseline                                 General Comments: alert to self/place. Confused to situation        General Comments      Exercises     Assessment/Plan    PT Assessment Patient needs continued PT services  PT Problem List Decreased strength;Decreased activity tolerance;Decreased balance;Decreased mobility;Decreased cognition;Decreased safety awareness;Decreased knowledge of precautions;Pain       PT Treatment Interventions DME instruction;Gait training;Therapeutic activities;Therapeutic exercise    PT Goals (Current goals can be found in the Care Plan section)  Acute Rehab PT Goals Patient Stated Goal: to go back to [facility] PT Goal Formulation: With patient Time For Goal Achievement: 05/14/22 Potential to Achieve Goals: Poor    Frequency 7X/week     Co-evaluation               AM-PAC PT "6 Clicks" Mobility  Outcome Measure Help needed turning from your back to your side while in a flat bed without using bedrails?: Total Help needed moving from lying on your back to sitting on the side of a flat bed without using bedrails?: Total Help needed moving to and from a bed to a chair (including a wheelchair)?: Total Help needed standing up from a chair using your arms (e.g., wheelchair or bedside chair)?: Total Help needed to walk in hospital room?: Total Help needed climbing 3-5 steps with a railing? : Total 6  Click Score: 6    End of Session Equipment Utilized During Treatment: Oxygen Activity Tolerance: Patient limited by pain Patient left: in bed;with bed alarm set;with nursing/sitter in room;with SCD's reapplied Nurse Communication: Mobility status PT Visit Diagnosis: Muscle weakness (generalized) (M62.81);Difficulty in walking, not elsewhere classified (R26.2);Pain;History of falling (Z91.81) Pain - Right/Left: Right Pain - part of body: Hip    Time: [id number] PT Time Calculation (min) (ACUTE ONLY): 15 min   Charges:   PT Evaluation $PT Eval Moderate Complexity: 1 [address] PT, DPT, GCS [phone]   Shalamar Plourde 04/30/2022, 10:23 AM

## 2022-04-30 NOTE — Assessment & Plan Note (Signed)
Secondary to mechanical fall.  Acute comminuted right femoral intertrochanteric fracture with varus impaction.  S/p ORIF PT is recommending SNF. -Continue with pain management -TOC consult for placement

## 2022-04-30 NOTE — Progress Notes (Signed)
Subjective: 1 Day Post-Op Procedure(s) (LRB): INTRAMEDULLARY (IM) NAIL INTERTROCHANTERIC (Right) CLOSED REDUCTION SHOULDER (Right) Patient reports pain as mild  Resting in bed with nursing staff at bedside. Hard of hearing but denies significant pain.   Objective: Vital signs in last 24 hours: Temp:  [96.9 F (36.1 C)-98.8 F (37.1 C)] 97.8 F (36.6 C) (02/27 0611) Pulse Rate:  [80-106] 99 (02/27 0611) Resp:  [12-23] 18 (02/27 0611) BP: (78-153)/(43-92) 106/55 (02/27 0611) SpO2:  [92 %-100 %] 98 % (02/27 0611) Weight:  [56 kg] 56 kg (02/26 1439)  RUE in sling, neuro intact, mild swelling.   RLE- aquacell in place, no bleed through. Minimal swelling. Neurovasc intact distally.   Intake/Output from previous day: 02/26 0701 - 02/27 0700 In: 2590.3 [P.O.:240; I.V.:1780.3; Blood:270; IV Piggyback:300] Out: 850 [Urine:700; Blood:150] Intake/Output this shift: No intake/output data recorded.  Recent Labs    04/28/22 0842 04/28/22 1726 04/29/22 0355 04/30/22 0546  HGB 7.0* 9.2* 7.7* 6.9*   Recent Labs    04/29/22 0355 04/30/22 0546  WBC 8.4 9.9  RBC 2.51* 2.19*  HCT 23.9* 21.4*  PLT 135* 99*   Recent Labs    04/29/22 0355 04/30/22 0546  NA 137 133*  K 4.1 4.3  CL 107 106  CO2 23 19*  BUN 26* 33*  CREATININE 1.29* 1.50*  GLUCOSE 87 123*  CALCIUM 8.4* 8.4*   Recent Labs    04/28/22 0842  INR 1.1       Assessment/Plan: 1 Day Post-Op Procedure(s) (LRB): INTRAMEDULLARY (IM) NAIL INTERTROCHANTERIC (Right) CLOSED REDUCTION SHOULDER (Right)   Patient doing well today. Hard of hearing but doing well.Plan as below.   POSTOPERATIVE PLAN: She will be weightbearing as tolerated on the right lower extremity.  She will be in a sling/shoulder immobilizer and allowed shoulder pendulums, elbow wrist and hand range of motion for the right upper extremity. Ok to start DVT ppx POD#1 -aspirin 325 mg daily Dressing change by nursing staff as needed to keep dressing  clean and dry Outpatient f/u in clinic in 2 weeks for staple removal and xrays of right hip and right shoulde    Edison Nasuti Christion Leonhard PA-C 04/30/2022, 7:59 AM

## 2022-04-30 NOTE — Progress Notes (Addendum)
Progress Note   Patient: Bianca Horton [id number] DOB: [birth year] DOA: 04/28/2022     2 DOS: the patient was seen and examined on 04/30/2022   Brief hospital course: Taken from H&P.  Bianca Horton is a 87 y.o. female with medical history significant of HTN, HLD, depression, CKD-3b, anemia, AAA, HOH, early stage of dementia, hypothyroidism, who presents with fall, pain right shoulder and right hip.   Patient has ambulatory dysfunction at baseline, and is bed bound. This early morning, pt forgot that she is not able to walk safely, and this morning got out of bed and tried to walk, falling over and landing on her right side. No LOC. She injured her right shoulder and right hip, causing pain which is constant, sharp, severe, nonradiating, aggravated by movement.   Data reviewed independently and ED Course: pt was found to have WBC 11.2, hemoglobin 7.0 (7.8 on 01/28/2022), stable renal function.  Chest x-ray negative.  CT of C-spine negative. CT of head is negative for acute intracranial abnormalities, but showed mild right posterior parietal scalp hematoma. X-ray showed right shoulder with displaced right proximal humeral fracture and x-ray of right hip with comminuted right intertrochanteric fracture.  Orthopedic was consulted and patient will be going to the OR for nailing of right hip and ORIF of right shoulder.  2/26: Vital stable.  Hemoglobin at 7.7, it showed improvement to 9.2 after getting 1 unit of PRBC.  Ordered 1 more unit as she is going for surgery today. Rest of the labs seems stable with resolution of leukocytosis.  Patient is going to the OR today.  2/27: Vitals stable with low normal blood pressure, s/p closed reduction left shoulder and ORIF of right hip.  Hemoglobin decreased to 6.9, anemia panel consistent with anemia of chronic disease and B12 deficiency.  Ordered 1 unit of PRBC and she will continue on supplements.  She will be weightbearing as tolerated on right lower  extremity and right upper extremity will remain in sling.  AKI today with creatinine bumped to 1.50.  Getting some gentle IV fluid.  PICC line was placed after multiple failed attempts.  Need to be removed before discharge.  PT is recommending SNF, patient is a long-term resident at [facility], hopefully can go back there with rehab in 1 to 2 days   Assessment and Plan: * Closed right hip fracture ([place]) Secondary to mechanical fall.  Acute comminuted right femoral intertrochanteric fracture with varus impaction.  S/p ORIF PT is recommending SNF. -Continue with pain management -TOC consult for placement  Closed fracture of right proximal humerus Secondary to the same fall which resulted in right humeral and right intertrochanteric femoral fracture.  S/p close reduction -Continue with pain management  Bilateral pubic rami fractures (HCC) CT also showed nonacute bilateral pubic rami fractures, nondisplaced and subtle . -Continue with pain management  Essential hypertension Blood pressure started trending up.  Initial home antihypertensives were held due to softer blood pressure on admission. -Restarting home Coreg -Will add low-dose enalapril as needed  Stage 3b chronic kidney disease (CKD) ([place]) Pt. Developed some AKI with Cr increased to 1.5 today. -Giving some gentle IVF -Continue to monitor  Fall at home, initial encounter PT/OT evaluation after the surgery  Hyperlipidemia -Continue Zocor  Hypothyroidism -Continue Synthroid  Macrocytic anemia Further decrease in hemoglobin with fall and resulted multiple fractures.  Hemoglobin at 6.9 this morning after the surgery, s/p 2 units of PRBC given on admission. Anemia panel consistent with anemia of  chronic disease and B12 deficiency. -Third unit of PRBC ordered today -Monitor hemoglobin -B12 supplement -Transfuse if below 7  Tobacco abuse -Nicotine patch  Depression with anxiety -Continue home medication: Olanzapine,  Lexapro, Wellbutrin       Subjective: Patient denies any significant pain when seen today.  Physical Exam: Vitals:   04/30/22 0611 04/30/22 0805 04/30/22 1402 04/30/22 1432  BP: (!) 106/55 104/68 (!) 90/54 (!) 89/57  Pulse: 99 98 87 87  Resp: '18 14 16 16  '$ Temp: 97.8 F (36.6 C) 98.1 F (36.7 C) 98.1 F (36.7 C) 97.6 F (36.4 C)  TempSrc: Oral Oral Oral Oral  SpO2: 98% 100% 96% 98%  Weight:      Height:       General.  Frail elderly lady, in no acute distress. Pulmonary.  Lungs clear bilaterally, normal respiratory effort. CV.  Regular rate and rhythm, no JVD, rub or murmur. Abdomen.  Soft, nontender, nondistended, BS positive. CNS.  Alert and oriented .  No focal neurologic deficit. Extremities.  Right upper extremity in sling with significant bruising and edema of arm, no LE edema.  Right hip with clean bandage Psychiatry.  Judgment and insight appears impaired  Data Reviewed: Prior data reviewed  Family Communication: Discussed with niece Bianca Horton who is her POA.  Disposition: Status is: Inpatient Remains inpatient appropriate because: Severity of illness  Planned Discharge Destination: Skilled nursing facility  Time spent: 42 minutes  .sad  Author: Lorella Nimrod, MD 04/30/2022 3:12 PM  For on call review [email or web address].

## 2022-04-30 NOTE — Evaluation (Signed)
Occupational Therapy Evaluation Patient Details Name: Bianca Horton MRN: [id number] DOB: [birth year] Today's Date: 04/30/2022   History of Present Illness Pt admitted for fall at [facility] with R shoulder fx and R hip fracture. Pt is POD 1 s/p R hip IM nailing and R shoulder closed reduction. History includes HTN, HLD, depression, anemia, and falls in past. Pt pending blood transfusion this date secondary to low Hgb.   Clinical Impression   Pt seen for OT evaluation this date, POD#1 from above surgery. Pt is questionable historian, however states she was working with therapy at her facility and was walking with +2 for WC follow. Pt is eager to return to PLOF with less pain and improved safety and independence. Pt currently requires MAX A for LB ADL management at bed level, MAX-TOTAL A +2 for sup>sit t/fs, and MIN A for UB grooming and feeding at bed level 2/2 decreased AROM of RUE, & R hip. Pt instructed in self care skills, falls prevention strategies, home/routines modifications, DME/AE for LB bathing and dressing tasks, and compression stocking mgt strategies. Pt would benefit from additional instruction in self care skills and techniques to help maintain precautions with or without assistive devices to support recall and carryover prior to discharge. Recommend STR upon hospital DC.        Recommendations for follow up therapy are one component of a multi-disciplinary discharge planning process, led by the attending physician.  Recommendations may be updated based on patient status, additional functional criteria and insurance authorization.   Follow Up Recommendations  Skilled nursing-short term rehab (<3 hours/day)     Assistance Recommended at Discharge Frequent or constant Supervision/Assistance  Patient can return home with the following Two people to help with walking and/or transfers;Two people to help with bathing/dressing/bathroom;Assist for transportation;Help with stairs or ramp for  entrance    Functional Status Assessment  Patient has had a recent decline in their functional status and demonstrates the ability to make significant improvements in function in a reasonable and predictable amount of time.  Equipment Recommendations  BSC/3in1    Recommendations for Other Services       Precautions / Restrictions Precautions Precautions: Fall Required Braces or Orthoses: Sling Restrictions Weight Bearing Restrictions: Yes RUE Weight Bearing: Non weight bearing LUE Weight Bearing: Weight bearing as tolerated RLE Weight Bearing: Weight bearing as tolerated      Mobility Bed Mobility Overal bed mobility: Needs Assistance             General bed mobility comments: Not tested, pt significantly pain limited.    Transfers                          Balance Overall balance assessment: History of Falls                                         ADL either performed or assessed with clinical judgement   ADL Overall ADL's : Needs assistance/impaired                                       General ADL Comments: Pt significantly functionally limited by increased pain, generalized weakness, decreased functional use of her RUE/RLE, and decreased cognition. She requires TOTAL A for funcitonal mobility attempts prior to OT session.  MOD A for Rolling R<>L in bed, MAX A for Peri-care and LB dressing (doff/don hospital socks at bed level).     Vision Patient Visual Report: No change from baseline       Perception     Praxis      Pertinent Vitals/Pain Pain Assessment Pain Assessment: Faces Faces Pain Scale: Hurts whole lot Pain Location: R Leg Pain Descriptors / Indicators: Operative site guarding, Grimacing, Guarding Pain Intervention(s): Limited activity within patient's tolerance, Monitored during session, RN gave pain meds during session     Hand Dominance Right   Extremity/Trunk Assessment Upper Extremity  Assessment Upper Extremity Assessment: Generalized weakness;RUE deficits/detail (LUE generally weak, decreased active shoulder flexion.) RUE Deficits / Details: NWB, in sling   Lower Extremity Assessment Lower Extremity Assessment: RLE deficits/detail RLE Deficits / Details: s/p R IM nail.       Communication Communication Communication: HOH   Cognition Arousal/Alertness: Awake/alert Behavior During Therapy: WFL for tasks assessed/performed Overall Cognitive Status: No family/caregiver present to determine baseline cognitive functioning                                 General Comments: No family present to determine baseline. Oriented to self and limited situation. Not aware she is in the hospital.     General Comments       Exercises Other Exercises Other Exercises: Pt educated on role of OT in acute setting, bed mobility techniques, compensatory strategies for ADL management in setting of NWB RUE.   Shoulder Instructions      Home Living Family/patient expects to be discharged to:: Skilled nursing facility                                 Additional Comments: from [place]      Prior Functioning/Environment Prior Level of Function : Needs assist;History of Falls (last six months);Patient poor historian/Family not available             Mobility Comments: recent falls, per notes, bed bound at baseline. Pt is poor historian          OT Problem List: Decreased strength;Decreased range of motion;Decreased activity tolerance;Decreased safety awareness;Pain;Decreased knowledge of use of DME or AE;Impaired balance (sitting and/or standing);Decreased cognition      OT Treatment/Interventions: Self-care/ADL training;Therapeutic activities;Therapeutic exercise;DME and/or AE instruction;Patient/family education;Balance training;Energy conservation    OT Goals(Current goals can be found in the care plan section) Acute Rehab OT  Goals Patient Stated Goal: To go home OT Goal Formulation: With patient Time For Goal Achievement: 05/14/22 Potential to Achieve Goals: Fair ADL Goals Pt Will Perform Grooming: sitting;with set-up;with supervision Pt Will Perform Lower Body Dressing: sit to/from stand;with min assist;with adaptive equipment Pt Will Transfer to Toilet: bedside commode;stand pivot transfer;with mod assist Pt Will Perform Toileting - Clothing Manipulation and hygiene: sit to/from stand;with mod assist;with adaptive equipment  OT Frequency: Min 2X/week    Co-evaluation              AM-PAC OT "6 Clicks" Daily Activity     Outcome Measure Help from another person eating meals?: A Little Help from another person taking care of personal grooming?: A Little Help from another person toileting, which includes using toliet, bedpan, or urinal?: Total Help from another person bathing (including washing, rinsing, drying)?: Total Help from another person to put on and taking  off regular upper body clothing?: A Lot Help from another person to put on and taking off regular lower body clothing?: A Lot 6 Click Score: 12   End of Session    Activity Tolerance: Patient limited by pain Patient left: in bed;with call bell/phone within reach;with bed alarm set;with nursing/sitter in room  OT Visit Diagnosis: Muscle weakness (generalized) (M62.81);Pain Pain - Right/Left: Right Pain - part of body: Shoulder;Arm;Hip;Knee;Leg                Time: [id number] OT Time Calculation (min): 28 min Charges:  OT General Charges $OT Visit: 1 Visit OT Evaluation $OT Eval Moderate Complexity: 1 Mod OT Treatments $Self Care/Home Management : 23-37 mins  Shara Blazing, M.S., OTR/L 04/30/22, 4:25 PM

## 2022-04-30 NOTE — Plan of Care (Signed)

## 2022-04-30 NOTE — Assessment & Plan Note (Signed)
Further decrease in hemoglobin with fall and resulted multiple fractures.  Hemoglobin at 6.9 this morning after the surgery, s/p 2 units of PRBC given on admission. Anemia panel consistent with anemia of chronic disease and B12 deficiency. -Third unit of PRBC ordered today -Monitor hemoglobin -B12 supplement -Transfuse if below 7

## 2022-05-01 DIAGNOSIS — F0390 Unspecified dementia without behavioral disturbance: Secondary | ICD-10-CM | POA: Diagnosis not present

## 2022-05-01 DIAGNOSIS — S42211A Unspecified displaced fracture of surgical neck of right humerus, initial encounter for closed fracture: Secondary | ICD-10-CM

## 2022-05-01 DIAGNOSIS — E440 Moderate protein-calorie malnutrition: Secondary | ICD-10-CM | POA: Insufficient documentation

## 2022-05-01 DIAGNOSIS — S72001A Fracture of unspecified part of neck of right femur, initial encounter for closed fracture: Secondary | ICD-10-CM | POA: Diagnosis not present

## 2022-05-01 LAB — BPAM RBC
Blood Product Expiration Date: 202403022359
Blood Product Expiration Date: 202403192359
Blood Product Expiration Date: 202403202359
ISSUE DATE / TIME: 202402251326
ISSUE DATE / TIME: 202402261012
ISSUE DATE / TIME: 202402271413
Unit Type and Rh: 600
Unit Type and Rh: 6200
Unit Type and Rh: 6200

## 2022-05-01 LAB — TYPE AND SCREEN
ABO/RH(D): A POS
Antibody Screen: NEGATIVE
Unit division: 0
Unit division: 0
Unit division: 0

## 2022-05-01 LAB — CBC
HCT: 21 % — ABNORMAL LOW (ref 36.0–46.0)
Hemoglobin: 7.1 g/dL — ABNORMAL LOW (ref 12.0–15.0)
MCH: 32.3 pg (ref 26.0–34.0)
MCHC: 33.8 g/dL (ref 30.0–36.0)
MCV: 95.5 fL (ref 80.0–100.0)
Platelets: 110 10*3/uL — ABNORMAL LOW (ref 150–400)
RBC: 2.2 MIL/uL — ABNORMAL LOW (ref 3.87–5.11)
RDW: 16.1 % — ABNORMAL HIGH (ref 11.5–15.5)
WBC: 12.3 10*3/uL — ABNORMAL HIGH (ref 4.0–10.5)
nRBC: 0.2 % (ref 0.0–0.2)

## 2022-05-01 LAB — BASIC METABOLIC PANEL
Anion gap: 6 (ref 5–15)
BUN: 40 mg/dL — ABNORMAL HIGH (ref 8–23)
CO2: 21 mmol/L — ABNORMAL LOW (ref 22–32)
Calcium: 8.3 mg/dL — ABNORMAL LOW (ref 8.9–10.3)
Chloride: 106 mmol/L (ref 98–111)
Creatinine, Ser: 1.5 mg/dL — ABNORMAL HIGH (ref 0.44–1.00)
GFR, Estimated: 34 mL/min — ABNORMAL LOW (ref >60)
Glucose, Bld: 122 mg/dL — ABNORMAL HIGH (ref 70–99)
Potassium: 3.8 mmol/L (ref 3.5–5.1)
Sodium: 133 mmol/L — ABNORMAL LOW (ref 135–145)

## 2022-05-01 NOTE — Plan of Care (Signed)
  Problem: Education: Goal: Knowledge of General Education information will improve Description: Including pain rating scale, medication(s)/side effects and non-pharmacologic comfort measures Outcome: Progressing   Problem: Clinical Measurements: Goal: Ability to maintain clinical measurements within normal limits will improve Outcome: Progressing Goal: Will remain free from infection Outcome: Progressing   Problem: Activity: Goal: Risk for activity intolerance will decrease Outcome: Progressing   Problem: Elimination: Goal: Will not experience complications related to bowel motility Outcome: Progressing Goal: Will not experience complications related to urinary retention Outcome: Progressing   Problem: Pain Managment: Goal: General experience of comfort will improve Outcome: Progressing   Problem: Safety: Goal: Ability to remain free from injury will improve Outcome: Progressing   Problem: Skin Integrity: Goal: Risk for impaired skin integrity will decrease Outcome: Progressing   Problem: Activity: Goal: Ability to ambulate and perform ADLs will improve Outcome: Progressing   Problem: Pain Management: Goal: Pain level will decrease Outcome: Progressing

## 2022-05-01 NOTE — Progress Notes (Signed)
PT Cancellation Note  Patient Details Name: ERNA DIOSDADO MRN: [id number] DOB: [birth year]   Cancelled Treatment:    Reason Eval/Treat Not Completed: Other (comment). Pt asleep upon arrival. Awakens with tactile cues. Pt very agitated with any touch and tells this therapist to leave her alone and to not mess with her. Will re-attempt another time.   Elchonon Maxson 05/01/2022, 11:15 AM Greggory Stallion, PT, DPT, GCS [phone]

## 2022-05-01 NOTE — Progress Notes (Signed)
Progress Note   Patient: Bianca Horton [id number] DOB: Apr 29, 1935 DOA: 04/28/2022     3 DOS: the patient was seen and examined on 05/01/2022   Brief hospital course: Bianca Horton is a 87 y.o. female with medical history significant of HTN, HLD, depression, CKD-3b, anemia, AAA, HOH, early stage of dementia, hypothyroidism.  They presented with fall, pain right shoulder and right hip. Patient has ambulatory dysfunction at baseline, and is bed bound. On day of presentation, she attempted to get out of bed and tried to walk, falling over and landing on her right side. No LOC.    ED Course: WBC 11.2, hemoglobin 7.0 (7.8 on 01/28/2022), stable renal function.  Chest x-ray negative.  CT of C-spine negative. CT of head is negative for acute intracranial abnormalities, but showed mild right posterior parietal scalp hematoma. X-ray showed right shoulder with displaced right proximal humeral fracture and x-ray of right hip with comminuted right intertrochanteric fracture.   Orthopedic was consulted and patient taken to OR for nailing of right hip and ORIF of right shoulder.   2/26: Vital stable.  Hemoglobin at 7.7, it showed improvement to 9.2 after getting 1 unit of PRBC.  Ordered 1 more unit as she is going for surgery today. Patient is going to the OR today.   2/27: Vitals stable with low normal blood pressure, s/p closed reduction left shoulder and ORIF of right hip.  Hemoglobin decreased to 6.9, anemia panel consistent with anemia of chronic disease and B12 deficiency.  Ordered 1 unit of PRBC and she will continue on supplements.  She will be weightbearing as tolerated on right lower extremity and right upper extremity will remain in sling.  AKI today with creatinine bumped to 1.50.  Getting some gentle IV fluid.  PICC line was placed after multiple failed attempts.  Need to be removed before discharge.   PT is recommending SNF, patient is a long-term resident at [facility].  Assessment  and Plan: Closed right hip fracture (Bianca Horton) Secondary to mechanical fall.  Acute comminuted right femoral intertrochanteric fracture with varus impaction.  S/p ORIF 2/26 PT is recommending SNF. -Continue with pain management -TOC consult for placement  Closed fracture of right proximal humerus Secondary to the same fall which resulted in right humeral and right intertrochanteric femoral fracture.  S/p close reduction 2/26 -Continue with pain management - PT/OT  Bilateral pubic rami fractures (HCC) CT also showed nonacute bilateral pubic rami fractures, nondisplaced and subtle . -Continue with pain management - PT/OT  Essential hypertension- hypotensive post op which has now resolved.  -Restarting home Coreg -home enalapril held currently  CKDIII-  Cr stable at 1.5. baseline closer to 1.0 -Giving some gentle IVF -Continue to monitor  Fall at home, initial encounter PT/OT evaluation after the surgery  Hyperlipidemia -Continue Zocor  Hypothyroidism -Continue Synthroid  Macrocytic anemia- has received 3 units pRBCs total this admission. Hgb significant drop again today. Examined her surgical wounds which did not appear to be actively bleeding but bandages were significantly saturated with blood this am. Asked NT to change them. Anemia panel consistent with anemia of chronic disease and B12 deficiency. Hgb 9.2>7.7>6.9>8.3>7.1. - repeat CBC am. Transfusion threshold <7  Tobacco abuse -Nicotine patch PRN  Depression with anxiety -Continue home medication: Olanzapine, Lexapro, Wellbutrin       Subjective: patient states she has no complaints or pain. I ask her about her arm and leg fractures and she tells me that they are not hers and everyone here  is crazy.   Physical Exam: Vitals:   04/30/22 1432 04/30/22 1717 04/30/22 1752 04/30/22 2337  BP: (!) 89/57 (!) 112/55 131/81 129/65  Pulse: 87 87 84 85  Resp: '16 16 15 18  '$ Temp: 97.6 F (36.4 C) 97.7 F (36.5 C) 97.6 F  (36.4 C) (!) 97.4 F (36.3 C)  TempSrc: Oral Oral    SpO2: 98% 98% 100% 100%  Weight:      Height:       General.  Frail elderly lady, in no acute distress. Pulmonary.  Lungs clear bilaterally, normal respiratory effort. CV.  Regular rate and rhythm, no JVD, rub or murmur. Abdomen.  Soft, nontender, nondistended, BS positive. CNS.  Alert and oriented .  No focal neurologic deficit. Extremities.  Right upper extremity in sling with significant bruising and edema of arm, no LE edema.  Right hip with saturated bandages. No active bleeding from incisions Psychiatry.  Judgment and insight appears impaired  Data Reviewed: CBC    Component Value Date/Time   WBC 12.3 (H) 05/01/2022 0324   RBC 2.20 (L) 05/01/2022 0324   HGB 7.1 (L) 05/01/2022 0324   HCT 21.0 (L) 05/01/2022 0324   PLT 110 (L) 05/01/2022 0324   MCV 95.5 05/01/2022 0324   MCH 32.3 05/01/2022 0324   MCHC 33.8 05/01/2022 0324   RDW 16.1 (H) 05/01/2022 0324   LYMPHSABS 1.3 04/28/2022 0842   MONOABS 0.8 04/28/2022 0842   EOSABS 0.2 04/28/2022 0842   BASOSABS 0.0 04/28/2022 0842      Latest Ref Rng & Units 05/01/2022    3:24 AM 04/30/2022    5:46 AM 04/29/2022    3:55 AM  BMP  Glucose 70 - 99 mg/dL 122  123  87   BUN 8 - 23 mg/dL 40  33  26   Creatinine 0.44 - 1.00 mg/dL 1.50  1.50  1.29   Sodium 135 - 145 mmol/L 133  133  137   Potassium 3.5 - 5.1 mmol/L 3.8  4.3  4.1   Chloride 98 - 111 mmol/L 106  106  107   CO2 22 - 32 mmol/L '21  19  23   '$ Calcium 8.9 - 10.3 mg/dL 8.3  8.4  8.4    Family Communication: Discussed with niece Bianca Horton who is her POA.  Disposition: Status is: Inpatient Remains inpatient appropriate because: Severity of illness  Planned Discharge Destination: Skilled nursing facility as early as 2/29 with stable hgb and ortho clearance  Time spent: 35 minutes   Author: Richarda Osmond, MD 05/01/2022 7:31 AM  For on call review [email or web address].

## 2022-05-01 NOTE — Progress Notes (Signed)
Nutrition Follow-up  DOCUMENTATION CODES:   Non-severe (moderate) malnutrition in context of chronic illness  INTERVENTION:   -Ensure Enlive po BID, each supplement provides 350 kcal and 20 grams of protein -MVI with minerals daily -Magic cup TID with meals, each supplement provides 290 kcal and 9 grams of protein  -Feeding assistance with meals  NUTRITION DIAGNOSIS:   Moderate Malnutrition related to chronic illness as evidenced by mild fat depletion, mild muscle depletion.  Ongoing   GOAL:   Patient will meet greater than or equal to 90% of their needs  Progressing   MONITOR:   PO intake, Supplement acceptance  REASON FOR ASSESSMENT:   Consult Assessment of nutrition requirement/status, Hip fracture protocol  ASSESSMENT:   Pt with a past history of hypertension, CKD, aortic aneurysm who presents due to a fall.  2/26- s/p Right INTRAMEDULLARY (IM) NAIL INTERTROCHANTERIC, CLOSED REDUCTION SHOULDER  2/28- s/p BSE- downgraded to dysphagia 2 diet with thin liquids  Reviewed I/O's: +1.3 L x 24 hours and +4.3 L since admission   Pt lethargic at time of visit. She did not respond to voice or touch.  Pt currently on a dysphagia 2 diet with thin liquids. Noted meal completions 0-15%. Ensure on counter, unopened. Noted pt has having difficulty eating over the weekend due to not having access to her dentures.   Reviewed wt hx; pt has experienced a 8.9% wt loss over the past 11 months, which is not significant for time frame, but concerning secondary to advanced age and multiple co-morbidities. Pt also with edema, which may be masking further weight loss as well as fat and muscle depletions.   Medications reviewed and include vitamin B-12 and ferrous sulfate.   Per TOC notes, plan to d/ac back to [facility] once medically stable.   Labs reviewed: Na: 133.    NUTRITION - FOCUSED PHYSICAL EXAM:  Flowsheet Row Most Recent Value  Orbital Region Mild depletion  Upper  Arm Region Mild depletion  Thoracic and Lumbar Region No depletion  Buccal Region Mild depletion  Temple Region Mild depletion  Clavicle Bone Region No depletion  Clavicle and Acromion Bone Region No depletion  Scapular Bone Region No depletion  Dorsal Hand No depletion  Patellar Region Mild depletion  Anterior Thigh Region Mild depletion  Posterior Calf Region Mild depletion  Edema (RD Assessment) Mild  Hair Reviewed  Eyes Reviewed  Mouth Reviewed  Skin Reviewed  Nails Reviewed       Diet Order:   Diet Order             DIET DYS 2 Room service appropriate? Yes with Assist; Fluid consistency: Thin  Diet effective now                   EDUCATION NEEDS:   No education needs have been identified at this time  Skin:  Skin Assessment: Skin Integrity Issues: Skin Integrity Issues:: Incisions, Other (Comment) Incisions: closed rt hip Other: skin tear lt arm  Last BM:  04/29/22  Height:   Ht Readings from Last 1 Encounters:  04/29/22 5' 4"$  (1.626 m)    Weight:   Wt Readings from Last 1 Encounters:  04/29/22 56 kg    Ideal Body Weight:  54.5 kg  BMI:  Body mass index is 21.19 kg/m.  Estimated Nutritional Needs:   Kcal:  1500-1700  Protein:  75-90 grams  Fluid:  > 1.5 L    Loistine Chance, RD, LDN, [place] Registered Dietitian II Certified Diabetes Care  and Education Specialist Please refer to [facility] for RD and/or RD on-call/weekend/after hours pager

## 2022-05-01 NOTE — TOC Progression Note (Signed)
Transition of Care [facility]) - Progression Note    Patient Details  Name: Bianca Horton MRN: [id number] Date of Birth: November 29, 1935  Transition of Care [facility]) CM/SW Contact  Conception Oms, RN Phone Number: 05/01/2022, 2:40 PM  Clinical Narrative:    Spoke with the [place] on the phone and confirmed that the patient was a long term resident at [facility], they plan for her to return to [facility] as a long term patient     Expected Discharge Plan: [place] Barriers to Discharge: No Barriers Identified  Expected Discharge Plan and Services   Discharge Planning Services: CM Consult   Living arrangements for the past 2 months: [place]                                       Social Determinants of Health (SDOH) Interventions SDOH Screenings   Food Insecurity: No Food Insecurity (04/28/2022)  Housing: Low Risk  (04/28/2022)  Transportation Needs: No Transportation Needs (04/28/2022)  Utilities: Not At Risk (04/28/2022)  Tobacco Use: High Risk (04/30/2022)    Readmission Risk Interventions     No data to display

## 2022-05-01 NOTE — Care Management Important Message (Signed)
Important Message  Patient Details  Name: Bianca Horton MRN: [id number] Date of Birth: 10/17/1935   Medicare Important Message Given:  N/A - LOS <3 / Initial given by admissions     Juliann Pulse A Ernst Cumpston 05/01/2022, 8:20 AM

## 2022-05-02 ENCOUNTER — Inpatient Hospital Stay: Payer: Medicare Other

## 2022-05-02 DIAGNOSIS — S72001A Fracture of unspecified part of neck of right femur, initial encounter for closed fracture: Secondary | ICD-10-CM | POA: Diagnosis not present

## 2022-05-02 DIAGNOSIS — S32591S Other specified fracture of right pubis, sequela: Secondary | ICD-10-CM | POA: Diagnosis not present

## 2022-05-02 DIAGNOSIS — S32592S Other specified fracture of left pubis, sequela: Secondary | ICD-10-CM | POA: Diagnosis not present

## 2022-05-02 DIAGNOSIS — S42211A Unspecified displaced fracture of surgical neck of right humerus, initial encounter for closed fracture: Secondary | ICD-10-CM | POA: Diagnosis not present

## 2022-05-02 LAB — PREPARE RBC (CROSSMATCH)

## 2022-05-02 LAB — CBC
HCT: 23 % — ABNORMAL LOW (ref 36.0–46.0)
Hemoglobin: 7.8 g/dL — ABNORMAL LOW (ref 12.0–15.0)
MCH: 32.5 pg (ref 26.0–34.0)
MCHC: 33.9 g/dL (ref 30.0–36.0)
MCV: 95.8 fL (ref 80.0–100.0)
Platelets: 135 10*3/uL — ABNORMAL LOW (ref 150–400)
RBC: 2.4 MIL/uL — ABNORMAL LOW (ref 3.87–5.11)
RDW: 16.6 % — ABNORMAL HIGH (ref 11.5–15.5)
WBC: 9.4 10*3/uL (ref 4.0–10.5)
nRBC: 0.2 % (ref 0.0–0.2)

## 2022-05-02 LAB — BASIC METABOLIC PANEL
Anion gap: 4 — ABNORMAL LOW (ref 5–15)
BUN: 42 mg/dL — ABNORMAL HIGH (ref 8–23)
CO2: 23 mmol/L (ref 22–32)
Calcium: 8.5 mg/dL — ABNORMAL LOW (ref 8.9–10.3)
Chloride: 107 mmol/L (ref 98–111)
Creatinine, Ser: 1.41 mg/dL — ABNORMAL HIGH (ref 0.44–1.00)
GFR, Estimated: 37 mL/min — ABNORMAL LOW (ref >60)
Glucose, Bld: 69 mg/dL — ABNORMAL LOW (ref 70–99)
Potassium: 4.4 mmol/L (ref 3.5–5.1)
Sodium: 134 mmol/L — ABNORMAL LOW (ref 135–145)

## 2022-05-02 MED ORDER — SODIUM CHLORIDE 0.9 % IV BOLUS
1000.0000 mL | Freq: Once | INTRAVENOUS | Status: AC
  Administered 2022-05-02: 1000 mL via INTRAVENOUS

## 2022-05-02 MED ORDER — SODIUM CHLORIDE 0.9 % IV SOLN: INTRAVENOUS | Status: AC

## 2022-05-02 MED ORDER — SODIUM CHLORIDE 0.9% IV SOLUTION: Freq: Once | INTRAVENOUS | Status: DC

## 2022-05-02 MED ORDER — CARVEDILOL 12.5 MG PO TABS
12.5000 mg | ORAL_TABLET | Freq: Two times a day (BID) | ORAL | Status: DC
  Administered 2022-05-03 – 2022-05-07 (×9): 12.5 mg via ORAL
  Filled 2022-05-02 (×9): qty 1

## 2022-05-02 NOTE — TOC Progression Note (Signed)
Transition of Care [facility]) - Progression Note    Patient Details  Name: Bianca Horton MRN: [id number] Date of Birth: 1935-03-25  Transition of Care [facility]) CM/SW Heath, LCSW Phone Number: 05/02/2022, 3:54 PM  Clinical Narrative:  Patient is agreeable to rehab when she returns to SNF if insurance will approve. Started auth.  Expected Discharge Plan: Long Term Nursing Home Barriers to Discharge: No Barriers Identified  Expected Discharge Plan and Services   Discharge Planning Services: CM Consult   Living arrangements for the past 2 months: Skilled Nursing Facility                                       Social Determinants of Health (SDOH) Interventions SDOH Screenings   Food Insecurity: No Food Insecurity (04/28/2022)  Housing: Low Risk  (04/28/2022)  Transportation Needs: No Transportation Needs (04/28/2022)  Utilities: Not At Risk (04/28/2022)  Tobacco Use: High Risk (04/30/2022)    Readmission Risk Interventions     No data to display

## 2022-05-02 NOTE — Progress Notes (Signed)
Report given to [facility] charge RN, patient going to room 241.

## 2022-05-02 NOTE — Progress Notes (Signed)
Patient made aware of transfer to another room.  Transferred to 2A room 241, transfer uneventful.

## 2022-05-02 NOTE — Progress Notes (Addendum)
Progress Note   Patient: Bianca Horton [id number] DOB: 1935-11-26 DOA: 04/28/2022     4 DOS: the patient was seen and examined on 05/02/2022   Brief hospital course: Bianca Horton is a 87 y.o. female with medical history significant of HTN, HLD, depression, CKD-3b, anemia, AAA, HOH, early stage of dementia, hypothyroidism.  They presented with fall, pain right shoulder and right hip. Patient has ambulatory dysfunction at baseline, and is bed bound. On day of presentation, she attempted to get out of bed and tried to walk, falling over and landing on her right side. No LOC.    ED Course: WBC 11.2, hemoglobin 7.0 (7.8 on 01/28/2022), stable renal function.  Chest x-ray negative.  CT of C-spine negative. CT of head is negative for acute intracranial abnormalities, but showed mild right posterior parietal scalp hematoma. X-ray showed right shoulder with displaced right proximal humeral fracture and x-ray of right hip with comminuted right intertrochanteric fracture.   Orthopedic was consulted and patient taken to OR for nailing of right hip and ORIF of right shoulder.   2/26: Vital stable.  Hemoglobin at 7.7, it showed improvement to 9.2 after getting 1 unit of PRBC.  Ordered 1 more unit as she is going for surgery today. Patient is going to the OR today.   2/27: Vitals stable with low normal blood pressure, s/p closed reduction left shoulder and ORIF of right hip.  Hemoglobin decreased to 6.9, anemia panel consistent with anemia of chronic disease and B12 deficiency.  Ordered 1 unit of PRBC and she will continue on supplements.  She will be weightbearing as tolerated on right lower extremity and right upper extremity will remain in sling.  AKI today with creatinine bumped to 1.50.  Getting some gentle IV fluid.  PICC line was placed after multiple failed attempts.  Need to be removed before discharge.   PT is recommending SNF, patient is a long-term resident at [facility].  2/29: UPDATE  afternoon: notified by nursing staff of BP of [id number] systolics. Because patient is alert and asymptomatic, question if our reads are accurate on her leg. Both arms have restrictions for taking BP though.  - treat with STAT fluid bolus - type and screen and ordered 1 unit RBC for hgb decline and continued bleeding to support hemodynamic stability.  - continue tele, transfer to progressive, BP q83mn  Her RUE has significantly worsened swelling, suspect hematoma and may be cause of hgb drop. Patient is pain free and pleasantly confused at rest with arm in sling - contacted ortho to reevaluate - ordered arm CT - asa stopped  Assessment and Plan: Closed right hip fracture ([place] Secondary to mechanical fall.  Acute comminuted right femoral intertrochanteric fracture with varus impaction.  S/p ORIF 2/26 PT is recommending SNF. -Continue with pain management -TOC consult for placement  Closed fracture of right proximal humerus Secondary to the same fall which resulted in right humeral and right intertrochanteric femoral fracture.  S/p close reduction 2/26 -Continue with pain management - PT/OT  Bilateral pubic rami fractures (HCC) CT also showed nonacute bilateral pubic rami fractures, nondisplaced and subtle . -Continue with pain management - PT/OT  Essential hypertension- mildly hypotensive to low normal Bps. Will decrease home coreg dose - home Coreg at half dose -home enalapril held currently  CKDIII-  Cr stable at 1.5>1.4. baseline closer to 1.0 -Giving some gentle IVF -Continue to monitor  Hyperlipidemia -Continue Zocor  Hypothyroidism -Continue Synthroid  Macrocytic anemia- has received 3 units  pRBCs total this admission. Hgb stable today.  Examined her surgical wounds which did not appear to be actively bleeding but bandages again were significantly saturated with blood this am. Asked assigned nurse to change them. Anemia panel consistent with anemia of chronic disease and B12  deficiency. Hgb 9.2>7.7>6.9>8.3>7.1>7.8. - repeat CBC am. Transfusion threshold <7  Tobacco abuse -Nicotine patch PRN  Depression with anxiety -Continue home medication: Olanzapine, Lexapro, Wellbutrin      Subjective: patient reports no complaints at rest. Denies pain. No SOB.   Physical Exam: Vitals:   04/30/22 2337 05/01/22 0823 05/01/22 1639 05/01/22 2350  BP: 129/65 120/63 (!) 112/59 118/62  Pulse: 85 75 81 82  Resp: '18 17 17 17  '$ Temp: (!) 97.4 F (36.3 C) 97.6 F (36.4 C) 97.8 F (36.6 C) 98 F (36.7 C)  TempSrc:    Oral  SpO2: 100% 100% 95% 97%  Weight:      Height:       General.  Frail elderly lady, in no acute distress. Pulmonary.  Lungs clear bilaterally, normal respiratory effort. CV.  Regular rate and rhythm, no JVD, rub or murmur. Abdomen.  Soft, nontender, nondistended, BS positive. CNS.  Alert and oriented to self and location.  No focal neurologic deficit. Extremities.  Right upper extremity in sling with significant bruising and edema of arm, no LE edema.  Right hip with saturated bandages. No active bleeding from incisions. No signs of infection at surgical sites.  Psychiatry.  Judgment and insight appears impaired  Data Reviewed: CBC    Component Value Date/Time   WBC 9.4 05/02/2022 0628   RBC 2.40 (L) 05/02/2022 0628   HGB 7.8 (L) 05/02/2022 0628   HCT 23.0 (L) 05/02/2022 0628   PLT 135 (L) 05/02/2022 0628   MCV 95.8 05/02/2022 0628   MCH 32.5 05/02/2022 0628   MCHC 33.9 05/02/2022 0628   RDW 16.6 (H) 05/02/2022 0628   LYMPHSABS 1.3 04/28/2022 0842   MONOABS 0.8 04/28/2022 0842   EOSABS 0.2 04/28/2022 0842   BASOSABS 0.0 04/28/2022 0842      Latest Ref Rng & Units 05/02/2022    6:28 AM 05/01/2022    3:24 AM 04/30/2022    5:46 AM  BMP  Glucose 70 - 99 mg/dL 69  122  123   BUN 8 - 23 mg/dL 42  40  33   Creatinine 0.44 - 1.00 mg/dL 1.41  1.50  1.50   Sodium 135 - 145 mmol/L 134  133  133   Potassium 3.5 - 5.1 mmol/L 4.4  3.8  4.3    Chloride 98 - 111 mmol/L 107  106  106   CO2 22 - 32 mmol/L '23  21  19   '$ Calcium 8.9 - 10.3 mg/dL 8.5  8.3  8.4    Family Communication: disposition discussion with family and TOC. No medical updates.   Disposition: Status is: Inpatient Remains inpatient appropriate because: Severity of illness  Planned Discharge Destination: Skilled nursing facility as early as 2/29 with stable hgb and ortho clearance  Time spent: 35 minutes   Author: Richarda Osmond, MD 05/02/2022 7:22 AM  For on call review [email or web address].

## 2022-05-02 NOTE — NC FL2 (Signed)
[place] LEVEL OF CARE FORM     IDENTIFICATION  Patient Name: Bianca Horton Birthdate: Oct 30, 1935 Sex: female Admission Date (Current Location): 04/28/2022  [place] Number:  Engineering geologist and Address:  [facility], [address], [place], [place] [address]      Provider Number: [id number]  Attending Physician Name and Address:  Richarda Osmond, MD  Relative Name and Phone Number:       Current Level of Care: Hospital Recommended Level of Care: [place] Prior Approval Number:    Date Approved/Denied:   PASRR Number: [id number]  Discharge Plan: SNF    Current Diagnoses: Patient Active Problem List   Diagnosis Date Noted   Closed fracture of neck of right humerus 05/01/2022   Dementia without behavioral disturbance ([place]) 05/01/2022   Malnutrition of moderate degree 05/01/2022   Closed right hip fracture ([place]) 04/28/2022   Closed fracture of right proximal humerus 04/28/2022   Macrocytic anemia 04/28/2022   Bilateral pubic rami fractures ([place]) 04/28/2022   Depression with anxiety 04/28/2022   Closed fracture of left proximal humerus 01/25/2022   Closed fracture of proximal end of left humerus, unspecified fracture morphology, initial encounter 01/23/2022   Pelvic fracture ([place]) 12/09/2021   Closed nondisplaced fracture of right acetabulum ([place]) 12/07/2021   Intractable pain 12/06/2021   Closed nondisplaced fracture of right acetabulum, unspecified portion of acetabulum, initial encounter ([place]) 12/06/2021   Hypothyroidism 12/06/2021   Inferior pubic ramus fracture-right 12/06/2021   Fall at home, initial encounter 12/06/2021   Stage 3b chronic kidney disease (CKD) ([place]) 12/06/2021   Tobacco abuse 12/06/2021   Bronchitis 12/06/2021   Thrombocytopenia ([place]) 12/06/2021   Acute bronchitis 12/06/2021   Essential hypertension 10/19/2019   Hyperlipidemia 10/19/2019   DJD (degenerative joint  disease) 10/19/2019   AAA (abdominal aortic aneurysm) without rupture ([place]) 10/18/2019    Orientation RESPIRATION BLADDER Height & Weight     Self, Time, Situation, Place  Normal Incontinent Weight: 123 lb 7.3 oz (56 kg) Height:  '5\' 4"'$  (162.6 cm)  BEHAVIORAL SYMPTOMS/MOOD NEUROLOGICAL BOWEL NUTRITION STATUS   (None)  (Dementia) Continent Diet (DYS 2. Extra Gravy on meats, potatoes.)  AMBULATORY STATUS COMMUNICATION OF NEEDS Skin   Extensive Assist Verbally Skin abrasions, Other (Comment), Bruising, Surgical wounds (Erythema/redness, petechiae, scratch marks. Skin tear on left upper anterior arm (foam). Incision on right hip (hydrocolloid prn).)                       Personal Care Assistance Level of Assistance  Bathing, Feeding, Dressing Bathing Assistance: Maximum assistance Feeding assistance: Limited assistance Dressing Assistance: Maximum assistance     Functional Limitations Info  Sight, Hearing, Speech Sight Info: Adequate Hearing Info: Adequate Speech Info: Adequate    SPECIAL CARE FACTORS FREQUENCY  PT (By licensed PT), OT (By licensed OT)     PT Frequency: 5 x week OT Frequency: 5 x week            Contractures Contractures Info: Not present    Additional Factors Info  Code Status, Allergies, Psychotropic Code Status Info: DNR Allergies Info: Clopidogrel, Sertraline Hcl Psychotropic Info: Anxiety, Depression         Current Medications (05/02/2022):  This is the current hospital active medication list Current Facility-Administered Medications  Medication Dose Route Frequency Provider Last Rate Last Admin   acetaminophen (TYLENOL) tablet 325-650 mg  325-650 mg Oral Q6H PRN Renee Harder, MD  aspirin EC tablet 325 mg  325 mg Oral Q breakfast Renee Harder, MD   325 mg at 05/02/22 [id number]   buPROPion (WELLBUTRIN XL) 24 hr tablet 150 mg  150 mg Oral Daily Renee Harder, MD   150 mg at 05/02/22 [id number]   carvedilol (COREG) tablet 12.5 mg   12.5 mg Oral BID WC Richarda Osmond, MD       Chlorhexidine Gluconate Cloth 2 % PADS 6 each  6 each Topical Q0600 Renee Harder, MD   6 each at 05/02/22 0700   cilostazol (PLETAL) tablet 100 mg  100 mg Oral BID Renee Harder, MD   100 mg at 05/02/22 [id number]   escitalopram (LEXAPRO) tablet 10 mg  10 mg Oral Daily Renee Harder, MD   10 mg at 05/02/22 [id number]   feeding supplement (ENSURE ENLIVE / ENSURE PLUS) liquid 237 mL  237 mL Oral BID BM Renee Harder, MD   237 mL at 05/02/22 1359   ferrous sulfate tablet 325 mg  325 mg Oral Q breakfast Lorella Nimrod, MD   325 mg at [id number]   folic acid (FOLVITE) tablet 1 mg  1 mg Oral Daily Renee Harder, MD   1 mg at 05/02/22 [id number]   HYDROcodone-acetaminophen (NORCO/VICODIN) 5-325 MG per tablet 1-2 tablet  1-2 tablet Oral Q4H PRN Renee Harder, MD       levothyroxine (SYNTHROID) tablet 125 mcg  125 mcg Oral Q0600 Renee Harder, MD   125 mcg at 05/02/22 0531   menthol-cetylpyridinium (CEPACOL) lozenge 3 mg  1 lozenge Oral PRN Renee Harder, MD       Or   phenol (CHLORASEPTIC) mouth spray 1 spray  1 spray Mouth/Throat PRN Renee Harder, MD       methocarbamol (ROBAXIN) tablet 500 mg  500 mg Oral Q8H PRN Renee Harder, MD       multivitamin with minerals tablet 1 tablet  1 tablet Oral Daily Renee Harder, MD   1 tablet at 05/02/22 [id number]   mupirocin ointment (BACTROBAN) 2 % 1 Application  1 Application Nasal BID Renee Harder, MD   1 Application at [id number] 0919   nicotine (NICODERM CQ - dosed in mg/24 hours) patch 21 mg  21 mg Transdermal Daily Renee Harder, MD   21 mg at 05/02/22 0917   OLANZapine (ZYPREXA) tablet 2.5 mg  2.5 mg Oral Dewain Penning, MD   2.5 mg at 05/01/22 2212   ondansetron [facility]) tablet 4 mg  4 mg Oral Q6H PRN Renee Harder, MD       Or   ondansetron [facility]) injection 4 mg  4 mg Intravenous Q6H PRN Renee Harder, MD       simvastatin (ZOCOR) tablet 40 mg  40 mg Oral Dewain Penning, MD   40 mg at 05/01/22 2213   sodium chloride flush (NS) 0.9 % injection 10-40 mL  10-40 mL Intracatheter Q12H Lorella Nimrod, MD   10 mL at 05/02/22 0919   sodium chloride flush (NS) 0.9 % injection 10-40 mL  10-40 mL Intracatheter PRN Lorella Nimrod, MD         Discharge Medications: Please see discharge summary for a list of discharge medications.  Relevant Imaging Results:  Relevant Lab Results:   Additional Information SS#: [id number]  Candie Chroman, LCSW

## 2022-05-02 NOTE — Progress Notes (Signed)
PT hypotensive, 55/46, pt is responsive, A&O x 4, no complaints offered.  Dr. Ouida Sills at bedside, aware of pt current condition, orders made.

## 2022-05-02 NOTE — Care Management Important Message (Signed)
Important Message  Patient Details  Name: Bianca Horton MRN: [id number] Date of Birth: [birth year]   Medicare Important Message Given:  Yes     Juliann Pulse A Lisbeth Puller 05/02/2022, 9:53 AM

## 2022-05-02 NOTE — Progress Notes (Signed)
Subjective: 3 Days Post-Op Procedure(s) (LRB): INTRAMEDULLARY (IM) NAIL INTERTROCHANTERIC (Right) CLOSED REDUCTION SHOULDER (Right) Patient postop day 3 from right hip intramedullary nail fixation of femur fracture and closed reduction of right shoulder humerus with surgical neck fracture.  Patient is resting comfortably in the bed today.  She denies pain.  She does not recall surgery.  There is some confusion but follows commands fine.  She is hard of hearing.  She states she is hungry.  Objective: Vital signs in last 24 hours: Temp:  [97.9 F (36.6 C)-98.4 F (36.9 C)] 98.4 F (36.9 C) (02/29 1558) Pulse Rate:  [72-89] 89 (02/29 1632) Resp:  [17-18] 18 (02/29 1627) BP: (55-118)/(36-62) 93/50 (02/29 1632) SpO2:  [95 %-99 %] 95 % (02/29 1627)  RUE in sling, there is swelling about the right upper extremity most pronounced throughout the humeral region.  The skin is intact without wound or opening.  There is ecchymosis.  The hand shows no significant sinus swelling.  She is neurovascularly intact moves all the phalanges appropriately and has intact sensation.  RLE-interval Aquacel changes present with no bleedthrough.  There is no sign of erythema in the surrounding proximal hip.  There is ecchymosis to the right lower extremity without significant swelling.  She is neurovascular intact distally.  Intake/Output from previous day: 02/28 0701 - 02/29 0700 In: 0  Out: 500 [Urine:500] Intake/Output this shift: Total I/O In: 600 [P.O.:600] Out: 650 [Urine:650]  Recent Labs    04/30/22 0546 04/30/22 2005 05/01/22 0324 05/02/22 0628  HGB 6.9* 8.3* 7.1* 7.8*   Recent Labs    05/01/22 0324 05/02/22 0628  WBC 12.3* 9.4  RBC 2.20* 2.40*  HCT 21.0* 23.0*  PLT 110* 135*   Recent Labs    05/01/22 0324 05/02/22 0628  NA 133* 134*  K 3.8 4.4  CL 106 107  CO2 21* 23  BUN 40* 42*  CREATININE 1.50* 1.41*  GLUCOSE 122* 69*  CALCIUM 8.3* 8.5*   No results for input(s): "LABPT",  "INR" in the last 72 hours.      Assessment/Plan: 3 Days Post-Op Procedure(s) (LRB): INTRAMEDULLARY (IM) NAIL INTERTROCHANTERIC (Right) CLOSED REDUCTION SHOULDER (Right)   Patient is status post closed reduction of the right shoulder humeral neck fracture and IM nail placement for right hip intertrochanteric fracture.  There is swelling about the right upper extremity that is consistent with a proximal humerus fracture.  She is neurovascularly intact in the right upper extremity with strong palpable pulse and follows apparent commands appropriately to make a full composite fist moves all the digits appropriately.  The right lower extremity shows intact Aquacel's with some mild postoperative ecchymosis. -She will continue the sling.  See below for Dr. Nathanial Millman shoulder restrictions -See below for postoperative course for the hip -Anticipate SNF  POSTOPERATIVE PLAN: She will be weightbearing as tolerated on the right lower extremity.  She will be in a sling/shoulder immobilizer and allowed shoulder pendulums, elbow wrist and hand range of motion for the right upper extremity. Ok to start DVT ppx POD#1 -aspirin 325 mg daily Dressing change by nursing staff as needed to keep dressing clean and dry Outpatient f/u in clinic in 2 weeks for staple removal and xrays of right hip and right shoulde    Edison Nasuti Kyrstyn Greear PA-C 05/02/2022, 4:42 PM

## 2022-05-02 NOTE — Plan of Care (Signed)
  Problem: Clinical Measurements: Goal: Ability to maintain clinical measurements within normal limits will improve Outcome: Progressing Goal: Will remain free from infection Outcome: Progressing Goal: Respiratory complications will improve Outcome: Progressing   Problem: Nutrition: Goal: Adequate nutrition will be maintained Outcome: Progressing   Problem: Pain Managment: Goal: General experience of comfort will improve Outcome: Progressing   Problem: Safety: Goal: Ability to remain free from injury will improve Outcome: Progressing   Problem: Skin Integrity: Goal: Risk for impaired skin integrity will decrease Outcome: Progressing   Problem: Activity: Goal: Ability to ambulate and perform ADLs will improve Outcome: Progressing   Problem: Pain Management: Goal: Pain level will decrease Outcome: Progressing

## 2022-05-02 NOTE — Progress Notes (Signed)
Physical Therapy Treatment Patient Details Name: Bianca Horton MRN: [id number] DOB: 14-Jul-1935 Today's Date: 05/02/2022   History of Present Illness Pt admitted for fall at [facility] with R shoulder fx and R hip fracture. Pt is POD 1 s/p R hip IM nailing and R shoulder closed reduction. History includes HTN, HLD, depression, anemia, and falls in past. Pt pending blood transfusion this date secondary to low Hgb.    PT Comments    Pt is making limited progress towards goals and is limited by cognition/pain/mobility. Pt unaware of surgery and restrictions. Agreeable to bed level there-ex only with poor tolerance. When attempted mobility efforts including rolling in bed, pt adamantly refusing. Appears in pain with movement. Poor rehab potential, decreased frequency in POC.  Recommendations for follow up therapy are one component of a multi-disciplinary discharge planning process, led by the attending physician.  Recommendations may be updated based on patient status, additional functional criteria and insurance authorization.  Follow Up Recommendations  Skilled nursing-short term rehab (<3 hours/day) Can patient physically be transported by private vehicle: No   Assistance Recommended at Discharge Frequent or constant Supervision/Assistance  Patient can return home with the following Two people to help with walking and/or transfers;Two people to help with bathing/dressing/bathroom   Equipment Recommendations   (TBD)    Recommendations for Other Services       Precautions / Restrictions Precautions Precautions: Fall Restrictions Weight Bearing Restrictions: Yes RUE Weight Bearing: Non weight bearing LUE Weight Bearing: Weight bearing as tolerated RLE Weight Bearing: Weight bearing as tolerated     Mobility  Bed Mobility               General bed mobility comments: multiple attempts made, however pt unwilling to participate in mobility    Transfers                         Ambulation/Gait                   Stairs             Wheelchair Mobility    Modified Rankin (Stroke Patients Only)       Balance                                            Cognition Arousal/Alertness: Awake/alert Behavior During Therapy: WFL for tasks assessed/performed Overall Cognitive Status: History of cognitive impairments - at baseline                                 General Comments: no family present at bedside. Pt confused to situation. General disinterest in participation in mobility session        Exercises Other Exercises Other Exercises: supine ther-ex performed, minimal participation. Very limited tolerance for R hip hip abd/add. Also performed SLRs. Cognition limiting further participation in skilled there-x    General Comments        Pertinent Vitals/Pain Pain Assessment Pain Assessment: Faces Faces Pain Scale: Hurts even more Pain Location: R Leg Pain Descriptors / Indicators: Operative site guarding, Grimacing, Guarding Pain Intervention(s): Limited activity within patient's tolerance, Repositioned    Home Living  Prior Function            PT Goals (current goals can now be found in the care plan section) Acute Rehab PT Goals Patient Stated Goal: to go back to [facility] PT Goal Formulation: With patient Time For Goal Achievement: 05/14/22 Potential to Achieve Goals: Poor Progress towards PT goals: Not progressing toward goals - comment    Frequency    Min 2X/week      PT Plan Frequency needs to be updated    Co-evaluation              AM-PAC PT "6 Clicks" Mobility   Outcome Measure  Help needed turning from your back to your side while in a flat bed without using bedrails?: Total Help needed moving from lying on your back to sitting on the side of a flat bed without using bedrails?: Total Help needed moving to and from a bed to a chair  (including a wheelchair)?: Total Help needed standing up from a chair using your arms (e.g., wheelchair or bedside chair)?: Total Help needed to walk in hospital room?: Total Help needed climbing 3-5 steps with a railing? : Total 6 Click Score: 6    End of Session   Activity Tolerance: Patient limited by pain Patient left: in bed;with bed alarm set Nurse Communication: Mobility status PT Visit Diagnosis: Muscle weakness (generalized) (M62.81);Difficulty in walking, not elsewhere classified (R26.2);Pain;History of falling (Z91.81) Pain - Right/Left: Right Pain - part of body: Hip     Time: [id number] PT Time Calculation (min) (ACUTE ONLY): 9 min  Charges:  $Therapeutic Exercise: 8-22 mins                     Greggory Stallion, PT, DPT, GCS [phone]    Bianca Horton 05/02/2022, 4:08 PM

## 2022-05-02 NOTE — Progress Notes (Signed)
   05/02/22 1558  Assess: MEWS Score  Temp 98.4 F (36.9 C)  BP (!) 55/46 (Notified RN)  MAP (mmHg) (!) 51 (Notified RN)  Pulse Rate 87  Resp 17  Level of Consciousness Alert  SpO2 95 %  O2 Device Room Air  Patient Activity (if Appropriate) In bed  Assess: MEWS Score  MEWS Temp 0  MEWS Systolic 3  MEWS Pulse 0  MEWS RR 0  MEWS LOC 0  MEWS Score 3  MEWS Score Color Yellow  Assess: if the MEWS score is Yellow or Red  Were vital signs taken at a resting state? Yes  Focused Assessment No change from prior assessment  Does the patient meet 2 or more of the SIRS criteria? No  MEWS guidelines implemented  Yes, yellow  Treat  MEWS Interventions Considered administering scheduled or prn medications/treatments as ordered  Take Vital Signs  Increase Vital Sign Frequency  Yellow: Q2hr x1, continue Q4hrs until patient remains green for 12hrs  Escalate  MEWS: Escalate Yellow: Discuss with charge nurse and consider notifying provider and/or RRT  Notify: Charge Nurse/RN  Name of Charge Nurse/RN Notified Bre Chrage RN  Provider Notification  Provider Name/Title Doristine Mango MD  Date Provider Notified 05/02/22  Time Provider Notified 1559  Method of Notification Face-to-face  Notification Reason Change in status (hypotension)  Provider response At bedside;See new orders  Date of Provider Response 05/02/22  Time of Provider Response 1559  Assess: SIRS CRITERIA  SIRS Temperature  0  SIRS Pulse 0  SIRS Respirations  0  SIRS WBC 0  SIRS Score Sum  0   Dr. Ouida Sills at bedside, with new orders placed.

## 2022-05-03 LAB — CBC
HCT: 22.8 % — ABNORMAL LOW (ref 36.0–46.0)
Hemoglobin: 7.7 g/dL — ABNORMAL LOW (ref 12.0–15.0)
MCH: 32.9 pg (ref 26.0–34.0)
MCHC: 33.8 g/dL (ref 30.0–36.0)
MCV: 97.4 fL (ref 80.0–100.0)
Platelets: 145 K/uL — ABNORMAL LOW (ref 150–400)
RBC: 2.34 MIL/uL — ABNORMAL LOW (ref 3.87–5.11)
RDW: 17.3 % — ABNORMAL HIGH (ref 11.5–15.5)
WBC: 9.4 K/uL (ref 4.0–10.5)
nRBC: 0.2 % (ref 0.0–0.2)

## 2022-05-03 LAB — CBC WITH DIFFERENTIAL/PLATELET
Abs Immature Granulocytes: 0.09 K/uL — ABNORMAL HIGH (ref 0.00–0.07)
Basophils Absolute: 0 K/uL (ref 0.0–0.1)
Basophils Relative: 0 %
Eosinophils Absolute: 0.4 K/uL (ref 0.0–0.5)
Eosinophils Relative: 4 %
HCT: 31 % — ABNORMAL LOW (ref 36.0–46.0)
Hemoglobin: 10.3 g/dL — ABNORMAL LOW (ref 12.0–15.0)
Immature Granulocytes: 1 %
Lymphocytes Relative: 15 %
Lymphs Abs: 1.5 K/uL (ref 0.7–4.0)
MCH: 31.1 pg (ref 26.0–34.0)
MCHC: 33.2 g/dL (ref 30.0–36.0)
MCV: 93.7 fL (ref 80.0–100.0)
Monocytes Absolute: 0.5 K/uL (ref 0.1–1.0)
Monocytes Relative: 5 %
Neutro Abs: 7.5 K/uL (ref 1.7–7.7)
Neutrophils Relative %: 75 %
Platelets: 146 K/uL — ABNORMAL LOW (ref 150–400)
RBC: 3.31 MIL/uL — ABNORMAL LOW (ref 3.87–5.11)
RDW: 18.3 % — ABNORMAL HIGH (ref 11.5–15.5)
WBC: 9.9 K/uL (ref 4.0–10.5)
nRBC: 0.2 % (ref 0.0–0.2)

## 2022-05-03 NOTE — Progress Notes (Signed)
Physical Therapy Treatment Patient Details Name: Bianca Horton MRN: [id number] DOB: [birth year] Today's Date: 05/03/2022   History of Present Illness Pt admitted for fall at [facility] with R shoulder fx and R hip fracture. Pt is POD 1 s/p R hip IM nailing and R shoulder closed reduction. History includes HTN, HLD, depression, anemia, and falls in past. Pt pending blood transfusion this date secondary to low Hgb.    PT Comments    Patient seen for PT session. She was agreeable with encouragement but prefers to be left alone in bed. She required +2 person assistance for bed mobility with limited to no participation with sitting upright. She is able to maintain sitting balance without external support from therapist. Unable to attempt standing due to limited sitting tolerance, patient requesting to return to bed. PT will continue to follow to maximize independence and decrease caregiver burden.    Recommendations for follow up therapy are one component of a multi-disciplinary discharge planning process, led by the attending physician.  Recommendations may be updated based on patient status, additional functional criteria and insurance authorization.  Follow Up Recommendations  Skilled nursing-short term rehab (<3 hours/day) Can patient physically be transported by private vehicle: No   Assistance Recommended at Discharge Frequent or constant Supervision/Assistance  Patient can return home with the following Two people to help with walking and/or transfers;Two people to help with bathing/dressing/bathroom   Equipment Recommendations   (to be determined at next venue of care)    Recommendations for Other Services       Precautions / Restrictions Precautions Precautions: Fall Required Braces or Orthoses: Sling Restrictions Weight Bearing Restrictions: Yes RUE Weight Bearing: Non weight bearing LUE Weight Bearing: Weight bearing as tolerated RLE Weight Bearing: Weight bearing as tolerated      Mobility  Bed Mobility Overal bed mobility: Needs Assistance Bed Mobility: Supine to Sit     Supine to sit: Total assist, +2 for physical assistance     General bed mobility comments: assistance for BLE and trunk support. cues for task initiation and sequencing with minimal active participation    Transfers Overall transfer level: Needs assistance                Lateral/Scoot Transfers: Total assist, +2 physical assistance General transfer comment: patient does not participate with scooting towards head of bed. standing not attempted due to poor sitting tolerance    Ambulation/Gait                   Stairs             Wheelchair Mobility    Modified Rankin (Stroke Patients Only)       Balance Overall balance assessment: History of Falls Sitting-balance support: Single extremity supported Sitting balance-Leahy Scale: Fair Sitting balance - Comments: limited sitting tolerance                                    Cognition Arousal/Alertness: Awake/alert Behavior During Therapy: WFL for tasks assessed/performed Overall Cognitive Status: History of cognitive impairments - at baseline                                 General Comments: patient is very HOH and frequent repetition required        Exercises      General Comments General comments (skin integrity,  edema, etc.): RUE sling in place throughout session      Pertinent Vitals/Pain Pain Assessment Pain Assessment: Faces Faces Pain Scale: Hurts whole lot Pain Location: R Leg Pain Descriptors / Indicators: Guarding Pain Intervention(s): Limited activity within patient's tolerance    Home Living                          Prior Function            PT Goals (current goals can now be found in the care plan section) Acute Rehab PT Goals Patient Stated Goal: to have less pain PT Goal Formulation: With patient Time For Goal Achievement:  05/14/22 Potential to Achieve Goals: Poor Progress towards PT goals: Progressing toward goals    Frequency    Min 2X/week      PT Plan Current plan remains appropriate    Co-evaluation   Reason for Co-Treatment: Necessary to address cognition/behavior during functional activity;For patient/therapist safety;To address functional/ADL transfers PT goals addressed during session: Mobility/safety with mobility;Balance OT goals addressed during session: ADL's and self-care      AM-PAC PT "6 Clicks" Mobility   Outcome Measure  Help needed turning from your back to your side while in a flat bed without using bedrails?: Total Help needed moving from lying on your back to sitting on the side of a flat bed without using bedrails?: Total Help needed moving to and from a bed to a chair (including a wheelchair)?: Total Help needed standing up from a chair using your arms (e.g., wheelchair or bedside chair)?: Total Help needed to walk in hospital room?: Total Help needed climbing 3-5 steps with a railing? : Total 6 Click Score: 6    End of Session   Activity Tolerance: Patient limited by pain;Patient limited by fatigue Patient left: in bed;with call bell/phone within reach;with bed alarm set Nurse Communication: Mobility status PT Visit Diagnosis: Muscle weakness (generalized) (M62.81);Difficulty in walking, not elsewhere classified (R26.2);Pain;History of falling (Z91.81) Pain - Right/Left: Right Pain - part of body: Hip     Time: 1012-1020 PT Time Calculation (min) (ACUTE ONLY): 8 min  Charges:  $Therapeutic Activity: 8-22 mins                     Minna Merritts, PT, MPT    [name] 05/03/2022, 12:45 PM

## 2022-05-03 NOTE — Progress Notes (Signed)
Patient is transferred to 1A room 157. Attempting to call accepting RN for report, RN giving direct patient care in contact room. Will call back in 10 minutes.

## 2022-05-03 NOTE — Progress Notes (Signed)
Occupational Therapy Treatment Patient Details Name: Bianca Horton MRN: [id number] DOB: [birth year] Today's Date: 05/03/2022   History of present illness Pt admitted for fall at [facility] with R shoulder fx and R hip fracture. Pt is POD 1 s/p R hip IM nailing and R shoulder closed reduction. History includes HTN, HLD, depression, anemia, and falls in past. Pt pending blood transfusion this date secondary to low Hgb.   OT comments  Pt seen for OT treatment on this date for follow up regarding safety with ADL management and functional mobility. Pt continues to be functionally limited by increased pain with mobility, decreased functional use of her R (dominant) UE, and decreased functional use of her RLE. She requires gentle encouragement to participate in therapy session with ongoing education on importance of functional activity during hospital stay t/o session. Pt continues to require TOTAL A to perform bed mobility and lateral scooting at EOB. She is able to sit EOB for ~ 5 min with supervision for safety. See ADL section below for additional detail. Pt continues to benefit from skilled OT services to maximize return to PLOF and minimize risk of future falls, injury, caregiver burden, and readmission. Will continue to follow POC. Discharge recommendation remains appropriate.     Recommendations for follow up therapy are one component of a multi-disciplinary discharge planning process, led by the attending physician.  Recommendations may be updated based on patient status, additional functional criteria and insurance authorization.    Follow Up Recommendations  Skilled nursing-short term rehab (<3 hours/day)     Assistance Recommended at Discharge Frequent or constant Supervision/Assistance  Patient can return home with the following  Two people to help with walking and/or transfers;Two people to help with bathing/dressing/bathroom;Assist for transportation;Help with stairs or ramp for entrance    Equipment Recommendations  BSC/3in1    Recommendations for Other Services      Precautions / Restrictions Precautions Precautions: Fall Required Braces or Orthoses: Sling Restrictions Weight Bearing Restrictions: Yes RUE Weight Bearing: Non weight bearing LUE Weight Bearing: Weight bearing as tolerated RLE Weight Bearing: Weight bearing as tolerated       Mobility Bed Mobility Overal bed mobility: Needs Assistance Bed Mobility: Supine to Sit     Supine to sit: Total assist, +2 for physical assistance     General bed mobility comments: +2 TOTAL to come to sitting at EOB, pt puts forth minimal effort. Limited by RUE sling and decreased LUE functional use as well as by pain.    Transfers Overall transfer level: Needs assistance                Lateral/Scoot Transfers: +2 physical assistance, Total assist General transfer comment: +2 assist to scoot laterally toward HOB.     Balance Overall balance assessment: History of Falls, Needs assistance Sitting-balance support: Bilateral upper extremity supported, Single extremity supported (RUE in sling t/o session.) Sitting balance-Leahy Scale: Fair Sitting balance - Comments: steady static sitting with 1 UE support                                   ADL either performed or assessed with clinical judgement   ADL Overall ADL's : Needs assistance/impaired Eating/Feeding: Bed level;Minimal assistance   Grooming: Bed level;Minimal assistance                               Functional  mobility during ADLs: +2 for physical assistance;Total assistance General ADL Comments: +2 TOTAL A for sup<>sit t/f. Pt is able to sit at EOB for ~5 min with supervision for safety but c/o increased pain while sitting upright and requests return to bed.    Extremity/Trunk Assessment              Vision Patient Visual Report: No change from baseline     Perception     Praxis      Cognition  Arousal/Alertness: Awake/alert Behavior During Therapy: WFL for tasks assessed/performed Overall Cognitive Status: History of cognitive impairments - at baseline                                          Exercises Other Exercises Other Exercises: Pt educated on importance of functional mobility during hospital stay. Limited evidence of learning, pt limited by cognition. See ADL section for additional detail.    Shoulder Instructions       General Comments      Pertinent Vitals/ Pain       Pain Assessment Pain Assessment: Faces Faces Pain Scale: Hurts whole lot Pain Location: RLE/RUE Pain Descriptors / Indicators: Operative site guarding, Grimacing, Guarding Pain Intervention(s): Limited activity within patient's tolerance, Monitored during session, Repositioned, Patient requesting pain meds-RN notified  Home Living                                          Prior Functioning/Environment              Frequency  Min 2X/week        Progress Toward Goals  OT Goals(current goals can now be found in the care plan section)  Progress towards OT goals: Progressing toward goals  Acute Rehab OT Goals Patient Stated Goal: to feel better OT Goal Formulation: With patient Time For Goal Achievement: 05/14/22 Potential to Achieve Goals: [place] Discharge plan remains appropriate;Frequency remains appropriate    Co-evaluation    PT/OT/SLP Co-Evaluation/Treatment: Yes Reason for Co-Treatment: Necessary to address cognition/behavior during functional activity;For patient/therapist safety;To address functional/ADL transfers PT goals addressed during session: Mobility/safety with mobility;Balance OT goals addressed during session: ADL's and self-care      AM-PAC OT "6 Clicks" Daily Activity     Outcome Measure   Help from another person eating meals?: A Little Help from another person taking care of personal grooming?: A Little Help  from another person toileting, which includes using toliet, bedpan, or urinal?: Total Help from another person bathing (including washing, rinsing, drying)?: Total Help from another person to put on and taking off regular upper body clothing?: A Lot Help from another person to put on and taking off regular lower body clothing?: Total 6 Click Score: 11    End of Session    OT Visit Diagnosis: Muscle weakness (generalized) (M62.81);Pain Pain - Right/Left: Right Pain - part of body: Shoulder;Arm;Hip;Knee;Leg   Activity Tolerance Patient limited by pain   Patient Left in bed;with call bell/phone within reach;with bed alarm set   Nurse Communication Mobility status;Other (comment) (Pt would benefit from coordination of pain medication prior to therapy sessions in future.)        Time: 1003-1011 OT Time Calculation (min): 8 min  Charges: OT General Charges $OT Visit: 1 Visit OT  Treatments $Self Care/Home Management : 8-22 mins  Shara Blazing, M.S., OTR/L 05/03/22, 12:41 PM

## 2022-05-03 NOTE — Progress Notes (Signed)
Subjective: 4 Days Post-Op Procedure(s) (LRB): INTRAMEDULLARY (IM) NAIL INTERTROCHANTERIC (Right) CLOSED REDUCTION SHOULDER (Right) Patient postop day 4 from right hip intramedullary nail fixation of femur fracture and closed reduction of right shoulder humerus with surgical neck fracture.  Patient is resting comfortably in the bed today, pleasantly confused.  She denies pain.  She does not recall surgery.  There is some confusion but follows commands fine.  She is hard of hearing.    Nursing staff at bedside Objective: Vital signs in last 24 hours: Temp:  [97.2 F (36.2 C)-98.7 F (37.1 C)] 97.9 F (36.6 C) (03/01 1200) Pulse Rate:  [75-98] 79 (03/01 1237) Resp:  [14-18] 16 (03/01 1237) BP: (55-143)/(28-97) 100/42 (03/01 1237) SpO2:  [95 %-99 %] 98 % (03/01 1237)  RUE in sling, there is swelling about the right upper extremity most pronounced throughout the humeral region. STABLE, possible slight improvement since last.   The skin is intact without wound or opening.  There is ecchymosis.  The hand shows no significant sinus swelling.  She is neurovascularly intact moves all the phalanges appropriately and has intact sensation.  RLE-oozing into aquacel with blood, incisions intact otherwise on visual inspection. Bandages changed. RLE without pain, neuointact today.  Intake/Output from previous day: 02/29 0701 - 03/01 0700 In: 1610 [P.O.:600; I.V.:10; IV Piggyback:1000] Out: 1000 [Urine:1000] Intake/Output this shift: Total I/O In: 22.5 [Blood:22.5] Out: -   Recent Labs    04/30/22 2005 05/01/22 0324 05/02/22 0628 05/03/22 0550  HGB 8.3* 7.1* 7.8* 7.7*   Recent Labs    05/02/22 0628 05/03/22 0550  WBC 9.4 9.4  RBC 2.40* 2.34*  HCT 23.0* 22.8*  PLT 135* 145*   Recent Labs    05/01/22 0324 05/02/22 0628  NA 133* 134*  K 3.8 4.4  CL 106 107  CO2 21* 23  BUN 40* 42*  CREATININE 1.50* 1.41*  GLUCOSE 122* 69*  CALCIUM 8.3* 8.5*   No results for input(s): "LABPT",  "INR" in the last 72 hours.  CT Humerus IMPRESSION: 1. Mildly comminuted Neer 2 part surgical neck fracture of the humerus. The surgical neck fracture is substantially displaced with 4 cm anterior displacement of the dominant shaft fragment compared to the dominant humeral head fragment, and with about 1.4 cm overlap. The humeral head fragment is abducted and rotated such that the greater tuberosity is oriented posterolaterally rather than laterally. 2. Relatively nondisplaced fracture of the greater tuberosity. 3. Edema along fascia planes in the vicinity of the displaced fracture, but without a large intramuscular hematoma. There is also subcutaneous edema along the shoulder and upper arm diffusely. 4. Small right pleural effusion. 5. Atheromatous calcification in the right subclavian and right axillary arteries.    Electronically Signed   By: Van Clines M.D. CT Forearm IMPRESSION: 1. Diffuse subcutaneous edema in the forearm, most confluent along the ulnar side. No well-defined drainable abscess. 2. Skin staples along the right upper buttock with likely some underlying subcutaneous hematoma on the bottom most images. 3. Probable small right pleural effusion. 4. Chondrocalcinosis in the wrist particularly along the TFCC disc, query CPPD arthropathy. 5. Motion artifact is present on today's exam and could not be eliminated.     Electronically Signed   By: Van Clines M.D.   On: 05/02/2022 18:44    Assessment/Plan: 4 Days Post-Op Procedure(s) (LRB): INTRAMEDULLARY (IM) NAIL INTERTROCHANTERIC (Right) CLOSED REDUCTION SHOULDER (Right)   Patient is status post closed reduction of the right shoulder humeral neck fracture and IM nail  placement for right hip intertrochanteric fracture.  RUE swelling appears stable today compared to last. Some oozing at Right IM nail incisions, no infection concern, bandages changed and discussed with Nursing staff. Dr. Sharlet Salina  is aware of patients CT from PM on 2/29. Aware that it shows displaced proximal humerus fracture despite reduction. Management to be discussed with Dr. Sharlet Salina directly. Secure chat with Primary Medical Physician, Dr. Ouida Sills about this. Dr. Sharlet Salina has supplied his direct phone for this.    POSTOPERATIVE PLAN: She will be weightbearing as tolerated on the right lower extremity.  She will be in a sling/shoulder immobilizer and allowed shoulder pendulums, elbow wrist and hand range of motion for the right upper extremity. Ok to start DVT ppx POD#1 -aspirin 325 mg daily Dressing change by nursing staff as needed to keep dressing clean and dry Outpatient f/u in clinic in 2 weeks for staple removal and xrays of right hip and right shoulder    Roland Rack PA-C 05/03/2022, 12:38 PM

## 2022-05-03 NOTE — Progress Notes (Signed)
Progress Note   Patient: Bianca Horton [id number] DOB: 1935-11-25 DOA: 04/28/2022     5 DOS: the patient was seen and examined on 05/03/2022   Brief hospital course: Bianca Horton is a 87 y.o. female with medical history significant of HTN, HLD, depression, CKD-3b, anemia, AAA, HOH, early stage of dementia, hypothyroidism.  They presented with fall, pain right shoulder and right hip. Patient has ambulatory dysfunction at baseline, and is bed bound. On day of presentation, she attempted to get out of bed and tried to walk, falling over and landing on her right side. No LOC.    ED Course: WBC 11.2, hemoglobin 7.0 (7.8 on 01/28/2022), stable renal function.  Chest x-ray negative.  CT of C-spine negative. CT of head is negative for acute intracranial abnormalities, but showed mild right posterior parietal scalp hematoma. X-ray showed right shoulder with displaced right proximal humeral fracture and x-ray of right hip with comminuted right intertrochanteric fracture.   Orthopedic was consulted and patient taken to OR for nailing of right hip and ORIF of right shoulder.   2/26: Vital stable.  Hemoglobin at 7.7, it showed improvement to 9.2 after getting 1 unit of PRBC.  Ordered 1 more unit as she is going for surgery today. Patient is going to the OR today.   2/27: Vitals stable with low normal blood pressure, s/p closed reduction left shoulder and ORIF of right hip.  Hemoglobin decreased to 6.9, anemia panel consistent with anemia of chronic disease and B12 deficiency.  Ordered 1 unit of PRBC and she will continue on supplements.  She will be weightbearing as tolerated on right lower extremity and right upper extremity will remain in sling.  AKI today with creatinine bumped to 1.50.  Getting some gentle IV fluid.  PICC line was placed after multiple failed attempts.  Need to be removed before discharge.   PT is recommending SNF, patient is a long-term resident at [facility].  2/29: UPDATE  afternoon: notified by nursing staff of BP of 123456 systolics. Because patient is alert and asymptomatic, question if our reads are accurate on her leg. Both arms have restrictions for taking BP though.  - treat with STAT fluid bolus - type and screen and ordered 1 unit RBC for hgb decline and continued bleeding to support hemodynamic stability.  - continue tele, transfer to progressive, BP q55mn  Her RUE has significantly worsened swelling, suspect hematoma and may be cause of hgb drop. Patient is pain free and pleasantly confused at rest with arm in sling - contacted ortho to reevaluate - ordered arm CT - asa stopped  3/1: I ordered blood transfusion yesterday afternoon and it was never given. There was no communication to me why it wasn't started and there is no documentation as to why it wasn't started. The order is still in place. Patient continues to meet criteria for blood transfusion. I discovered the lapse in treatment at start of shift and Communicated with assigned nurse this morning that she still needs to receive. 1 unit transfusing today. Post transfusion CBC order is also still in.  Assessment and Plan: Closed right hip fracture ([place] Secondary to mechanical fall.  Acute comminuted right femoral intertrochanteric fracture with varus impaction.  S/p ORIF 2/26 PT is recommending SNF. -Continue with pain management -TOC consult for placement - continue PT/OT  Closed fracture of right proximal humerus Secondary to the same fall which resulted in right humeral and right intertrochanteric femoral fracture.  S/p close reduction 2/26. CT  scan 2/29 showing significant soft tissue swelling consistent with hematoma on exam. Fracture is ~4cm displaced -Continue with pain management - PT/OT - given still significant displacement of fracture ~4cm, reached out to ortho again to reevaluate  Bilateral pubic rami fractures (HCC) CT also showed nonacute bilateral pubic rami fractures,  nondisplaced and subtle . -Continue with pain management - PT/OT  Macrocytic anemia- received 3 units pRBCs to hgb 8.3 and has continued to decline likely due to continued hematoma leakage. Hgb 7.7 today. Blood transfusion was ordered yesterday but not given for unknown reason. Will be transfused today.  Again, surgical incision bandages are partially saturated in serosanguinous fluid.  Significant hematoma of right arm overall stable appearing from yesterday.  Anemia panel consistent with anemia of chronic disease and B12 deficiency. Transfusion threshold <7 or 8 for orthopedic procedure or hemodynamically unstable Hgb 9.2>7.7>6.9>8.3>7.1>7.8>7.7. - repeat CBC am.   Essential hypertension- mildly hypotensive to low normal Bps. Improved after fluid bolus yesterday.  - home Coreg at half dose -home enalapril held currently  CKDIII-  Cr stable at 1.5>1.4. baseline closer to 1.0 -Giving some gentle IVF -Continue to monitor  Hyperlipidemia -Continue Zocor  Hypothyroidism -Continue Synthroid  Tobacco abuse -Nicotine patch PRN  Depression with anxiety -Continue home medication: Olanzapine, Lexapro, Wellbutrin      Subjective: patient reports no complaints at rest. Denies pain. No SOB. She is confused as to where she is.   Physical Exam: Vitals:   05/02/22 1940 05/02/22 2353 05/03/22 0323 05/03/22 0735  BP: (!) 122/97 (!) 103/52 (!) 143/63 125/64  Pulse: 98 97 87 75  Resp: '17 14 14 16  '$ Temp: 98.3 F (36.8 C) 98.1 F (36.7 C) 98.7 F (37.1 C) (!) 97.2 F (36.2 C)  TempSrc: Oral     SpO2: 97% 98% 99% 98%  Weight:      Height:       General.  Frail elderly lady, in no acute distress. Pulmonary.  Lungs clear bilaterally, normal respiratory effort. CV.  Regular rate and rhythm, no JVD, rub or murmur. Abdomen.  Soft, nontender, nondistended, BS positive. CNS.  Alert and oriented to self and location.  No focal neurologic deficit. Extremities.  Right upper extremity in sling  with significant bruising and edema of arm, no LE edema.  Right hip with saturated bandages. No active bleeding from incisions. No signs of infection at surgical sites.  Psychiatry.  Judgment and insight appears impaired  Data Reviewed: CBC    Component Value Date/Time   WBC 9.4 05/03/2022 0550   RBC 2.34 (L) 05/03/2022 0550   HGB 7.7 (L) 05/03/2022 0550   HCT 22.8 (L) 05/03/2022 0550   PLT 145 (L) 05/03/2022 0550   MCV 97.4 05/03/2022 0550   MCH 32.9 05/03/2022 0550   MCHC 33.8 05/03/2022 0550   RDW 17.3 (H) 05/03/2022 0550   LYMPHSABS 1.3 04/28/2022 0842   MONOABS 0.8 04/28/2022 0842   EOSABS 0.2 04/28/2022 0842   BASOSABS 0.0 04/28/2022 0842      Latest Ref Rng & Units 05/02/2022    6:28 AM 05/01/2022    3:24 AM 04/30/2022    5:46 AM  BMP  Glucose 70 - 99 mg/dL 69  122  123   BUN 8 - 23 mg/dL 42  40  33   Creatinine 0.44 - 1.00 mg/dL 1.41  1.50  1.50   Sodium 135 - 145 mmol/L 134  133  133   Potassium 3.5 - 5.1 mmol/L 4.4  3.8  4.3  Chloride 98 - 111 mmol/L 107  106  106   CO2 22 - 32 mmol/L '23  21  19   '$ Calcium 8.9 - 10.3 mg/dL 8.5  8.3  8.4    Family Communication: disposition discussion with family and TOC. No medical updates.   Disposition: Status is: Inpatient Remains inpatient appropriate because: Severity of illness  Planned Discharge Destination: Skilled nursing facility as early as 2/29 with stable hgb and ortho clearance  Time spent: 35 minutes   Author: Richarda Osmond, MD 05/03/2022 8:05 AM  For on call review [email or web address].

## 2022-05-03 NOTE — Progress Notes (Signed)
Report given to [facility]. Patient transferred to 1a room 157 on tele and room air. CHG bath completed, pericare given, purewick changed, linens changed. R hip dressing clean, dry, and intact. R arm in sling. Patient states she is free of pain.

## 2022-05-03 NOTE — TOC Progression Note (Addendum)
Transition of Care [facility]) - Progression Note    Patient Details  Name: Bianca Horton MRN: [id number] Date of Birth: [birth year]  Transition of Care [facility]) CM/SW Contact  Laurena Slimmer, RN Phone Number: 05/03/2022, 12:55 PM  Clinical Narrative:    Per [employer] health Portal Auth approved  [id number] Navi# [id number]  approved 2/29  3/1-3/5 MD notified.    Expected Discharge Plan: Long Term Nursing Home Barriers to Discharge: No Barriers Identified  Expected Discharge Plan and Services   Discharge Planning Services: CM Consult   Living arrangements for the past 2 months: Skilled Nursing Facility                                       Social Determinants of Health (SDOH) Interventions SDOH Screenings   Food Insecurity: No Food Insecurity (04/28/2022)  Housing: Low Risk  (04/28/2022)  Transportation Needs: No Transportation Needs (04/28/2022)  Utilities: Not At Risk (04/28/2022)  Tobacco Use: High Risk (04/30/2022)    Readmission Risk Interventions     No data to display

## 2022-05-04 DIAGNOSIS — I9589 Other hypotension: Secondary | ICD-10-CM

## 2022-05-04 DIAGNOSIS — E861 Hypovolemia: Secondary | ICD-10-CM

## 2022-05-04 LAB — CBC
HCT: 26.9 % — ABNORMAL LOW (ref 36.0–46.0)
Hemoglobin: 9.1 g/dL — ABNORMAL LOW (ref 12.0–15.0)
MCH: 31.7 pg (ref 26.0–34.0)
MCHC: 33.8 g/dL (ref 30.0–36.0)
MCV: 93.7 fL (ref 80.0–100.0)
Platelets: 138 10*3/uL — ABNORMAL LOW (ref 150–400)
RBC: 2.87 MIL/uL — ABNORMAL LOW (ref 3.87–5.11)
RDW: 18.6 % — ABNORMAL HIGH (ref 11.5–15.5)
WBC: 9 10*3/uL (ref 4.0–10.5)
nRBC: 0.2 % (ref 0.0–0.2)

## 2022-05-04 LAB — BASIC METABOLIC PANEL
Anion gap: 5 (ref 5–15)
BUN: 41 mg/dL — ABNORMAL HIGH (ref 8–23)
CO2: 22 mmol/L (ref 22–32)
Calcium: 8.6 mg/dL — ABNORMAL LOW (ref 8.9–10.3)
Chloride: 107 mmol/L (ref 98–111)
Creatinine, Ser: 1.13 mg/dL — ABNORMAL HIGH (ref 0.44–1.00)
GFR, Estimated: 48 mL/min — ABNORMAL LOW (ref >60)
Glucose, Bld: 78 mg/dL (ref 70–99)
Potassium: 4.1 mmol/L (ref 3.5–5.1)
Sodium: 134 mmol/L — ABNORMAL LOW (ref 135–145)

## 2022-05-04 LAB — BPAM RBC
Blood Product Expiration Date: 202403282359
ISSUE DATE / TIME: 202403011132
Unit Type and Rh: 6200

## 2022-05-04 LAB — TYPE AND SCREEN
ABO/RH(D): A POS
Antibody Screen: NEGATIVE
Unit division: 0

## 2022-05-04 NOTE — Progress Notes (Signed)
Progress Note   Patient: Bianca Horton [id number] DOB: 1935/10/15 DOA: 04/28/2022     6 DOS: the patient was seen and examined on 05/04/2022   Brief hospital course: BREASHIA BAYARD is a 87 y.o. female with medical history significant of HTN, HLD, depression, CKD-3b, anemia, AAA, HOH, early stage of dementia, hypothyroidism.  They presented with fall, pain right shoulder and right hip. Patient has ambulatory dysfunction at baseline, and is bed bound. On day of presentation, she attempted to get out of bed and tried to walk, falling over and landing on her right side. No LOC.    ED Course: WBC 11.2, hemoglobin 7.0 (7.8 on 01/28/2022), stable renal function.  Chest x-ray negative.  CT of C-spine negative. CT of head is negative for acute intracranial abnormalities, but showed mild right posterior parietal scalp hematoma. X-ray showed right shoulder with displaced right proximal humeral fracture and x-ray of right hip with comminuted right intertrochanteric fracture.   Orthopedic was consulted and patient taken to OR for nailing of right hip and ORIF of right shoulder.   2/26: Vital stable.  Hemoglobin at 7.7, it showed improvement to 9.2 after getting 1 unit of PRBC.  Ordered 1 more unit as she is going for surgery today. Patient is going to the OR today.   2/27: Vitals stable with low normal blood pressure, s/p closed reduction left shoulder and ORIF of right hip.  Hemoglobin decreased to 6.9, anemia panel consistent with anemia of chronic disease and B12 deficiency.  Ordered 1 unit of PRBC and she will continue on supplements.  She will be weightbearing as tolerated on right lower extremity and right upper extremity will remain in sling.  AKI today with creatinine bumped to 1.50.  Getting some gentle IV fluid.  PICC line was placed after multiple failed attempts.  Need to be removed before discharge.   PT is recommending SNF, patient is a long-term resident at [facility].  2/29: UPDATE  afternoon: notified by nursing staff of BP of 123456 systolics. Because patient is alert and asymptomatic, question if our reads are accurate on her leg. Both arms have restrictions for taking BP though.  - treat with STAT fluid bolus - type and screen and ordered 1 unit RBC for hgb decline and continued bleeding to support hemodynamic stability.  - continue tele, transfer to progressive, BP q64mn  Her RUE has significantly worsened swelling, suspect hematoma and may be cause of hgb drop. Patient is pain free and pleasantly confused at rest with arm in sling - contacted ortho to reevaluate - ordered arm CT - asa stopped  3/1: 1 unit transfused. Post transfusion hgb 10.3. CT R arm showed significant soft tissue swelling of likely hematoma and ~4cm displaced humeral fracture. ortho reevaluated and patient is non-surgical.  3/2: pain-free at rest.  Hgb 9.1. I believe she is clear to discharge to SNF once bed available. Likely 3/4  Assessment and Plan: Closed right hip fracture ([place] Secondary to mechanical fall.  Acute comminuted right femoral intertrochanteric fracture with varus impaction.  S/p ORIF 2/26 PT is recommending SNF. -Continue with pain management -TOC consult for placement - continue PT/OT  Closed fracture of right proximal humerus Secondary to the same fall. S/p close reduction 2/26. CT scan 2/29 showing significant soft tissue swelling consistent with hematoma on exam. Fracture is ~4cm displaced -Continue with pain management - PT/OT - ortho re-evaluated, appreciate your care  - non-surgical.   - continue sling  Bilateral pubic rami fractures (  [place]) CT also showed nonacute bilateral pubic rami fractures, nondisplaced and subtle. -Continue with pain management - PT/OT  Macrocytic anemia- received 3 units pRBCs to hgb 8.3 and has continued to decline likely due to continued hematoma leakage. 4th unit pRBCs given 3/1 Hgb 7.7>10.3>9.1 Transfusion threshold <7 or 8 for  orthopedic procedure or hemodynamically unstable Hgb 9.2>7.7>6.9>8.3>7.1>7.8>7.7. - repeat CBC am.   Essential hypertension- Hypotension- hypotension from acute blood loss but she also has arm restriction so pressures have been measured on her lower leg and is likely inaccurate. Clinically she is asymptomatic and presenting as normotensive despite the low readings.  She continues to have strong peripheral radial and pedal pulses even when her recorded systolic blood pressures were in 50s which leads me to believe that her true systolic is at least 80.  She has shown more hemodynamic stability after blood transfusion and IV fluids.  - home Coreg at half dose -home enalapril held currently  CKDIII-  Cr stable at 1.5>1.4>1.1. baseline closer to 1.0 -Continue to monitor  Hyperlipidemia -Continue Zocor  Hypothyroidism -Continue Synthroid  Tobacco abuse -Nicotine patch PRN  Depression with anxiety -Continue home medication: Olanzapine, Lexapro, Wellbutrin      Subjective: patient reports no complaints at rest. No SOB. She requests help to eat her breakfast. I help her with a couple bites of scrambled eggs and she starts coughing. She was talking throughout coughing episode and telling me to hit her back. She drank some water and recovered. Denies arm or hip pain.  Physical Exam: Vitals:   05/03/22 1510 05/03/22 1724 05/03/22 2021 05/03/22 2249  BP: (!) 103/36 (!) 119/51 (!) 106/55 121/69  Pulse: 90 99 99 97  Resp: '18  19 18  '$ Temp: 98.5 F (36.9 C)  (!) 97.3 F (36.3 C) 98.2 F (36.8 C)  TempSrc: Oral  Oral   SpO2: 97%  100% 99%  Weight:      Height:       General.  Frail elderly lady, in no acute distress. Pulmonary.  Lungs clear bilaterally, normal respiratory effort. CV.  Regular rate and rhythm, no JVD, rub or murmur. Abdomen.  Soft, nontender, nondistended, BS positive. CNS.  Alert and oriented to self and location.  No focal neurologic deficit. Extremities.  Right  upper extremity in sling with significant bruising and edema of arm mildly improved from yesterday, no LE edema.  Right hip with clean bandages. No active bleeding from incisions. No signs of infection at surgical sites.  Psychiatry.  Judgment and insight appears impaired  Data Reviewed: CBC    Component Value Date/Time   WBC 9.0 05/04/2022 0535   RBC 2.87 (L) 05/04/2022 0535   HGB 9.1 (L) 05/04/2022 0535   HCT 26.9 (L) 05/04/2022 0535   PLT 138 (L) 05/04/2022 0535   MCV 93.7 05/04/2022 0535   MCH 31.7 05/04/2022 0535   MCHC 33.8 05/04/2022 0535   RDW 18.6 (H) 05/04/2022 0535   LYMPHSABS 1.5 05/03/2022 1806   MONOABS 0.5 05/03/2022 1806   EOSABS 0.4 05/03/2022 1806   BASOSABS 0.0 05/03/2022 1806      Latest Ref Rng & Units 05/04/2022    5:35 AM 05/02/2022    6:28 AM 05/01/2022    3:24 AM  BMP  Glucose 70 - 99 mg/dL 78  69  122   BUN 8 - 23 mg/dL 41  42  40   Creatinine 0.44 - 1.00 mg/dL 1.13  1.41  1.50   Sodium 135 - 145 mmol/L 134  134  133   Potassium 3.5 - 5.1 mmol/L 4.1  4.4  3.8   Chloride 98 - 111 mmol/L 107  107  106   CO2 22 - 32 mmol/L '22  23  21   '$ Calcium 8.9 - 10.3 mg/dL 8.6  8.5  8.3    Family Communication: disposition discussion with family and TOC. No medical updates.   Disposition: Status is: Inpatient Remains inpatient appropriate because: awaiting safe dispo plan  Planned Discharge Destination: Skilled nursing facility as early as 3/4 with stable hgb and ortho clearance  Time spent: 35 minutes   Author: Richarda Osmond, MD 05/04/2022 7:49 AM  For on call review [email or web address].

## 2022-05-05 DIAGNOSIS — D62 Acute posthemorrhagic anemia: Secondary | ICD-10-CM

## 2022-05-05 LAB — CBC
HCT: 24.3 % — ABNORMAL LOW (ref 36.0–46.0)
Hemoglobin: 8.1 g/dL — ABNORMAL LOW (ref 12.0–15.0)
MCH: 31.6 pg (ref 26.0–34.0)
MCHC: 33.3 g/dL (ref 30.0–36.0)
MCV: 94.9 fL (ref 80.0–100.0)
Platelets: 150 10*3/uL (ref 150–400)
RBC: 2.56 MIL/uL — ABNORMAL LOW (ref 3.87–5.11)
RDW: 18.6 % — ABNORMAL HIGH (ref 11.5–15.5)
WBC: 10.2 10*3/uL (ref 4.0–10.5)
nRBC: 0 % (ref 0.0–0.2)

## 2022-05-05 MED ORDER — HEPARIN SODIUM (PORCINE) 5000 UNIT/ML IJ SOLN
5000.0000 [IU] | Freq: Three times a day (TID) | INTRAMUSCULAR | Status: DC
  Administered 2022-05-05 – 2022-05-06 (×3): 5000 [IU] via SUBCUTANEOUS
  Filled 2022-05-05 (×3): qty 1

## 2022-05-05 NOTE — Progress Notes (Signed)
Progress Note   Patient: Bianca Horton [id number] DOB: Aug 04, 1935 DOA: 04/28/2022     7 DOS: the patient was seen and examined on 05/05/2022   Brief hospital course: JAMAIKA BRASSEAUX is a 87 y.o. female with medical history significant of HTN, HLD, depression, CKD-3b, anemia, AAA, HOH, early stage of dementia, hypothyroidism.  They presented with fall, pain right shoulder and right hip. Patient has ambulatory dysfunction at baseline, and is bed bound. On day of presentation, she attempted to get out of bed and tried to walk, falling over and landing on her right side. No LOC.    ED Course: WBC 11.2, hemoglobin 7.0 (7.8 on 01/28/2022), stable renal function.  Chest x-ray negative.  CT of C-spine negative. CT of head is negative for acute intracranial abnormalities, but showed mild right posterior parietal scalp hematoma. X-ray showed right shoulder with displaced right proximal humeral fracture and x-ray of right hip with comminuted right intertrochanteric fracture.   Orthopedic was consulted and patient taken to OR for nailing of right hip and ORIF of right shoulder.   2/26: Vital stable.  Hemoglobin at 7.7, it showed improvement to 9.2 after getting 1 unit of PRBC.  Ordered 1 more unit as she is going for surgery today. Patient is going to the OR today.   2/27: Vitals stable with low normal blood pressure, s/p closed reduction left shoulder and ORIF of right hip.  Hemoglobin decreased to 6.9, anemia panel consistent with anemia of chronic disease and B12 deficiency.  Ordered 1 unit of PRBC and she will continue on supplements.  She will be weightbearing as tolerated on right lower extremity and right upper extremity will remain in sling.  AKI today with creatinine bumped to 1.50.  Getting some gentle IV fluid.  PICC line was placed after multiple failed attempts.  Need to be removed before discharge.   PT is recommending SNF, patient is a long-term resident at [facility].  2/29: UPDATE  afternoon: notified by nursing staff of BP of 123456 systolics. Because patient is alert and asymptomatic, question if our reads are accurate on her leg. Both arms have restrictions for taking BP though.  - treat with STAT fluid bolus - type and screen and ordered 1 unit RBC for hgb decline and continued bleeding to support hemodynamic stability.  - continue tele, transfer to progressive, BP q73mn  Her RUE has significantly worsened swelling, suspect hematoma and may be cause of hgb drop. Patient is pain free and pleasantly confused at rest with arm in sling - contacted ortho to reevaluate - ordered arm CT - asa stopped  3/1: 1 unit transfused. Post transfusion hgb 10.3. CT R arm showed significant soft tissue swelling of likely hematoma and ~4cm displaced humeral fracture. ortho reevaluated and patient is non-surgical.  3/2: pain-free at rest.  Hgb 9.1>8.1. I believe she is clear to discharge to SNF once bed available and if hgb remains stable above transfusion threshold. Likely 3/4  Assessment and Plan: [place] Secondary to mechanical fall.  Acute comminuted right femoral intertrochanteric fracture with varus impaction.  S/p ORIF 2/26 PT is recommending SNF. -Continue with pain management -TOC consult for placement - continue PT/OT  Closed fracture of right proximal humerus Secondary to the same fall. S/p close reduction 2/26. CT scan 2/29 showing significant soft tissue swelling consistent with hematoma on exam. Fracture is ~4cm displaced -Continue with pain management - PT/OT - ortho re-evaluated, appreciate your care  - non-surgical.   -  continue sling  Bilateral pubic rami fractures (H[facility]) CT also showed nonacute bilateral pubic rami fractures, nondisplaced and subtle. -Continue with pain management - PT/OT  Macrocytic anemia- received 4 units pRBCs this admission. Hgb 10.3>9.1>8.1 since last transfusion. Significant hematoma to right arm.  Transfusion  threshold <7 or 8 for orthopedic procedure or hemodynamically unstable - repeat CBC am.  - repeat b12 injection weekly  Essential hypertension- Hypotension- hypotension from acute blood loss but she also has arm restriction so pressures have been measured on her lower leg and is likely inaccurate. Clinically she is asymptomatic and presenting as normotensive despite the low readings.  She continues to have strong peripheral radial and pedal pulses even when her recorded systolic blood pressures were in 50s which leads me to believe that her true systolic is at least 80.  She has shown more hemodynamic stability after blood transfusion and IV fluids.  - home Coreg at half dose -home enalapril held currently  CKDIII-  Cr stable at 1.5>1.4>1.1. baseline closer to 1.0 -Continue to monitor  Hyperlipidemia -Continue Zocor  Hypothyroidism -Continue Synthroid  Tobacco abuse -Nicotine patch PRN  Depression with anxiety -Continue home medication: Olanzapine, Lexapro, Wellbutrin      Subjective: patient reports no complaints at rest. No SOB. Complains of my cold hands when examining her.   Physical Exam: Vitals:   05/03/22 2021 05/03/22 2249 05/04/22 1604 05/04/22 2324  BP: (!) 106/55 121/69 112/72 (!) 151/68  Pulse: 99 97 94 82  Resp: '19 18 16 18  '$ Temp: (!) 97.3 F (36.3 C) 98.2 F (36.8 C) (!) 97.5 F (36.4 C) 98.9 F (37.2 C)  TempSrc: Oral     SpO2: 100% 99% 95% 96%  Weight:      Height:       General.  Frail elderly lady, in no acute distress. Pulmonary.  Lungs clear bilaterally, normal respiratory effort. CV.  Regular rate and rhythm, no JVD, rub or murmur. Abdomen.  Soft, nontender, nondistended, BS positive. CNS.  Alert and oriented to self and location.  No focal neurologic deficit. Extremities.  Right upper extremity in sling with significant bruising and edema of arm mildly improved from yesterday, no LE edema.  Right hip with saturated bandages. No active bleeding  from incisions. No signs of infection at surgical sites.  Psychiatry.  Judgment and insight appears impaired  Data Reviewed: CBC    Component Value Date/Time   WBC 10.2 05/05/2022 0650   RBC 2.56 (L) 05/05/2022 0650   HGB 8.1 (L) 05/05/2022 0650   HCT 24.3 (L) 05/05/2022 0650   PLT 150 05/05/2022 0650   MCV 94.9 05/05/2022 0650   MCH 31.6 05/05/2022 0650   MCHC 33.3 05/05/2022 0650   RDW 18.6 (H) 05/05/2022 0650   LYMPHSABS 1.5 05/03/2022 1806   MONOABS 0.5 05/03/2022 1806   EOSABS 0.4 05/03/2022 1806   BASOSABS 0.0 05/03/2022 1806      Latest Ref Rng & Units 05/04/2022    5:35 AM 05/02/2022    6:28 AM 05/01/2022    3:24 AM  BMP  Glucose 70 - 99 mg/dL 78  69  122   BUN 8 - 23 mg/dL 41  42  40   Creatinine 0.44 - 1.00 mg/dL 1.13  1.41  1.50   Sodium 135 - 145 mmol/L 134  134  133   Potassium 3.5 - 5.1 mmol/L 4.1  4.4  3.8   Chloride 98 - 111 mmol/L 107  107  106   CO2 22 -  32 mmol/L '22  23  21   '$ Calcium 8.9 - 10.3 mg/dL 8.6  8.5  8.3    Family Communication: disposition discussion with family and TOC. No medical updates.   Disposition: Status is: Inpatient Remains inpatient appropriate because: awaiting safe dispo plan  Planned Discharge Destination: Skilled nursing facility as early as 3/4 with stable hgb and ortho clearance  Time spent: 35 minutes   Author: Richarda Osmond, MD 05/05/2022 7:49 AM  For on call review [email or web address]

## 2022-05-05 NOTE — Consult Note (Signed)
ANTICOAGULATION CONSULT NOTE  Pharmacy Consult for Heparin Indication: VTE prophylaxis  Patient Measurements: Height: '5\' 4"'$  (162.6 cm) Weight: 56 kg (123 lb 7.3 oz) IBW/kg (Calculated) : 54.7  Labs: Recent Labs    05/03/22 1806 05/04/22 0535 05/05/22 0650  HGB 10.3* 9.1* 8.1*  HCT 31.0* 26.9* 24.3*  PLT 146* 138* 150  CREATININE  --  1.13*  --     Estimated Creatinine Clearance: 32 mL/min (A) (by C-G formula based on SCr of 1.13 mg/dL (H)).   Medical History: Past Medical History:  Diagnosis Date   High cholesterol    Hypertension    Thyroid disease    Medications:  No anticoagulation prior to admission per my chart review  Assessment: 87 y/o F with medical history including HTN, HLD, depression, CKD, anemia, AAA, HOH, early stage dementia, hypothyroidism who is admitted with fall complicated by right shoulder and right hip pain. Diagnosed with closed right hip fracture, closed fracture of right proximal humerus, and bilateral pubic rami fractures. She is status post right intertrochanteric IM nailing and closed reduction right shoulder on 2/26. Pharmacy consulted for heparin dosing for VTE prophylaxis.  Plan:  --Heparin 5000 units [place] q8h  Benita Gutter 05/05/2022,7:59 AM

## 2022-05-05 NOTE — Plan of Care (Signed)

## 2022-05-06 LAB — CBC
HCT: 22.5 % — ABNORMAL LOW (ref 36.0–46.0)
Hemoglobin: 7.3 g/dL — ABNORMAL LOW (ref 12.0–15.0)
MCH: 31.5 pg (ref 26.0–34.0)
MCHC: 32.4 g/dL (ref 30.0–36.0)
MCV: 97 fL (ref 80.0–100.0)
Platelets: 161 10*3/uL (ref 150–400)
RBC: 2.32 MIL/uL — ABNORMAL LOW (ref 3.87–5.11)
RDW: 18.6 % — ABNORMAL HIGH (ref 11.5–15.5)
WBC: 9.9 10*3/uL (ref 4.0–10.5)
nRBC: 0 % (ref 0.0–0.2)

## 2022-05-06 LAB — HEMOGLOBIN AND HEMATOCRIT, BLOOD
HCT: 29 % — ABNORMAL LOW (ref 36.0–46.0)
Hemoglobin: 9.6 g/dL — ABNORMAL LOW (ref 12.0–15.0)

## 2022-05-06 LAB — PREPARE RBC (CROSSMATCH)

## 2022-05-06 MED ORDER — SODIUM CHLORIDE 0.9% IV SOLUTION: Freq: Once | INTRAVENOUS | Status: AC

## 2022-05-06 MED ORDER — CHLORHEXIDINE GLUCONATE CLOTH 2 % EX PADS
6.0000 | MEDICATED_PAD | Freq: Every day | CUTANEOUS | Status: DC
  Administered 2022-05-06 – 2022-05-07 (×2): 6 via TOPICAL

## 2022-05-06 NOTE — Care Management Important Message (Signed)
Important Message  Patient Details  Name: Bianca Horton MRN: [id number] Date of Birth: March 31, 1935   Medicare Important Message Given:  Yes     Loann Quill 05/06/2022, 3:43 PM

## 2022-05-06 NOTE — TOC Progression Note (Signed)
Transition of Care [facility]) - Progression Note    Patient Details  Name: IXEL GIGER MRN: [id number] Date of Birth: 1935/06/29  Transition of Care [facility]) CM/SW [place], RN Phone Number: 05/06/2022, 8:30 AM  Clinical Narrative:    Reached out to [facility] to0 confirm that the patient will be able to come today, they confirmed yes   Expected Discharge Plan: Long Term Nursing Home Barriers to Discharge: No Barriers Identified  Expected Discharge Plan and Services   Discharge Planning Services: CM Consult   Living arrangements for the past 2 months: [place]                                       Social Determinants of Health (SDOH) Interventions SDOH Screenings   Food Insecurity: No Food Insecurity (04/28/2022)  Housing: Low Risk  (04/28/2022)  Transportation Needs: No Transportation Needs (04/28/2022)  Utilities: Not At Risk (04/28/2022)  Tobacco Use: High Risk (04/30/2022)    Readmission Risk Interventions     No data to display

## 2022-05-06 NOTE — Progress Notes (Signed)
OT Cancellation Note  Patient Details Name: MONIE STROZEWSKI MRN: [id number] DOB: 05/23/1935   Cancelled Treatment:    Reason Eval/Treat Not Completed: Pain limiting ability to participate  Patient declined OT treatment this morning due to pain.  OT notified RN of patient's pain, will continue to follow up at next opportunity.  Jeneen Montgomery, OTR/L 05/06/22, 9:35 AM

## 2022-05-06 NOTE — Progress Notes (Signed)
Physical Therapy Treatment Patient Details Name: Bianca Horton MRN: [id number] DOB: 01-23-1936 Today's Date: 05/06/2022   History of Present Illness Pt admitted for fall at [place] with R shoulder fx and R hip fracture. Pt is POD 1 s/p R hip IM nailing and R shoulder closed reduction. History includes HTN, HLD, depression, anemia, and falls in past. Pt pending blood transfusion this date secondary to low Hgb.    PT Comments    Pt is making limited progress towards goals and continues to be limited by pain/confusion. Pt able to roll to B side for pericare secondary to incontinent BM. Pt then assist to dangle at EOB, however unable to tolerate >5 seconds. +2 for all mobility. Will continue to progress.   Recommendations for follow up therapy are one component of a multi-disciplinary discharge planning process, led by the attending physician.  Recommendations may be updated based on patient status, additional functional criteria and insurance authorization.  Follow Up Recommendations  Skilled nursing-short term rehab (<3 hours/day) Can patient physically be transported by private vehicle: No   Assistance Recommended at Discharge Frequent or constant Supervision/Assistance  Patient can return home with the following Two people to help with walking and/or transfers;Two people to help with bathing/dressing/bathroom   Equipment Recommendations   (TBD)    Recommendations for Other Services       Precautions / Restrictions Precautions Precautions: Fall Restrictions Weight Bearing Restrictions: Yes RUE Weight Bearing: Non weight bearing LUE Weight Bearing: Weight bearing as tolerated RLE Weight Bearing: Weight bearing as tolerated LLE Weight Bearing: Weight bearing as tolerated     Mobility  Bed Mobility Overal bed mobility: Needs Assistance Bed Mobility: Supine to Sit     Supine to sit: Total assist, +2 for physical assistance     General bed mobility comments: total assist for bed  mobility of 1 to EOB and then +2 for returning back to bed and scooting up towards HOB. Pt unable to sit greater than 5 seconds- complains of pain    Transfers                   General transfer comment: unable    Ambulation/Gait                   Stairs             Wheelchair Mobility    Modified Rankin (Stroke Patients Only)       Balance Overall balance assessment: History of Falls, Needs assistance Sitting-balance support: Single extremity supported Sitting balance-Leahy Scale: Poor                                      Cognition Arousal/Alertness: Awake/alert Behavior During Therapy: WFL for tasks assessed/performed Overall Cognitive Status: History of cognitive impairments - at baseline                                 General Comments: very HOH and confused to situation/location. Asking to go home        Exercises Other Exercises Other Exercises: supine ther-ex including R LE hip abd/add and SLR. All x 5 reps with total assist. Other Exercises: noted pt soiled with incontinent BM while in bed. Assisted in rolling with +2 assist and total care hygiene performed. Pt able to roll to B sides with max assist  General Comments        Pertinent Vitals/Pain Pain Assessment Pain Assessment: Faces Faces Pain Scale: Hurts even more Pain Location: RLE/RUE Pain Descriptors / Indicators: Operative site guarding, Grimacing, Guarding Pain Intervention(s): Limited activity within patient's tolerance, Repositioned    Home Living                          Prior Function            PT Goals (current goals can now be found in the care plan section) Acute Rehab PT Goals Patient Stated Goal: to have less pain PT Goal Formulation: With patient Time For Goal Achievement: 05/14/22 Potential to Achieve Goals: Poor Progress towards PT goals: Not progressing toward goals - comment    Frequency    Min  2X/week      PT Plan Current plan remains appropriate    Co-evaluation              AM-PAC PT "6 Clicks" Mobility   Outcome Measure  Help needed turning from your back to your side while in a flat bed without using bedrails?: Total Help needed moving from lying on your back to sitting on the side of a flat bed without using bedrails?: Total Help needed moving to and from a bed to a chair (including a wheelchair)?: Total Help needed standing up from a chair using your arms (e.g., wheelchair or bedside chair)?: Total Help needed to walk in hospital room?: Total Help needed climbing 3-5 steps with a railing? : Total 6 Click Score: 6    End of Session   Activity Tolerance: Patient limited by pain;Patient limited by fatigue Patient left: in bed;with call bell/phone within reach;with bed alarm set Nurse Communication: Mobility status PT Visit Diagnosis: Muscle weakness (generalized) (M62.81);Difficulty in walking, not elsewhere classified (R26.2);Pain;History of falling (Z91.81) Pain - Right/Left: Right Pain - part of body: Hip     Time: [id number] PT Time Calculation (min) (ACUTE ONLY): 26 min  Charges:  $Therapeutic Activity: 23-37 mins                     Greggory Stallion, PT, DPT, GCS [phone]    Vin Yonke 05/06/2022, 11:22 AM

## 2022-05-06 NOTE — Progress Notes (Signed)
Progress Note   Patient: Bianca Horton [id number] DOB: October 13, 1935 DOA: 04/28/2022     8 DOS: the patient was seen and examined on 05/06/2022   Brief hospital course: Bianca Horton is a 87 y.o. female with medical history significant of HTN, HLD, depression, CKD-3b, anemia, AAA, HOH, early stage of dementia, hypothyroidism.  They presented with fall, pain right shoulder and right hip. Patient has ambulatory dysfunction at baseline, and is bed bound. On day of presentation, she attempted to get out of bed and tried to walk, falling over and landing on her right side. No LOC.    ED Course: WBC 11.2, hemoglobin 7.0 (7.8 on 01/28/2022), stable renal function.  Chest x-ray negative.  CT of C-spine negative. CT of head is negative for acute intracranial abnormalities, but showed mild right posterior parietal scalp hematoma. X-ray showed right shoulder with displaced right proximal humeral fracture and x-ray of right hip with comminuted right intertrochanteric fracture.   Orthopedic was consulted and patient taken to OR for nailing of right hip and ORIF of right shoulder.   2/26: Vital stable.  Hemoglobin at 7.7, it showed improvement to 9.2 after getting 1 unit of PRBC.  Ordered 1 more unit as she is going for surgery today. Patient is going to the OR today.   2/27: Vitals stable with low normal blood pressure, s/p closed reduction left shoulder and ORIF of right hip.  Hemoglobin decreased to 6.9, anemia panel consistent with anemia of chronic disease and B12 deficiency.  Ordered 1 unit of PRBC and she will continue on supplements.  She will be weightbearing as tolerated on right lower extremity and right upper extremity will remain in sling.  AKI today with creatinine bumped to 1.50.  Getting some gentle IV fluid.  PICC line was placed after multiple failed attempts.  Need to be removed before discharge.   PT is recommending SNF, patient is a long-term resident at [facility].  2/29: UPDATE  afternoon: notified by nursing staff of BP of [id number] systolics. Because patient is alert and asymptomatic, question if our reads are accurate on her leg. Both arms have restrictions for taking BP though.  - treat with STAT fluid bolus - type and screen and ordered 1 unit RBC for hgb decline and continued bleeding to support hemodynamic stability.  - continue tele, transfer to progressive, BP q61mn  Her RUE has significantly worsened swelling, suspect hematoma and may be cause of hgb drop. Patient is pain free and pleasantly confused at rest with arm in sling - contacted ortho to reevaluate - ordered arm CT - asa stopped  3/1: 1 unit transfused. Post transfusion hgb 10.3. CT R arm showed significant soft tissue swelling of likely hematoma and ~4cm displaced humeral fracture. ortho reevaluated and patient is non-surgical.  3/2-3/3: pain-free at rest.  Hgb 9.1>8.1. I believe she is clear to discharge to SNF once bed available and if hgb remains stable above transfusion threshold.  3/4: unfortunately, her hgb dropped below threshold again. There is some oozing at surgical incision on leg, hematomas appear stable. Will transfuse 1unit pRBCs today.   Assessment and Plan: Closed right [place] fracture [place] Secondary to mechanical fall.  Acute comminuted right femoral intertrochanteric fracture with varus impaction.  S/p ORIF 2/26. PT is recommending SNF. -Continue with pain management -TOC consult for placement - continue PT/OT  Closed fracture of right proximal humerus Secondary to the same fall. S/p close reduction 2/26. CT scan 2/29 showing significant soft tissue swelling consistent  with hematoma on exam. Fracture is ~4cm displaced. Exam is stable.  -Continue with pain management - PT/OT - ortho re-evaluated, appreciate your care  - non-surgical.   - continue sling  Bilateral pubic rami fractures (HCC) CT also showed nonacute bilateral pubic rami fractures, nondisplaced and  subtle. -Continue with pain management - PT/OT  Macrocytic anemia- received 4 units pRBCs this admission. Hgb 10.3>9.1>8.1>7.3 since last transfusion. Transfusing 1 unit today 3/4. Hemodynamically stable today. Significant hematoma to right arm appears stable.  Transfusion threshold <7 or 8 for orthopedic procedure or hemodynamically unstable - repeat CBC am.  - repeat b12 injection weekly  Essential hypertension- Hypotension- hypotension from acute blood loss but she also has arm restriction so pressures have been measured on her lower leg and is likely inaccurate. Clinically she is asymptomatic and presenting as normotensive despite the low readings.  She continues to have strong peripheral radial and pedal pulses even when her recorded systolic blood pressures were in 50s which leads me to believe that her true systolic is at least 80.  She has shown more hemodynamic stability after blood transfusion and IV fluids.  - home Coreg at half dose -home enalapril held currently  CKDIII-  Cr stable at 1.5>1.4>1.1. baseline closer to 1.0 -Continue to monitor  Hyperlipidemia -Continue Zocor  Hypothyroidism -Continue Synthroid  Tobacco abuse -Nicotine patch PRN  Depression with anxiety -Continue home medication: Olanzapine, Lexapro, Wellbutrin      Subjective: patient reports being hungry. She has some pain in right shoulder. Just had BM. Denies further concerns.   Physical Exam: Vitals:   05/04/22 2324 05/05/22 0817 05/05/22 1528 05/05/22 2257  BP: (!) 151/68 129/89 106/63 (!) 103/53  Pulse: 82 93 78 88  Resp: '18 16 16   '$ Temp: 98.9 F (37.2 C) 98.6 F (37 C) 97.9 F (36.6 C) 98.6 F (37 C)  TempSrc:      SpO2: 96% 95% 100% 98%  Weight:      Height:       General.  Frail elderly lady, in no acute distress. Pulmonary.  Lungs clear bilaterally, normal respiratory effort. CV.  Regular rate and rhythm, no JVD, rub or murmur. Abdomen.  Soft, nontender, nondistended CNS.   Alert and oriented to self and location.  No focal neurologic deficit. Extremities.  Right upper extremity in sling with significant bruising and edema of arm- stable, no LE edema.  Right hip with saturated bandages.No signs of infection at surgical sites.  Psychiatry.  Judgment and insight appears impaired  Data Reviewed: CBC    Component Value Date/Time   WBC 10.2 05/05/2022 0650   RBC 2.56 (L) 05/05/2022 0650   HGB 8.1 (L) 05/05/2022 0650   HCT 24.3 (L) 05/05/2022 0650   PLT 150 05/05/2022 0650   MCV 94.9 05/05/2022 0650   MCH 31.6 05/05/2022 0650   MCHC 33.3 05/05/2022 0650   RDW 18.6 (H) 05/05/2022 0650   LYMPHSABS 1.5 05/03/2022 1806   MONOABS 0.5 05/03/2022 1806   EOSABS 0.4 05/03/2022 1806   BASOSABS 0.0 05/03/2022 1806      Latest Ref Rng & Units 05/04/2022    5:35 AM 05/02/2022    6:28 AM 05/01/2022    3:24 AM  BMP  Glucose 70 - 99 mg/dL 78  69  122   BUN 8 - 23 mg/dL 41  42  40   Creatinine 0.44 - 1.00 mg/dL 1.13  1.41  1.50   Sodium 135 - 145 mmol/L 134  134  133  Potassium 3.5 - 5.1 mmol/L 4.1  4.4  3.8   Chloride 98 - 111 mmol/L 107  107  106   CO2 22 - 32 mmol/L '22  23  21   '$ Calcium 8.9 - 10.3 mg/dL 8.6  8.5  8.3    Family Communication: disposition discussion with family and TOC. No medical updates.   Disposition: Status is: Inpatient Remains inpatient appropriate because: awaiting safe dispo plan  Planned Discharge Destination: Skilled nursing facility as early as 3/4 with stable hgb and ortho clearance  Time spent: 35 minutes   Author: Richarda Osmond, MD 05/06/2022 8:27 AM  For on call review [email or web address].

## 2022-05-06 NOTE — Progress Notes (Signed)
Pt with saturated serosanguinous to incision sites. Removed old dressing and applied honeycomb per attending MD verbal order this morning.  Notified attending MD and Ortho surgeon. Per attending MD, pt has been oozing daily and the dressing had to be changed daily. Per attending MD, apply pressure the sites. Per surgeon, apply pressure dressing with gauze, abd pads, and tape.

## 2022-05-07 DIAGNOSIS — N1832 Chronic kidney disease, stage 3b: Secondary | ICD-10-CM | POA: Diagnosis not present

## 2022-05-07 DIAGNOSIS — I129 Hypertensive chronic kidney disease with stage 1 through stage 4 chronic kidney disease, or unspecified chronic kidney disease: Secondary | ICD-10-CM | POA: Diagnosis not present

## 2022-05-07 DIAGNOSIS — D508 Other iron deficiency anemias: Secondary | ICD-10-CM | POA: Diagnosis not present

## 2022-05-07 DIAGNOSIS — Z720 Tobacco use: Secondary | ICD-10-CM | POA: Diagnosis not present

## 2022-05-07 DIAGNOSIS — S72141D Displaced intertrochanteric fracture of right femur, subsequent encounter for closed fracture with routine healing: Secondary | ICD-10-CM | POA: Diagnosis not present

## 2022-05-07 DIAGNOSIS — K5900 Constipation, unspecified: Secondary | ICD-10-CM | POA: Diagnosis not present

## 2022-05-07 DIAGNOSIS — R296 Repeated falls: Secondary | ICD-10-CM | POA: Diagnosis not present

## 2022-05-07 DIAGNOSIS — Z8616 Personal history of COVID-19: Secondary | ICD-10-CM | POA: Diagnosis not present

## 2022-05-07 DIAGNOSIS — F03A18 Unspecified dementia, mild, with other behavioral disturbance: Secondary | ICD-10-CM | POA: Diagnosis not present

## 2022-05-07 DIAGNOSIS — K219 Gastro-esophageal reflux disease without esophagitis: Secondary | ICD-10-CM | POA: Diagnosis not present

## 2022-05-07 DIAGNOSIS — W19XXXD Unspecified fall, subsequent encounter: Secondary | ICD-10-CM | POA: Diagnosis not present

## 2022-05-07 DIAGNOSIS — E039 Hypothyroidism, unspecified: Secondary | ICD-10-CM | POA: Diagnosis not present

## 2022-05-07 DIAGNOSIS — R6889 Other general symptoms and signs: Secondary | ICD-10-CM | POA: Diagnosis not present

## 2022-05-07 DIAGNOSIS — E7800 Pure hypercholesterolemia, unspecified: Secondary | ICD-10-CM | POA: Diagnosis not present

## 2022-05-07 DIAGNOSIS — S7291XD Unspecified fracture of right femur, subsequent encounter for closed fracture with routine healing: Secondary | ICD-10-CM | POA: Diagnosis not present

## 2022-05-07 DIAGNOSIS — S42211A Unspecified displaced fracture of surgical neck of right humerus, initial encounter for closed fracture: Secondary | ICD-10-CM | POA: Diagnosis not present

## 2022-05-07 DIAGNOSIS — I10 Essential (primary) hypertension: Secondary | ICD-10-CM | POA: Diagnosis not present

## 2022-05-07 DIAGNOSIS — S72141A Displaced intertrochanteric fracture of right femur, initial encounter for closed fracture: Secondary | ICD-10-CM | POA: Diagnosis not present

## 2022-05-07 DIAGNOSIS — Z7982 Long term (current) use of aspirin: Secondary | ICD-10-CM | POA: Diagnosis not present

## 2022-05-07 DIAGNOSIS — S42211D Unspecified displaced fracture of surgical neck of right humerus, subsequent encounter for fracture with routine healing: Secondary | ICD-10-CM | POA: Diagnosis not present

## 2022-05-07 DIAGNOSIS — S32591D Other specified fracture of right pubis, subsequent encounter for fracture with routine healing: Secondary | ICD-10-CM | POA: Diagnosis not present

## 2022-05-07 DIAGNOSIS — D631 Anemia in chronic kidney disease: Secondary | ICD-10-CM | POA: Diagnosis not present

## 2022-05-07 DIAGNOSIS — Z7401 Bed confinement status: Secondary | ICD-10-CM | POA: Diagnosis not present

## 2022-05-07 DIAGNOSIS — E038 Other specified hypothyroidism: Secondary | ICD-10-CM | POA: Diagnosis not present

## 2022-05-07 DIAGNOSIS — D62 Acute posthemorrhagic anemia: Secondary | ICD-10-CM | POA: Diagnosis not present

## 2022-05-07 DIAGNOSIS — I714 Abdominal aortic aneurysm, without rupture, unspecified: Secondary | ICD-10-CM | POA: Diagnosis not present

## 2022-05-07 DIAGNOSIS — S32519D Fracture of superior rim of unspecified pubis, subsequent encounter for fracture with routine healing: Secondary | ICD-10-CM | POA: Diagnosis not present

## 2022-05-07 DIAGNOSIS — S42201D Unspecified fracture of upper end of right humerus, subsequent encounter for fracture with routine healing: Secondary | ICD-10-CM | POA: Diagnosis not present

## 2022-05-07 DIAGNOSIS — Z743 Need for continuous supervision: Secondary | ICD-10-CM | POA: Diagnosis not present

## 2022-05-07 DIAGNOSIS — M6281 Muscle weakness (generalized): Secondary | ICD-10-CM | POA: Diagnosis not present

## 2022-05-07 DIAGNOSIS — K5904 Chronic idiopathic constipation: Secondary | ICD-10-CM | POA: Diagnosis not present

## 2022-05-07 DIAGNOSIS — D649 Anemia, unspecified: Secondary | ICD-10-CM | POA: Diagnosis not present

## 2022-05-07 DIAGNOSIS — E440 Moderate protein-calorie malnutrition: Secondary | ICD-10-CM | POA: Diagnosis not present

## 2022-05-07 DIAGNOSIS — S72001A Fracture of unspecified part of neck of right femur, initial encounter for closed fracture: Secondary | ICD-10-CM | POA: Diagnosis not present

## 2022-05-07 DIAGNOSIS — N183 Chronic kidney disease, stage 3 unspecified: Secondary | ICD-10-CM | POA: Diagnosis not present

## 2022-05-07 DIAGNOSIS — S32592D Other specified fracture of left pubis, subsequent encounter for fracture with routine healing: Secondary | ICD-10-CM | POA: Diagnosis not present

## 2022-05-07 LAB — TYPE AND SCREEN
ABO/RH(D): A POS
Antibody Screen: NEGATIVE
Unit division: 0

## 2022-05-07 LAB — BPAM RBC
Blood Product Expiration Date: 202403132359
ISSUE DATE / TIME: 202403041519
Unit Type and Rh: 6200

## 2022-05-07 LAB — CBC
HCT: 27.9 % — ABNORMAL LOW (ref 36.0–46.0)
Hemoglobin: 9.2 g/dL — ABNORMAL LOW (ref 12.0–15.0)
MCH: 31.1 pg (ref 26.0–34.0)
MCHC: 33 g/dL (ref 30.0–36.0)
MCV: 94.3 fL (ref 80.0–100.0)
Platelets: 171 10*3/uL (ref 150–400)
RBC: 2.96 MIL/uL — ABNORMAL LOW (ref 3.87–5.11)
RDW: 18.3 % — ABNORMAL HIGH (ref 11.5–15.5)
WBC: 10.8 10*3/uL — ABNORMAL HIGH (ref 4.0–10.5)
nRBC: 0 % (ref 0.0–0.2)

## 2022-05-07 LAB — BASIC METABOLIC PANEL
Anion gap: 6 (ref 5–15)
BUN: 41 mg/dL — ABNORMAL HIGH (ref 8–23)
CO2: 23 mmol/L (ref 22–32)
Calcium: 9 mg/dL (ref 8.9–10.3)
Chloride: 105 mmol/L (ref 98–111)
Creatinine, Ser: 1.08 mg/dL — ABNORMAL HIGH (ref 0.44–1.00)
GFR, Estimated: 51 mL/min — ABNORMAL LOW (ref >60)
Glucose, Bld: 87 mg/dL (ref 70–99)
Potassium: 4.6 mmol/L (ref 3.5–5.1)
Sodium: 134 mmol/L — ABNORMAL LOW (ref 135–145)

## 2022-05-07 MED ORDER — CARVEDILOL 12.5 MG PO TABS: 12.5000 mg | ORAL_TABLET | Freq: Two times a day (BID) | ORAL | Status: AC

## 2022-05-07 MED ORDER — FERROUS SULFATE 325 (65 FE) MG PO TABS: 325.0000 mg | ORAL_TABLET | Freq: Every day | ORAL | 0 refills | Status: AC

## 2022-05-07 NOTE — Progress Notes (Signed)
Copious amount of serious drainage from surgical sites. Dressing changed during handoff from night nurse. Continued with plan of care per MD.

## 2022-05-07 NOTE — Plan of Care (Signed)

## 2022-05-07 NOTE — Consult Note (Signed)
   [facility] Inpatient Consult   05/07/2022  Bianca Horton 1935-09-30 [id number]   Orientation with Bianca Horton, [facility] Liaison for review.   [facility]: [place] [facility]   [facility]) [place] Patient: Insurance [facility]))    Primary Care Provider: Perrin Maltese, MD With [facility]  Patient screened for less than 30 days readmission hospitalization with noted high risk score for unplanned readmission.  Plan:  Pt will d/c to SNF however will return to LTC level of care after rehab. [facility] PAC RN made aware of this plan of care.   [place] does not replace or interfere with any arrangements made by the Inpatient Transition of Care team.   For questions contact:   Bianca Mina, RN, [place] Hours M-F 8:00 am to 4:30 pm [phone] [Office toll free line][facility] Office Hours are M-F 8:30 - 5 pm 24 hour nurse advise line [phone] Conceirge  [email or web address]

## 2022-05-07 NOTE — Progress Notes (Signed)
Contacted [facility] and reported to Geologist, engineering. Picc line removed and intact. Pt transported from [facility] to Facility via EMS.

## 2022-05-07 NOTE — TOC Progression Note (Signed)
Transition of Care [facility]) - Progression Note    Patient Details  Name: Bianca Horton MRN: [id number] Date of Birth: April 25, 1935  Transition of Care [facility]) CM/SW [place], RN Phone Number: 05/07/2022, 12:34 PM  Clinical Narrative:   Patient is stable for discharge today.  I have spoken with Tiffany, Admissions Coordinator at [facility].  She reports that she will have bed for patient today.  Patient will be going to room 102 B.  Tiffany informs me that the staff nurse will need to call report to [phone].  I have informed patients' POA Vivien Rota that patient will be discharging to [facility] today. I have informed Vivien Rota that patient's room number at [facility] is 102 B and patient will be transporting to [facility] by EMS today.    EMS arranged.  I have informed staff nurse of the room patient will be going to at [facility].  I have informed the staff nurse also to call report to [phone].  I have faxed SNF transfer Summary, Discharge orders, and Discharge Summary to [facility]      Expected Discharge Plan: Long Term Nursing Home Barriers to Discharge: No Barriers Identified  Expected Discharge Plan and Services   Discharge Planning Services: CM Consult   Living arrangements for the past 2 months: [place] Expected Discharge Date: 05/07/22                                     Social Determinants of Health (SDOH) Interventions SDOH Screenings   Food Insecurity: No Food Insecurity (04/28/2022)  Housing: Low Risk  (04/28/2022)  Transportation Needs: No Transportation Needs (04/28/2022)  Utilities: Not At Risk (04/28/2022)  Tobacco Use: High Risk (04/30/2022)    Readmission Risk Interventions     No data to display

## 2022-05-07 NOTE — Discharge Summary (Signed)
Physician Discharge Summary  Patient: Bianca Horton [id number] DOB: 1935/03/08   Code Status: DNR Admit date: 04/28/2022 Discharge date: 05/07/2022 Disposition: Skilled nursing facility, PT, OT, nurse aid, RN, and wound care PCP: Perrin Maltese, MD  Recommendations for Outpatient Follow-up:  Follow up with PCP within 1 week Regarding general hospital follow up and preventative care Recommend CBC to monitor hgb for blood loss following surgery Follow up with ortho within 2 weeks Monitor wound healing  Discharge Diagnoses:  Principal Problem:   Closed right hip fracture [facility]) Active Problems:   Closed fracture of right proximal humerus   Bilateral pubic rami fractures ([place])   Essential hypertension   Stage 3b chronic kidney disease (CKD) ([place])   Fall at home, initial encounter   Hyperlipidemia   Hypothyroidism   Macrocytic anemia   Tobacco abuse   Depression with anxiety   Closed fracture of neck of right humerus   Dementia without behavioral disturbance (HCC)   Malnutrition of moderate degree   Hypotension due to hypovolemia   Acute postoperative anemia due to expected blood loss  Brief Hospital Course Summary: Bianca Horton is a 87 y.o. female with medical history significant of HTN, HLD, depression, CKD-3b, anemia, AAA, HOH, early stage of dementia, hypothyroidism.   They presented 2/25 with fall, pain right shoulder and right hip. Patient has ambulatory dysfunction at baseline, and is bed bound. On day of presentation, she attempted to get out of bed and tried to walk, falling over and landing on her right side. No LOC.    ED Course: WBC 11.2, hemoglobin 7.0 (7.8 on 01/28/2022), stable renal function.  Chest x-ray negative.  CT of C-spine negative. CT of head is negative for acute intracranial abnormalities, but showed mild right posterior parietal scalp hematoma. X-ray showed right shoulder with displaced right proximal humeral fracture and x-ray of right hip with  comminuted right intertrochanteric fracture.   Orthopedic surgery was consulted and patient taken to OR for nailing of right hip and closed reduction of right shoulder fracture 2/26. She received 2 units of pRBCs prior to surgery for anemia.  Patient appeared to tolerate the procedure well and began working with PT/OT for recovery.   Patient's post-op recovery was complicated by hematoma at right humerus dislocated fracture and anemia/hypovolemia.  She received a total of 5 units pRBCs throughout her stay including pre-and post-op transfusions. Her home blood pressure medications were also reduced/decreased in an effort to treat her transient hypotension (as listed below).  Her vital signs stabilized within normal limits for several days prior to dc. Fortunately, she remained asymptomatic throughout her period of hypotension post-op. I think that since her BP was being monitored on her shin instead of her arms (due to a PICC line in one arm and a fracture on the other), there were falsely low readings.   Her R arm continues to have a significant hematoma. Re-imaging of it (CT scan 2/29) revealed continued moderately displaced humeral fracture. Ortho surgery evaluated and did not recommend further intervention. She received PT/OT therapy, arm sling and analgesia.  The R thigh incisions from her surgery have continued to have slow oozing of serosanguinous fluid. She required daily bandage changes. There is no overt signs of infection at incision sites. A pressure dressing was applied with abdominal pad and ace bandage which improved the oozing.  Ortho Surgery recommended outpatient follow up.    On day of discharge, her hgb is stable from 9.6 yesterday, to 9.2 today.  Her injuries have stable appearance. She is pain free at rest.  She is in her normal state of mental status which is moderate dementia and hard of hearing. She is able to follow commands and express her needs.  Would recommend a repeat CBC  prior to the weeks end to monitor.   Discharge Condition: Stable, improved Recommended discharge diet: Regular healthy diet  Consultations: Ortho surgery   Procedures/Studies: closed reduction of right humeral fracture Right hip fracture fixation  Allergies as of 05/07/2022       Reactions   Clopidogrel    Other reaction(s): brusing skin   Sertraline Hcl Nausea And Vomiting   Other reaction(s): Nausea, Vomiting, Diarrhea, sleepwalking, Nausea, Vomiting, Diarrhea, sleepwalking        Medication List     STOP taking these medications    aspirin 81 MG chewable tablet   chlorhexidine 0.12 % solution Commonly known as: PERIDEX   enalapril 2.5 MG tablet Commonly known as: VASOTEC   oxyCODONE-acetaminophen 5-325 MG tablet Commonly known as: PERCOCET/ROXICET   risperiDONE 0.25 MG tablet Commonly known as: RISPERDAL   senna-docusate 8.6-50 MG tablet Commonly known as: Senokot-S   simvastatin 40 MG tablet Commonly known as: ZOCOR       TAKE these medications    acetaminophen 500 MG tablet Commonly known as: TYLENOL Take 1,000 mg by mouth 3 (three) times daily as needed for mild pain.   buPROPion 150 MG 24 hr tablet Commonly known as: WELLBUTRIN XL Take 150 mg by mouth daily.   calcium carbonate 500 MG chewable tablet Commonly known as: TUMS - dosed in mg elemental calcium Chew 1 tablet by mouth 2 (two) times daily as needed for indigestion or heartburn.   carvedilol 12.5 MG tablet Commonly known as: COREG Take 1 tablet (12.5 mg total) by mouth 2 (two) times daily with a meal. What changed:  medication strength how much to take   cilostazol 100 MG tablet Commonly known as: PLETAL Take 100 mg by mouth 2 (two) times daily.   escitalopram 10 MG tablet Commonly known as: LEXAPRO Take 10 mg by mouth daily.   ferrous sulfate 325 (65 FE) MG tablet Take 1 tablet (325 mg total) by mouth daily with breakfast. Start taking on: March 6, 123456   folic acid 1  MG tablet Commonly known as: FOLVITE Take 1 mg by mouth daily.   HYDROcodone-acetaminophen 5-325 MG tablet Commonly known as: NORCO/VICODIN Take 1 tablet by mouth every 4 (four) hours as needed for moderate pain.   ICY HOT ADVANCED RELIEF EX Apply 1 Application topically as needed.   levothyroxine 125 MCG tablet Commonly known as: SYNTHROID Take 125 mcg by mouth every morning.   OLANZapine 2.5 MG tablet Commonly known as: ZYPREXA Take 2.5 mg by mouth at bedtime.   polyethylene glycol 17 g packet Commonly known as: MiraLax Take 17 g by mouth daily.        Contact information for after-discharge care     [facility] .   Service: Skilled Nursing Contact information: [address] [place] [phone]                     Subjective   Pt reports feeling well today. She denies any pain or complaints at rest. She is hungry for breakfast. She reports that she is happy to be discharged today.   All  questions and concerns were addressed at time of discharge.  Objective  Blood pressure 115/73, pulse 86, temperature 98.2 F (36.8 C), resp. rate 18, height '5\' 4"'$  (1.626 m), weight 56 kg, SpO2 100 %.   General: Pt is alert, awake, not in acute distress Cardiovascular: RRR, S1/S2 +, no rubs, no gallops Respiratory: CTA bilaterally, no wheezing, no rhonchi Abdominal: Soft, NT, ND, bowel sounds + Extremities: R shoulder- significant swelling of shoulder and down to mid forearm which is unchanged from daily exams. Distal sensation, ROM, and strength is intact.  R thigh- surgical incisions negative for signs of infection. No active drainage. Pressure dressing applied. Distal sensation, circulation intact  The results of significant diagnostics from this hospitalization (including imaging, microbiology, ancillary and laboratory) are listed  below for reference.   Imaging studies: CT HUMERUS RIGHT WO CONTRAST  Result Date: 05/02/2022 CLINICAL DATA:  Prominently displaced and mildly comminuted Neer 2 part fracture of the proximal humerus, status post close reduction EXAM: CT OF THE RIGHT HUMERUS WITHOUT CONTRAST TECHNIQUE: Multidetector CT imaging was performed according to the standard protocol. Multiplanar CT image reconstructions were also generated. RADIATION DOSE REDUCTION: This exam was performed according to the departmental dose-optimization program which includes automated exposure control, adjustment of the mA and/or kV according to patient size and/or use of iterative reconstruction technique. COMPARISON:  Shoulder CT 04/28/2022 FINDINGS: Bones/Joint/Cartilage Mildly comminuted Neer 2 part surgical neck fracture of the humerus is again observed. Relatively nondisplaced fracture is present along the greater tuberosity, not qualifying as a separate "part" in the Neer classification system. However, the surgical neck fracture is substantially displaced with the dominant shaft fragment anteriorly displaced by 4 cm compared to the dominant humeral head fragment, and with about 1.4 cm overlap. The humeral head fragment is abducted and rotated such that the greater tuberosity is oriented posterolaterally rather than laterally. Although the humeral head fragment articulates with the glenoid, it is mildly subluxed posteriorly and inferiorly with respect to the glenoid. There several small cortical fragments in between the humeral head and dominant shaft fragment. Compared to previous the apex anterior angulation is improved but the degree of displacement is roughly similar. No fracture of the visualize portions of the scapula or clavicle identified. The remainder the humerus appears intact. Ligaments Suboptimally assessed by CT. Muscles and Tendons As expected, there is edema along along fascia planes in the vicinity of the displaced fracture.  This includes the rotator mild cuff musculature, deltoid, biceps, and lateral pectoralis musculature. Edema tracks along the margins of the biceps and triceps musculature along the upper arm. However, I do not see a large intramuscular hematoma. Soft tissues Diffuse regional subcutaneous edema along the shoulder and upper arm. No drainable abscess or large subcutaneous hematoma. Atheromatous calcification in the right subclavian and right axillary arteries. Small right pleural effusion. There is likely a small elbow joint effusion without a visible fracture. IMPRESSION: 1. Mildly comminuted Neer 2 part surgical neck fracture of the humerus. The surgical neck fracture is substantially displaced with 4 cm anterior displacement of the dominant shaft fragment compared to the dominant humeral head fragment, and with about 1.4 cm overlap. The humeral head fragment is abducted and rotated such that the greater tuberosity is oriented posterolaterally rather than laterally. 2. Relatively nondisplaced fracture of the greater tuberosity. 3. Edema along fascia planes in the vicinity of the displaced fracture, but without a large intramuscular hematoma. There is also subcutaneous edema along the shoulder and upper arm diffusely. 4. Small right pleural  effusion. 5. Atheromatous calcification in the right subclavian and right axillary arteries. Electronically Signed   By: Van Clines M.D.   On: 05/02/2022 18:56   CT FOREARM RIGHT WO CONTRAST  Result Date: 05/02/2022 CLINICAL DATA:  Postoperative swelling, possible hematoma. Prior humeral fracture. EXAM: CT OF THE RIGHT FOREARM WITHOUT CONTRAST TECHNIQUE: Multidetector CT imaging was performed according to the standard protocol. Multiplanar CT image reconstructions were also generated. RADIATION DOSE REDUCTION: This exam was performed according to the departmental dose-optimization program which includes automated exposure control, adjustment of the mA and/or kV  according to patient size and/or use of iterative reconstruction technique. COMPARISON:  None Available. FINDINGS: Despite efforts by the technologist and patient, motion artifact is present on today's exam and could not be eliminated. This reduces exam sensitivity and specificity. Bones/Joint/Cartilage Mild spurring along the radial head. Suspected chondrocalcinosis in the wrist particularly along the TFCC disc. No definite fracture. Ligaments Suboptimally assessed by CT. Muscles and Tendons Grossly unremarkable Soft tissues Diffuse subcutaneous edema in the forearm, most confluent along the ulnar side. No well-defined drainable abscess. Substantial subcutaneous edema along the right flank. Probable small right pleural effusion. Edema tracks along the right paracolic gutter. Skin staples along the right upper buttock with likely some underlying subcutaneous hematoma on the bottom most images such as image 128 series 5. IMPRESSION: 1. Diffuse subcutaneous edema in the forearm, most confluent along the ulnar side. No well-defined drainable abscess. 2. Skin staples along the right upper buttock with likely some underlying subcutaneous hematoma on the bottom most images. 3. Probable small right pleural effusion. 4. Chondrocalcinosis in the wrist particularly along the TFCC disc, query CPPD arthropathy. 5. Motion artifact is present on today's exam and could not be eliminated. Electronically Signed   By: Van Clines M.D.   On: 05/02/2022 18:44   [place] EKG SITE RITE  Result Date: 04/30/2022 If [facility] image not attached, placement could not be confirmed due to current cardiac rhythm.  DG HIP UNILAT WITH PELVIS 2-3 VIEWS RIGHT  Result Date: 04/29/2022 CLINICAL DATA:  Right femoral intramedullary nail ORIF. Intraoperative fluoroscopy. EXAM: DG HIP (WITH OR WITHOUT PELVIS) 2-3V RIGHT COMPARISON:  Pelvis and right hip radiographs 04/28/2022 FINDINGS: Images were performed intraoperatively without the presence  of a radiologist. The patient is undergoing cephalomedullary nail fixation of the previously seen proximal femoral intertrochanteric fracture. Improved alignment. Total fluoroscopy images: 8 Total fluoroscopy time: 53 seconds Total dose: Radiation Exposure Index (as provided by the fluoroscopic device): 10.63 mGy air Kerma Please see intraoperative findings for further detail. IMPRESSION: Intraoperative fluoroscopy for cephalomedullary nail fixation of the proximal femoral intertrochanteric fracture. Electronically Signed   By: Yvonne Kendall M.D.   On: 04/29/2022 18:03   DG Humerus Right  Result Date: 04/29/2022 CLINICAL DATA:  Closed reduction of right shoulder. Intraoperative fluoroscopy. EXAM: RIGHT HUMERUS - 2+ VIEW COMPARISON:  Right shoulder radiographs 04/28/2022 FINDINGS: Images were performed intraoperatively without the presence of a radiologist. The patient is undergoing closed reduction of the prior displaced right humerus surgical neck fracture. Total fluoroscopy images: 2 Total fluoroscopy time: 13 seconds Total dose: Radiation Exposure Index (as provided by the fluoroscopic device): 0.66 mGy air Kerma Please see intraoperative findings for further detail. IMPRESSION: Intraoperative fluoroscopy provided for right humerus surgical neck fracture. Electronically Signed   By: Yvonne Kendall M.D.   On: 04/29/2022 18:01   DG C-Arm 1-60 Min-No Report  Result Date: 04/29/2022 Fluoroscopy was utilized by the requesting physician.  No radiographic interpretation.  DG C-Arm 1-60 Min-No Report  Result Date: 04/29/2022 Fluoroscopy was utilized by the requesting physician.  No radiographic interpretation.   CT Shoulder Right Wo Contrast  Result Date: 04/28/2022 CLINICAL DATA:  Shoulder trauma, fracture of humerus EXAM: CT OF THE UPPER RIGHT EXTREMITY WITHOUT CONTRAST TECHNIQUE: Multidetector CT imaging of the upper right extremity was performed according to the standard protocol. RADIATION DOSE  REDUCTION: This exam was performed according to the departmental dose-optimization program which includes automated exposure control, adjustment of the mA and/or kV according to patient size and/or use of iterative reconstruction technique. COMPARISON:  X-ray 04/28/2022 FINDINGS: Bones/Joint/Cartilage Acute comminuted fracture of the right humeral head and neck. Transversely oriented fracture component through the surgical neck is significantly displaced anteromedially relative to the humeral head. Multiple adjacent small comminuted fracture fragments. Nondisplaced fracture involves the greater tuberosity of the proximal humerus. No fracture involvement of the humeral head articular surface. Glenohumeral joint alignment is maintained without dislocation. Remaining osseous structures are intact. No additional fractures. AC joint within normal limits. Ligaments Suboptimally assessed by CT. Muscles and Tendons Preserved rotator cuff muscle bulk. Grossly intact tendons, not well evaluated by CT. Soft tissues Soft tissue swelling and ill-defined hematoma at the fracture site. No soft tissue air to suggest open fracture. No right axillary lymphadenopathy. Atherosclerotic vascular calcifications. Emphysematous changes within the lung fields with scarring or atelectasis in the right upper lobe. There is a small to moderate sized layering right-sided pleural effusion. IMPRESSION: 1. Acute comminuted fracture of the right humeral head and neck with significant displacement, as described above. 2. Small to moderate sized layering right-sided pleural effusion. Electronically Signed   By: Davina Poke D.O.   On: 04/28/2022 09:37   CT Cervical Spine Wo Contrast  Result Date: 04/28/2022 CLINICAL DATA:  Neck trauma (Age >= 65y) EXAM: CT CERVICAL SPINE WITHOUT CONTRAST TECHNIQUE: Multidetector CT imaging of the cervical spine was performed without intravenous contrast. Multiplanar CT image reconstructions were also  generated. RADIATION DOSE REDUCTION: This exam was performed according to the departmental dose-optimization program which includes automated exposure control, adjustment of the mA and/or kV according to patient size and/or use of iterative reconstruction technique. COMPARISON:  01/23/2022 FINDINGS: Alignment: Facet joints are aligned without dislocation or traumatic listhesis. Dens and lateral masses are aligned. Skull base and vertebrae: No acute fracture. No primary bone lesion or focal pathologic process. Soft tissues and spinal canal: No prevertebral fluid or swelling. No visible canal hematoma. Disc levels: Moderate-advanced multilevel degenerative disc disease, similar to prior. Upper chest: Emphysematous changes within the included lung apices. Other: Bilateral carotid atherosclerosis. IMPRESSION: No acute fracture or traumatic listhesis of the cervical spine. Electronically Signed   By: Davina Poke D.O.   On: 04/28/2022 09:30   CT Head Wo Contrast  Result Date: 04/28/2022 CLINICAL DATA:  Fall while getting out of bed this morning. Blunt head trauma. EXAM: CT HEAD WITHOUT CONTRAST TECHNIQUE: Contiguous axial images were obtained from the base of the skull through the vertex without intravenous contrast. RADIATION DOSE REDUCTION: This exam was performed according to the departmental dose-optimization program which includes automated exposure control, adjustment of the mA and/or kV according to patient size and/or use of iterative reconstruction technique. COMPARISON:  01/23/2022 FINDINGS: Brain: No evidence of intracranial hemorrhage, acute infarction, hydrocephalus, extra-axial collection, or mass lesion/mass effect. Mild diffuse cerebral atrophy and chronic small vessel disease are unchanged in appearance. Vascular:  No hyperdense vessel or other acute findings. Skull: No evidence of fracture or other significant bone  abnormality. Mild right posterior parietal scalp hematoma is noted.  Sinuses/Orbits: Near-complete opacification of both maxillary sinuses is seen. This area was not visualized on previous head CT. Other: None. IMPRESSION: Mild right posterior parietal scalp hematoma. No evidence of skull fracture. No acute intracranial abnormality. Stable mild cerebral atrophy and chronic small vessel disease. Bilateral maxillary sinusitis. Electronically Signed   By: Marlaine Hind M.D.   On: 04/28/2022 09:27   DG Shoulder Right  Result Date: 04/28/2022 CLINICAL DATA:  RIGHT humerus fracture, surgical planning. EXAM: RIGHT SHOULDER - 2+ VIEW COMPARISON:  None Available. FINDINGS: Significantly displaced transverse fracture of the RIGHT humeral neck, with anterior displacement of the distal fracture fragment and overriding of the main fracture fragments. RIGHT humeral head remains grossly well positioned relative to the glenoid fossa overlying acromioclavicular joint space is normally aligned. IMPRESSION: Significantly displaced transverse fracture of the RIGHT humeral neck, with anterior displacement of the distal fracture fragment and complete overriding of the main fracture fragments. Electronically Signed   By: Franki Cabot M.D.   On: 04/28/2022 08:58   DG Chest 1 View  Result Date: 04/28/2022 CLINICAL DATA:  87 year old female status post fall with right hip pain. EXAM: CHEST  1 VIEW COMPARISON:  Right humerus series today.  Portable chest 12/06/2021. FINDINGS: Acute displaced and over riding proximal right humerus fracture, see comparison. The proximal left humerus also appears to be abnormal, new since October although with evidence of regional periosteal new bone formation. Lung volumes and mediastinal contours remain within normal limits. Extensive Calcified aortic atherosclerosis. Visualized tracheal air column is within normal limits. Allowing for portable technique the lungs are clear. No pneumothorax or pleural effusion. Cholecystectomy clips. No rib fracture identified. Negative  visible bowel gas. IMPRESSION: 1. No acute cardiopulmonary abnormality. 2. Acute displaced and over riding proximal right humerus fracture, see comparison. 3. Proximal left humerus also appears to be abnormal, new since October but with periosteal bone formation suggesting a nonacute fracture. 4.  Aortic Atherosclerosis (ICD10-I70.0). Electronically Signed   By: Genevie Ann M.D.   On: 04/28/2022 08:18   DG Humerus Right  Result Date: 04/28/2022 CLINICAL DATA:  87 year old female status post fall with right hip pain. EXAM: RIGHT HUMERUS - 2+ VIEW COMPARISON:  Portable chest x-ray 12/06/2021. FINDINGS: Transverse fracture of the proximal right humerus appears to be through the neck and there is greater than 1 full shaft width anterior displacement and over-riding the entire with of the humeral head, at least 5 cm. The distal humerus fragment is nearly impacted on the coracoid. The right humeral head appears to remain aligned with the glenoid. The more distal right humerus and right elbow appear grossly intact. Visible right ribs appear to remain intact. Stable right lung aeration. Extensive right axillary calcified atherosclerosis. IMPRESSION: Acute transverse fracture of the proximal right humerus neck, with complete anterior displacement and over riding of the humeral head such that the distal fracture fragment is nearly impacted on the coracoid. Electronically Signed   By: Genevie Ann M.D.   On: 04/28/2022 08:15   DG Hip Unilat W or Wo Pelvis 2-3 Views Right  Result Date: 04/28/2022 CLINICAL DATA:  87 year old female status post fall with right hip pain. EXAM: DG HIP (WITH OR WITHOUT PELVIS) 2-3V RIGHT COMPARISON:  Pelvis radiograph 12/06/2021. Pelvis CT 12/06/2021. FINDINGS: Highly comminuted right femur intertrochanteric fracture with varus impaction. Right femoral head appears to remain normally located. Lesser and greater trochanter butterfly fragments. There also appear to be bilateral pubic rami fractures.  And although these were not apparent on the prior radiograph, nondisplaced ramus fractures were demonstrated on the CT at that time. Left femoral head remains normally located. Grossly intact proximal left femur. Pelvis elsewhere appears intact. Extensive aortoiliac and femoral calcified atherosclerosis. Negative visible bowel gas. IMPRESSION: 1. Acute highly comminuted right femur intertrochanteric fracture with varus impaction. 2. Nonacute bilateral pubic rami fractures, nondisplaced and subtle on imaging in October. 3. Extensive calcified atherosclerosis. Electronically Signed   By: Genevie Ann M.D.   On: 04/28/2022 08:13    Labs: Basic Metabolic Panel: Recent Labs  Lab 05/01/22 0324 05/02/22 0628 05/04/22 0535 05/07/22 0355  NA 133* 134* 134* 134*  K 3.8 4.4 4.1 4.6  CL 106 107 107 105  CO2 21* '23 22 23  '$ GLUCOSE 122* 69* 78 87  BUN 40* 42* 41* 41*  CREATININE 1.50* 1.41* 1.13* 1.08*  CALCIUM 8.3* 8.5* 8.6* 9.0   CBC: Recent Labs  Lab 05/03/22 1806 05/04/22 0535 05/05/22 0650 05/06/22 0900 05/06/22 1935 05/07/22 0355  WBC 9.9 9.0 10.2 9.9  --  10.8*  NEUTROABS 7.5  --   --   --   --   --   HGB 10.3* 9.1* 8.1* 7.3* 9.6* 9.2*  HCT 31.0* 26.9* 24.3* 22.5* 29.0* 27.9*  MCV 93.7 93.7 94.9 97.0  --  94.3  PLT 146* 138* 150 161  --  171   Microbiology: Results for orders placed or performed during the hospital encounter of 04/28/22  Surgical PCR screen     Status: Abnormal   Collection Time: 04/28/22 10:39 PM   Specimen: Nasal Mucosa; Nasal Swab  Result Value Ref Range Status   MRSA, PCR POSITIVE (A) NEGATIVE Final    Comment: RESULT CALLED TO, READ BACK BY AND VERIFIED WITH: WILACYNT STOVER 04/29/2022 AT 0445 SRR    Staphylococcus aureus POSITIVE (A) NEGATIVE Final    Comment: (NOTE) The Xpert SA Assay (FDA approved for NASAL specimens in patients 45 years of age and older), is one component of a comprehensive surveillance program. It is not intended to diagnose infection nor  to guide or monitor treatment. Performed at [facility], [address], [place], [place] [address]    Time coordinating discharge: Over 30 minutes  Richarda Osmond, MD  [facility] 05/07/2022, 10:42 AM

## 2022-05-10 DIAGNOSIS — K219 Gastro-esophageal reflux disease without esophagitis: Secondary | ICD-10-CM | POA: Diagnosis not present

## 2022-05-10 DIAGNOSIS — I129 Hypertensive chronic kidney disease with stage 1 through stage 4 chronic kidney disease, or unspecified chronic kidney disease: Secondary | ICD-10-CM | POA: Diagnosis not present

## 2022-05-10 DIAGNOSIS — D508 Other iron deficiency anemias: Secondary | ICD-10-CM | POA: Diagnosis not present

## 2022-05-10 DIAGNOSIS — N183 Chronic kidney disease, stage 3 unspecified: Secondary | ICD-10-CM | POA: Diagnosis not present

## 2022-05-10 DIAGNOSIS — K5904 Chronic idiopathic constipation: Secondary | ICD-10-CM | POA: Diagnosis not present

## 2022-05-10 DIAGNOSIS — M6281 Muscle weakness (generalized): Secondary | ICD-10-CM | POA: Diagnosis not present

## 2022-05-10 DIAGNOSIS — S32519D Fracture of superior rim of unspecified pubis, subsequent encounter for fracture with routine healing: Secondary | ICD-10-CM | POA: Diagnosis not present

## 2022-05-10 DIAGNOSIS — S42201D Unspecified fracture of upper end of right humerus, subsequent encounter for fracture with routine healing: Secondary | ICD-10-CM | POA: Diagnosis not present

## 2022-05-10 DIAGNOSIS — S7291XD Unspecified fracture of right femur, subsequent encounter for closed fracture with routine healing: Secondary | ICD-10-CM | POA: Diagnosis not present

## 2022-05-10 DIAGNOSIS — R296 Repeated falls: Secondary | ICD-10-CM | POA: Diagnosis not present

## 2022-05-10 DIAGNOSIS — E038 Other specified hypothyroidism: Secondary | ICD-10-CM | POA: Diagnosis not present

## 2022-05-13 DIAGNOSIS — M6281 Muscle weakness (generalized): Secondary | ICD-10-CM | POA: Diagnosis not present

## 2022-05-13 DIAGNOSIS — I129 Hypertensive chronic kidney disease with stage 1 through stage 4 chronic kidney disease, or unspecified chronic kidney disease: Secondary | ICD-10-CM | POA: Diagnosis not present

## 2022-05-13 DIAGNOSIS — K219 Gastro-esophageal reflux disease without esophagitis: Secondary | ICD-10-CM | POA: Diagnosis not present

## 2022-05-13 DIAGNOSIS — S32519D Fracture of superior rim of unspecified pubis, subsequent encounter for fracture with routine healing: Secondary | ICD-10-CM | POA: Diagnosis not present

## 2022-05-13 DIAGNOSIS — K5904 Chronic idiopathic constipation: Secondary | ICD-10-CM | POA: Diagnosis not present

## 2022-05-13 DIAGNOSIS — S7291XD Unspecified fracture of right femur, subsequent encounter for closed fracture with routine healing: Secondary | ICD-10-CM | POA: Diagnosis not present

## 2022-05-13 DIAGNOSIS — D508 Other iron deficiency anemias: Secondary | ICD-10-CM | POA: Diagnosis not present

## 2022-05-13 DIAGNOSIS — S42201D Unspecified fracture of upper end of right humerus, subsequent encounter for fracture with routine healing: Secondary | ICD-10-CM | POA: Diagnosis not present

## 2022-05-13 DIAGNOSIS — N183 Chronic kidney disease, stage 3 unspecified: Secondary | ICD-10-CM | POA: Diagnosis not present

## 2022-05-13 DIAGNOSIS — E038 Other specified hypothyroidism: Secondary | ICD-10-CM | POA: Diagnosis not present

## 2022-05-13 DIAGNOSIS — S72141A Displaced intertrochanteric fracture of right femur, initial encounter for closed fracture: Secondary | ICD-10-CM | POA: Diagnosis not present

## 2022-05-13 DIAGNOSIS — S42211A Unspecified displaced fracture of surgical neck of right humerus, initial encounter for closed fracture: Secondary | ICD-10-CM | POA: Diagnosis not present

## 2022-05-13 DIAGNOSIS — R296 Repeated falls: Secondary | ICD-10-CM | POA: Diagnosis not present

## 2022-05-20 DIAGNOSIS — S7291XD Unspecified fracture of right femur, subsequent encounter for closed fracture with routine healing: Secondary | ICD-10-CM | POA: Diagnosis not present

## 2022-05-20 DIAGNOSIS — N183 Chronic kidney disease, stage 3 unspecified: Secondary | ICD-10-CM | POA: Diagnosis not present

## 2022-05-20 DIAGNOSIS — S32519D Fracture of superior rim of unspecified pubis, subsequent encounter for fracture with routine healing: Secondary | ICD-10-CM | POA: Diagnosis not present

## 2022-05-20 DIAGNOSIS — K219 Gastro-esophageal reflux disease without esophagitis: Secondary | ICD-10-CM | POA: Diagnosis not present

## 2022-05-20 DIAGNOSIS — M6281 Muscle weakness (generalized): Secondary | ICD-10-CM | POA: Diagnosis not present

## 2022-05-20 DIAGNOSIS — D508 Other iron deficiency anemias: Secondary | ICD-10-CM | POA: Diagnosis not present

## 2022-05-20 DIAGNOSIS — E038 Other specified hypothyroidism: Secondary | ICD-10-CM | POA: Diagnosis not present

## 2022-05-20 DIAGNOSIS — R296 Repeated falls: Secondary | ICD-10-CM | POA: Diagnosis not present

## 2022-05-20 DIAGNOSIS — K5904 Chronic idiopathic constipation: Secondary | ICD-10-CM | POA: Diagnosis not present

## 2022-05-20 DIAGNOSIS — I129 Hypertensive chronic kidney disease with stage 1 through stage 4 chronic kidney disease, or unspecified chronic kidney disease: Secondary | ICD-10-CM | POA: Diagnosis not present

## 2022-05-20 DIAGNOSIS — S42201D Unspecified fracture of upper end of right humerus, subsequent encounter for fracture with routine healing: Secondary | ICD-10-CM | POA: Diagnosis not present

## 2022-05-21 ENCOUNTER — Telehealth (INDEPENDENT_AMBULATORY_CARE_PROVIDER_SITE_OTHER): Payer: Self-pay | Admitting: Vascular Surgery

## 2022-05-21 DIAGNOSIS — S32519D Fracture of superior rim of unspecified pubis, subsequent encounter for fracture with routine healing: Secondary | ICD-10-CM | POA: Diagnosis not present

## 2022-05-21 DIAGNOSIS — I129 Hypertensive chronic kidney disease with stage 1 through stage 4 chronic kidney disease, or unspecified chronic kidney disease: Secondary | ICD-10-CM | POA: Diagnosis not present

## 2022-05-21 DIAGNOSIS — M6281 Muscle weakness (generalized): Secondary | ICD-10-CM | POA: Diagnosis not present

## 2022-05-21 DIAGNOSIS — S42201D Unspecified fracture of upper end of right humerus, subsequent encounter for fracture with routine healing: Secondary | ICD-10-CM | POA: Diagnosis not present

## 2022-05-21 DIAGNOSIS — K5904 Chronic idiopathic constipation: Secondary | ICD-10-CM | POA: Diagnosis not present

## 2022-05-21 DIAGNOSIS — K219 Gastro-esophageal reflux disease without esophagitis: Secondary | ICD-10-CM | POA: Diagnosis not present

## 2022-05-21 DIAGNOSIS — N183 Chronic kidney disease, stage 3 unspecified: Secondary | ICD-10-CM | POA: Diagnosis not present

## 2022-05-21 DIAGNOSIS — S7291XD Unspecified fracture of right femur, subsequent encounter for closed fracture with routine healing: Secondary | ICD-10-CM | POA: Diagnosis not present

## 2022-05-21 DIAGNOSIS — E038 Other specified hypothyroidism: Secondary | ICD-10-CM | POA: Diagnosis not present

## 2022-05-21 DIAGNOSIS — D508 Other iron deficiency anemias: Secondary | ICD-10-CM | POA: Diagnosis not present

## 2022-05-21 NOTE — Telephone Encounter (Signed)
Spoke to pt's niece (POA) stating that she wanted to cancel the appt for pt on 4.11.24. States that she is in [facility] and they follow her health. She inquired what the appt was for and I explained that it was an [place] to monitor the AAA and get the results from Dr. Delana Meyer. She stated that she was going to cancel and I suggested that she let [facility] know that they had cancelled the appt so they could start monitoring the AAA. Nothing further needed at this time.

## 2022-05-27 DIAGNOSIS — S40011D Contusion of right shoulder, subsequent encounter: Secondary | ICD-10-CM | POA: Diagnosis not present

## 2022-05-27 DIAGNOSIS — S40021D Contusion of right upper arm, subsequent encounter: Secondary | ICD-10-CM | POA: Diagnosis not present

## 2022-05-27 DIAGNOSIS — R2689 Other abnormalities of gait and mobility: Secondary | ICD-10-CM | POA: Diagnosis not present

## 2022-05-27 DIAGNOSIS — M6281 Muscle weakness (generalized): Secondary | ICD-10-CM | POA: Diagnosis not present

## 2022-05-27 DIAGNOSIS — R600 Localized edema: Secondary | ICD-10-CM | POA: Diagnosis not present

## 2022-05-28 DIAGNOSIS — S32401D Unspecified fracture of right acetabulum, subsequent encounter for fracture with routine healing: Secondary | ICD-10-CM | POA: Diagnosis not present

## 2022-05-29 DIAGNOSIS — R2689 Other abnormalities of gait and mobility: Secondary | ICD-10-CM | POA: Diagnosis not present

## 2022-05-29 DIAGNOSIS — M6281 Muscle weakness (generalized): Secondary | ICD-10-CM | POA: Diagnosis not present

## 2022-05-29 DIAGNOSIS — Z79899 Other long term (current) drug therapy: Secondary | ICD-10-CM | POA: Diagnosis not present

## 2022-05-30 DIAGNOSIS — M6281 Muscle weakness (generalized): Secondary | ICD-10-CM | POA: Diagnosis not present

## 2022-05-30 DIAGNOSIS — R2689 Other abnormalities of gait and mobility: Secondary | ICD-10-CM | POA: Diagnosis not present

## 2022-05-31 DIAGNOSIS — M6281 Muscle weakness (generalized): Secondary | ICD-10-CM | POA: Diagnosis not present

## 2022-05-31 DIAGNOSIS — K219 Gastro-esophageal reflux disease without esophagitis: Secondary | ICD-10-CM | POA: Diagnosis not present

## 2022-05-31 DIAGNOSIS — E038 Other specified hypothyroidism: Secondary | ICD-10-CM | POA: Diagnosis not present

## 2022-06-03 ENCOUNTER — Other Ambulatory Visit (INDEPENDENT_AMBULATORY_CARE_PROVIDER_SITE_OTHER): Payer: Medicare Other

## 2022-06-03 ENCOUNTER — Ambulatory Visit (INDEPENDENT_AMBULATORY_CARE_PROVIDER_SITE_OTHER): Payer: Medicare Other | Admitting: Vascular Surgery

## 2022-06-03 DIAGNOSIS — R2689 Other abnormalities of gait and mobility: Secondary | ICD-10-CM | POA: Diagnosis not present

## 2022-06-03 DIAGNOSIS — M6281 Muscle weakness (generalized): Secondary | ICD-10-CM | POA: Diagnosis not present

## 2022-06-06 DIAGNOSIS — R2689 Other abnormalities of gait and mobility: Secondary | ICD-10-CM | POA: Diagnosis not present

## 2022-06-06 DIAGNOSIS — M6281 Muscle weakness (generalized): Secondary | ICD-10-CM | POA: Diagnosis not present

## 2022-06-07 DIAGNOSIS — R2689 Other abnormalities of gait and mobility: Secondary | ICD-10-CM | POA: Diagnosis not present

## 2022-06-07 DIAGNOSIS — I129 Hypertensive chronic kidney disease with stage 1 through stage 4 chronic kidney disease, or unspecified chronic kidney disease: Secondary | ICD-10-CM | POA: Diagnosis not present

## 2022-06-07 DIAGNOSIS — S40011D Contusion of right shoulder, subsequent encounter: Secondary | ICD-10-CM | POA: Diagnosis not present

## 2022-06-07 DIAGNOSIS — S7291XD Unspecified fracture of right femur, subsequent encounter for closed fracture with routine healing: Secondary | ICD-10-CM | POA: Diagnosis not present

## 2022-06-07 DIAGNOSIS — S40021D Contusion of right upper arm, subsequent encounter: Secondary | ICD-10-CM | POA: Diagnosis not present

## 2022-06-07 DIAGNOSIS — S32519D Fracture of superior rim of unspecified pubis, subsequent encounter for fracture with routine healing: Secondary | ICD-10-CM | POA: Diagnosis not present

## 2022-06-07 DIAGNOSIS — S42201D Unspecified fracture of upper end of right humerus, subsequent encounter for fracture with routine healing: Secondary | ICD-10-CM | POA: Diagnosis not present

## 2022-06-07 DIAGNOSIS — M6281 Muscle weakness (generalized): Secondary | ICD-10-CM | POA: Diagnosis not present

## 2022-06-10 DIAGNOSIS — R2689 Other abnormalities of gait and mobility: Secondary | ICD-10-CM | POA: Diagnosis not present

## 2022-06-10 DIAGNOSIS — M6281 Muscle weakness (generalized): Secondary | ICD-10-CM | POA: Diagnosis not present

## 2022-06-12 DIAGNOSIS — K5904 Chronic idiopathic constipation: Secondary | ICD-10-CM | POA: Diagnosis not present

## 2022-06-12 DIAGNOSIS — R2689 Other abnormalities of gait and mobility: Secondary | ICD-10-CM | POA: Diagnosis not present

## 2022-06-12 DIAGNOSIS — I10 Essential (primary) hypertension: Secondary | ICD-10-CM | POA: Diagnosis not present

## 2022-06-12 DIAGNOSIS — K219 Gastro-esophageal reflux disease without esophagitis: Secondary | ICD-10-CM | POA: Diagnosis not present

## 2022-06-12 DIAGNOSIS — M6281 Muscle weakness (generalized): Secondary | ICD-10-CM | POA: Diagnosis not present

## 2022-06-13 ENCOUNTER — Ambulatory Visit (INDEPENDENT_AMBULATORY_CARE_PROVIDER_SITE_OTHER): Payer: Medicare Other | Admitting: Vascular Surgery

## 2022-06-13 ENCOUNTER — Other Ambulatory Visit (INDEPENDENT_AMBULATORY_CARE_PROVIDER_SITE_OTHER): Payer: Medicare Other

## 2022-06-17 DIAGNOSIS — R2689 Other abnormalities of gait and mobility: Secondary | ICD-10-CM | POA: Diagnosis not present

## 2022-06-17 DIAGNOSIS — M6281 Muscle weakness (generalized): Secondary | ICD-10-CM | POA: Diagnosis not present

## 2022-06-18 DIAGNOSIS — R2689 Other abnormalities of gait and mobility: Secondary | ICD-10-CM | POA: Diagnosis not present

## 2022-06-18 DIAGNOSIS — M6281 Muscle weakness (generalized): Secondary | ICD-10-CM | POA: Diagnosis not present

## 2022-06-19 DIAGNOSIS — S42201D Unspecified fracture of upper end of right humerus, subsequent encounter for fracture with routine healing: Secondary | ICD-10-CM | POA: Diagnosis not present

## 2022-06-19 DIAGNOSIS — S32519D Fracture of superior rim of unspecified pubis, subsequent encounter for fracture with routine healing: Secondary | ICD-10-CM | POA: Diagnosis not present

## 2022-06-19 DIAGNOSIS — M6281 Muscle weakness (generalized): Secondary | ICD-10-CM | POA: Diagnosis not present

## 2022-06-21 DIAGNOSIS — R2689 Other abnormalities of gait and mobility: Secondary | ICD-10-CM | POA: Diagnosis not present

## 2022-06-21 DIAGNOSIS — M6281 Muscle weakness (generalized): Secondary | ICD-10-CM | POA: Diagnosis not present

## 2022-06-24 DIAGNOSIS — R2689 Other abnormalities of gait and mobility: Secondary | ICD-10-CM | POA: Diagnosis not present

## 2022-06-24 DIAGNOSIS — M6281 Muscle weakness (generalized): Secondary | ICD-10-CM | POA: Diagnosis not present

## 2022-06-25 DIAGNOSIS — M6281 Muscle weakness (generalized): Secondary | ICD-10-CM | POA: Diagnosis not present

## 2022-06-25 DIAGNOSIS — N183 Chronic kidney disease, stage 3 unspecified: Secondary | ICD-10-CM | POA: Diagnosis not present

## 2022-06-25 DIAGNOSIS — E038 Other specified hypothyroidism: Secondary | ICD-10-CM | POA: Diagnosis not present

## 2022-06-25 DIAGNOSIS — R2689 Other abnormalities of gait and mobility: Secondary | ICD-10-CM | POA: Diagnosis not present

## 2022-06-25 DIAGNOSIS — I129 Hypertensive chronic kidney disease with stage 1 through stage 4 chronic kidney disease, or unspecified chronic kidney disease: Secondary | ICD-10-CM | POA: Diagnosis not present

## 2022-06-26 DIAGNOSIS — R2689 Other abnormalities of gait and mobility: Secondary | ICD-10-CM | POA: Diagnosis not present

## 2022-06-26 DIAGNOSIS — M6281 Muscle weakness (generalized): Secondary | ICD-10-CM | POA: Diagnosis not present

## 2022-06-27 DIAGNOSIS — R2689 Other abnormalities of gait and mobility: Secondary | ICD-10-CM | POA: Diagnosis not present

## 2022-06-27 DIAGNOSIS — M6281 Muscle weakness (generalized): Secondary | ICD-10-CM | POA: Diagnosis not present

## 2022-06-28 DIAGNOSIS — S32401D Unspecified fracture of right acetabulum, subsequent encounter for fracture with routine healing: Secondary | ICD-10-CM | POA: Diagnosis not present

## 2022-07-02 DIAGNOSIS — R2689 Other abnormalities of gait and mobility: Secondary | ICD-10-CM | POA: Diagnosis not present

## 2022-07-02 DIAGNOSIS — M6281 Muscle weakness (generalized): Secondary | ICD-10-CM | POA: Diagnosis not present

## 2022-07-04 DIAGNOSIS — R2689 Other abnormalities of gait and mobility: Secondary | ICD-10-CM | POA: Diagnosis not present

## 2022-07-04 DIAGNOSIS — M6281 Muscle weakness (generalized): Secondary | ICD-10-CM | POA: Diagnosis not present

## 2022-07-05 DIAGNOSIS — M6281 Muscle weakness (generalized): Secondary | ICD-10-CM | POA: Diagnosis not present

## 2022-07-05 DIAGNOSIS — R2689 Other abnormalities of gait and mobility: Secondary | ICD-10-CM | POA: Diagnosis not present

## 2022-07-09 DIAGNOSIS — M6281 Muscle weakness (generalized): Secondary | ICD-10-CM | POA: Diagnosis not present

## 2022-07-09 DIAGNOSIS — R2689 Other abnormalities of gait and mobility: Secondary | ICD-10-CM | POA: Diagnosis not present

## 2022-07-12 DIAGNOSIS — N183 Chronic kidney disease, stage 3 unspecified: Secondary | ICD-10-CM | POA: Diagnosis not present

## 2022-07-12 DIAGNOSIS — M6281 Muscle weakness (generalized): Secondary | ICD-10-CM | POA: Diagnosis not present

## 2022-07-12 DIAGNOSIS — R2689 Other abnormalities of gait and mobility: Secondary | ICD-10-CM | POA: Diagnosis not present

## 2022-07-12 DIAGNOSIS — I129 Hypertensive chronic kidney disease with stage 1 through stage 4 chronic kidney disease, or unspecified chronic kidney disease: Secondary | ICD-10-CM | POA: Diagnosis not present

## 2022-07-12 DIAGNOSIS — D508 Other iron deficiency anemias: Secondary | ICD-10-CM | POA: Diagnosis not present

## 2022-07-12 DIAGNOSIS — E038 Other specified hypothyroidism: Secondary | ICD-10-CM | POA: Diagnosis not present

## 2022-07-15 DIAGNOSIS — I739 Peripheral vascular disease, unspecified: Secondary | ICD-10-CM | POA: Diagnosis not present

## 2022-07-15 DIAGNOSIS — R2689 Other abnormalities of gait and mobility: Secondary | ICD-10-CM | POA: Diagnosis not present

## 2022-07-15 DIAGNOSIS — W050XXA Fall from non-moving wheelchair, initial encounter: Secondary | ICD-10-CM | POA: Diagnosis not present

## 2022-07-15 DIAGNOSIS — R296 Repeated falls: Secondary | ICD-10-CM | POA: Diagnosis not present

## 2022-07-15 DIAGNOSIS — M6281 Muscle weakness (generalized): Secondary | ICD-10-CM | POA: Diagnosis not present

## 2022-07-15 DIAGNOSIS — L603 Nail dystrophy: Secondary | ICD-10-CM | POA: Diagnosis not present

## 2022-07-16 DIAGNOSIS — R2689 Other abnormalities of gait and mobility: Secondary | ICD-10-CM | POA: Diagnosis not present

## 2022-07-16 DIAGNOSIS — M6281 Muscle weakness (generalized): Secondary | ICD-10-CM | POA: Diagnosis not present

## 2022-07-17 DIAGNOSIS — R2689 Other abnormalities of gait and mobility: Secondary | ICD-10-CM | POA: Diagnosis not present

## 2022-07-17 DIAGNOSIS — M6281 Muscle weakness (generalized): Secondary | ICD-10-CM | POA: Diagnosis not present

## 2022-07-18 DIAGNOSIS — M6281 Muscle weakness (generalized): Secondary | ICD-10-CM | POA: Diagnosis not present

## 2022-07-18 DIAGNOSIS — R2689 Other abnormalities of gait and mobility: Secondary | ICD-10-CM | POA: Diagnosis not present

## 2022-07-19 DIAGNOSIS — S32519D Fracture of superior rim of unspecified pubis, subsequent encounter for fracture with routine healing: Secondary | ICD-10-CM | POA: Diagnosis not present

## 2022-07-19 DIAGNOSIS — G894 Chronic pain syndrome: Secondary | ICD-10-CM | POA: Diagnosis not present

## 2022-07-19 DIAGNOSIS — S42201D Unspecified fracture of upper end of right humerus, subsequent encounter for fracture with routine healing: Secondary | ICD-10-CM | POA: Diagnosis not present

## 2022-07-23 DIAGNOSIS — D508 Other iron deficiency anemias: Secondary | ICD-10-CM | POA: Diagnosis not present

## 2022-07-23 DIAGNOSIS — G894 Chronic pain syndrome: Secondary | ICD-10-CM | POA: Diagnosis not present

## 2022-07-24 DIAGNOSIS — M6281 Muscle weakness (generalized): Secondary | ICD-10-CM | POA: Diagnosis not present

## 2022-07-24 DIAGNOSIS — N183 Chronic kidney disease, stage 3 unspecified: Secondary | ICD-10-CM | POA: Diagnosis not present

## 2022-07-24 DIAGNOSIS — D649 Anemia, unspecified: Secondary | ICD-10-CM | POA: Diagnosis not present

## 2022-07-24 DIAGNOSIS — K219 Gastro-esophageal reflux disease without esophagitis: Secondary | ICD-10-CM | POA: Diagnosis not present

## 2022-07-24 DIAGNOSIS — E039 Hypothyroidism, unspecified: Secondary | ICD-10-CM | POA: Diagnosis not present

## 2022-07-28 DIAGNOSIS — S32401D Unspecified fracture of right acetabulum, subsequent encounter for fracture with routine healing: Secondary | ICD-10-CM | POA: Diagnosis not present

## 2022-07-31 DIAGNOSIS — S42211A Unspecified displaced fracture of surgical neck of right humerus, initial encounter for closed fracture: Secondary | ICD-10-CM | POA: Diagnosis not present

## 2022-07-31 DIAGNOSIS — S72141A Displaced intertrochanteric fracture of right femur, initial encounter for closed fracture: Secondary | ICD-10-CM | POA: Diagnosis not present
